# Patient Record
Sex: Female | Born: 1990 | Race: White | Hispanic: No | Marital: Married | Smoking: Former smoker
Health system: Southern US, Community
[De-identification: ages and names within clinical notes are randomized; demographics above are authoritative.]

## PROBLEM LIST (undated history)

## (undated) ENCOUNTER — Inpatient Hospital Stay: Payer: Self-pay

## (undated) ENCOUNTER — Ambulatory Visit: Admission: EM | Payer: Medicaid Other | Source: Home / Self Care

## (undated) DIAGNOSIS — E079 Disorder of thyroid, unspecified: Secondary | ICD-10-CM

## (undated) DIAGNOSIS — N3010 Interstitial cystitis (chronic) without hematuria: Secondary | ICD-10-CM

## (undated) DIAGNOSIS — K219 Gastro-esophageal reflux disease without esophagitis: Secondary | ICD-10-CM

## (undated) DIAGNOSIS — I10 Essential (primary) hypertension: Secondary | ICD-10-CM

## (undated) DIAGNOSIS — Z803 Family history of malignant neoplasm of breast: Secondary | ICD-10-CM

## (undated) DIAGNOSIS — J039 Acute tonsillitis, unspecified: Secondary | ICD-10-CM

## (undated) DIAGNOSIS — B999 Unspecified infectious disease: Secondary | ICD-10-CM

## (undated) DIAGNOSIS — F419 Anxiety disorder, unspecified: Secondary | ICD-10-CM

## (undated) DIAGNOSIS — K2900 Acute gastritis without bleeding: Secondary | ICD-10-CM

## (undated) DIAGNOSIS — F909 Attention-deficit hyperactivity disorder, unspecified type: Secondary | ICD-10-CM

## (undated) DIAGNOSIS — R51 Headache: Secondary | ICD-10-CM

## (undated) DIAGNOSIS — N189 Chronic kidney disease, unspecified: Secondary | ICD-10-CM

## (undated) DIAGNOSIS — D649 Anemia, unspecified: Secondary | ICD-10-CM

## (undated) DIAGNOSIS — R519 Headache, unspecified: Secondary | ICD-10-CM

## (undated) DIAGNOSIS — E041 Nontoxic single thyroid nodule: Secondary | ICD-10-CM

## (undated) DIAGNOSIS — T7840XA Allergy, unspecified, initial encounter: Secondary | ICD-10-CM

## (undated) HISTORY — DX: Disorder of thyroid, unspecified: E07.9

## (undated) HISTORY — DX: Family history of malignant neoplasm of breast: Z80.3

## (undated) HISTORY — PX: BREAST SURGERY: SHX581

## (undated) HISTORY — PX: WISDOM TOOTH EXTRACTION: SHX21

## (undated) HISTORY — DX: Anemia, unspecified: D64.9

## (undated) HISTORY — DX: Allergy, unspecified, initial encounter: T78.40XA

## (undated) HISTORY — DX: Interstitial cystitis (chronic) without hematuria: N30.10

## (undated) HISTORY — DX: Acute gastritis without bleeding: K29.00

---
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
## 2010-09-11 ENCOUNTER — Emergency Department: Payer: Self-pay | Admitting: Unknown Physician Specialty

## 2010-12-19 ENCOUNTER — Emergency Department: Payer: Self-pay | Admitting: Emergency Medicine

## 2011-02-08 ENCOUNTER — Ambulatory Visit: Payer: Self-pay | Admitting: Internal Medicine

## 2011-02-16 ENCOUNTER — Emergency Department: Payer: Self-pay | Admitting: Emergency Medicine

## 2011-07-10 ENCOUNTER — Observation Stay: Payer: Self-pay

## 2011-07-11 ENCOUNTER — Inpatient Hospital Stay: Payer: Self-pay

## 2011-07-11 ENCOUNTER — Observation Stay: Payer: Self-pay

## 2012-07-30 ENCOUNTER — Emergency Department: Payer: Self-pay | Admitting: *Deleted

## 2012-07-30 LAB — URINALYSIS, COMPLETE
Ketone: NEGATIVE
Ph: 7 (ref 4.5–8.0)
Protein: NEGATIVE
RBC,UR: NONE SEEN /HPF (ref 0–5)

## 2012-07-31 LAB — CBC
HCT: 38 % (ref 35.0–47.0)
HGB: 12.6 g/dL (ref 12.0–16.0)
MCHC: 33.1 g/dL (ref 32.0–36.0)
Platelet: 207 10*3/uL (ref 150–440)
RBC: 4.59 10*6/uL (ref 3.80–5.20)

## 2012-07-31 LAB — COMPREHENSIVE METABOLIC PANEL
Albumin: 3.9 g/dL (ref 3.4–5.0)
Alkaline Phosphatase: 64 U/L (ref 50–136)
BUN: 8 mg/dL (ref 7–18)
Bilirubin,Total: 0.4 mg/dL (ref 0.2–1.0)
Calcium, Total: 8.7 mg/dL (ref 8.5–10.1)
Chloride: 105 mmol/L (ref 98–107)
Creatinine: 0.72 mg/dL (ref 0.60–1.30)
EGFR (African American): >60
Glucose: 102 mg/dL — ABNORMAL HIGH (ref 65–99)
SGOT(AST): 28 U/L (ref 15–37)
SGPT (ALT): 20 U/L (ref 12–78)
Total Protein: 7.7 g/dL (ref 6.4–8.2)

## 2012-11-18 ENCOUNTER — Emergency Department: Payer: Self-pay | Admitting: Emergency Medicine

## 2012-11-18 ENCOUNTER — Ambulatory Visit: Payer: Self-pay | Admitting: Medical

## 2012-11-18 LAB — URINALYSIS, COMPLETE

## 2012-11-18 LAB — COMPREHENSIVE METABOLIC PANEL
Albumin: 4.6 g/dL (ref 3.4–5.0)
Anion Gap: 10 (ref 7–16)
Bilirubin,Total: 0.6 mg/dL (ref 0.2–1.0)
Calcium, Total: 9 mg/dL (ref 8.5–10.1)
Creatinine: 0.69 mg/dL (ref 0.60–1.30)
EGFR (Non-African Amer.): >60
Glucose: 118 mg/dL — ABNORMAL HIGH (ref 65–99)
Osmolality: 276 (ref 275–301)
Potassium: 3.3 mmol/L — ABNORMAL LOW (ref 3.5–5.1)
Sodium: 139 mmol/L (ref 136–145)
Total Protein: 8.6 g/dL — ABNORMAL HIGH (ref 6.4–8.2)

## 2012-11-18 LAB — CBC
HCT: 40.7 % (ref 35.0–47.0)
MCH: 28.4 pg (ref 26.0–34.0)
Platelet: 216 10*3/uL (ref 150–440)
RBC: 4.83 10*6/uL (ref 3.80–5.20)
WBC: 7.6 10*3/uL (ref 3.6–11.0)

## 2012-11-18 LAB — PREGNANCY, URINE: Pregnancy Test, Urine: NEGATIVE m[IU]/mL

## 2012-11-18 LAB — CK TOTAL AND CKMB (NOT AT ARMC): CK, Total: 199 U/L (ref 21–215)

## 2012-11-18 LAB — TROPONIN I: Troponin-I: <0.02 ng/mL

## 2013-01-26 ENCOUNTER — Ambulatory Visit: Payer: Self-pay | Admitting: Emergency Medicine

## 2013-01-26 LAB — RAPID STREP-A WITH REFLX: Micro Text Report: NEGATIVE

## 2013-01-26 LAB — RAPID INFLUENZA A&B ANTIGENS

## 2013-01-28 LAB — BETA STREP CULTURE(ARMC)

## 2013-03-20 ENCOUNTER — Emergency Department: Payer: Self-pay | Admitting: Emergency Medicine

## 2014-06-25 ENCOUNTER — Encounter (HOSPITAL_COMMUNITY): Payer: Self-pay | Admitting: Emergency Medicine

## 2014-06-25 ENCOUNTER — Emergency Department (HOSPITAL_COMMUNITY)
Admission: EM | Admit: 2014-06-25 | Discharge: 2014-06-25 | Disposition: A | Discharge status: Home / Self Care | Payer: Self-pay | Attending: Emergency Medicine | Admitting: Emergency Medicine

## 2014-06-25 ENCOUNTER — Emergency Department (HOSPITAL_COMMUNITY): Payer: Self-pay

## 2014-06-25 DIAGNOSIS — M940 Chondrocostal junction syndrome [Tietze]: Secondary | ICD-10-CM | POA: Insufficient documentation

## 2014-06-25 DIAGNOSIS — Z3202 Encounter for pregnancy test, result negative: Secondary | ICD-10-CM | POA: Insufficient documentation

## 2014-06-25 DIAGNOSIS — F411 Generalized anxiety disorder: Secondary | ICD-10-CM | POA: Insufficient documentation

## 2014-06-25 DIAGNOSIS — R109 Unspecified abdominal pain: Secondary | ICD-10-CM | POA: Insufficient documentation

## 2014-06-25 DIAGNOSIS — Z79899 Other long term (current) drug therapy: Secondary | ICD-10-CM | POA: Insufficient documentation

## 2014-06-25 HISTORY — DX: Anxiety disorder, unspecified: F41.9

## 2014-06-25 LAB — COMPREHENSIVE METABOLIC PANEL
ALBUMIN: 4.2 g/dL (ref 3.5–5.2)
ALT: 24 U/L (ref 0–35)
ANION GAP: 14 (ref 5–15)
AST: 26 U/L (ref 0–37)
Alkaline Phosphatase: 61 U/L (ref 39–117)
BUN: 12 mg/dL (ref 6–23)
CALCIUM: 9.6 mg/dL (ref 8.4–10.5)
CO2: 24 mEq/L (ref 19–32)
CREATININE: 0.69 mg/dL (ref 0.50–1.10)
Chloride: 99 mEq/L (ref 96–112)
GFR calc Af Amer: >90 mL/min (ref >90)
GFR calc non Af Amer: >90 mL/min (ref >90)
Glucose, Bld: 79 mg/dL (ref 70–99)
Potassium: 4.2 mEq/L (ref 3.7–5.3)
Sodium: 137 mEq/L (ref 137–147)
TOTAL PROTEIN: 7.8 g/dL (ref 6.0–8.3)
Total Bilirubin: 0.6 mg/dL (ref 0.3–1.2)

## 2014-06-25 LAB — URINE MICROSCOPIC-ADD ON

## 2014-06-25 LAB — CBC WITH DIFFERENTIAL/PLATELET
BASOS ABS: 0 10*3/uL (ref 0.0–0.1)
BASOS PCT: 0 % (ref 0–1)
EOS ABS: 0.2 10*3/uL (ref 0.0–0.7)
Eosinophils Relative: 2 % (ref 0–5)
HEMATOCRIT: 40.1 % (ref 36.0–46.0)
Hemoglobin: 13.4 g/dL (ref 12.0–15.0)
LYMPHS ABS: 2 10*3/uL (ref 0.7–4.0)
LYMPHS PCT: 25 % (ref 12–46)
MCH: 29.2 pg (ref 26.0–34.0)
MCHC: 33.4 g/dL (ref 30.0–36.0)
MCV: 87.4 fL (ref 78.0–100.0)
MONO ABS: 0.6 10*3/uL (ref 0.1–1.0)
Monocytes Relative: 7 % (ref 3–12)
Neutro Abs: 5.2 10*3/uL (ref 1.7–7.7)
Neutrophils Relative %: 66 % (ref 43–77)
Platelets: 312 10*3/uL (ref 150–400)
RBC: 4.59 MIL/uL (ref 3.87–5.11)
RDW: 13.4 % (ref 11.5–15.5)
WBC: 8 10*3/uL (ref 4.0–10.5)

## 2014-06-25 LAB — POC URINE PREG, ED: PREG TEST UR: NEGATIVE

## 2014-06-25 LAB — I-STAT TROPONIN, ED: TROPONIN I, POC: 0 ng/mL (ref 0.00–0.08)

## 2014-06-25 LAB — URINALYSIS, ROUTINE W REFLEX MICROSCOPIC
GLUCOSE, UA: NEGATIVE mg/dL
Hgb urine dipstick: NEGATIVE
KETONES UR: NEGATIVE mg/dL
NITRITE: POSITIVE — AB
Protein, ur: NEGATIVE mg/dL
Specific Gravity, Urine: 1.022 (ref 1.005–1.030)
Urobilinogen, UA: 1 mg/dL (ref 0.0–1.0)
pH: 6 (ref 5.0–8.0)

## 2014-06-25 MED ORDER — KETOROLAC TROMETHAMINE 30 MG/ML IJ SOLN
30.0000 mg | Freq: Once | INTRAMUSCULAR | Status: AC
  Administered 2014-06-25: 30 mg via INTRAVENOUS
  Filled 2014-06-25: qty 1

## 2014-06-25 MED ORDER — HYDROCODONE-ACETAMINOPHEN 5-325 MG PO TABS: 1.0000 | ORAL_TABLET | Freq: Four times a day (QID) | ORAL | Status: DC | PRN

## 2014-06-25 MED ORDER — NITROFURANTOIN MONOHYD MACRO 100 MG PO CAPS: 100.0000 mg | ORAL_CAPSULE | Freq: Two times a day (BID) | ORAL | Status: DC

## 2014-06-25 MED ORDER — IBUPROFEN 800 MG PO TABS: 800.0000 mg | ORAL_TABLET | Freq: Three times a day (TID) | ORAL | Status: DC | PRN

## 2014-06-25 NOTE — ED Provider Notes (Signed)
CSN: [id number]     Arrival date & time 06/25/14  1017 History   First MD Initiated Contact with Patient 06/25/14 1119     Chief Complaint  Patient presents with  . pain with inhalation    . Abdominal Pain     (Consider location/radiation/quality/duration/timing/severity/associated sxs/prior Treatment) HPI Patient presents to the emergency department with pain with deep inhalation and coughing over the last 2 months.  The patient, states, that she has also had worsening over the last 2 weeks.  Patient, states, that nothing seems to make her condition, better.  Patient, states she's not having his radiating pain patient denies nausea, vomiting, weakness, dizziness, headache, back pain, neck pain, fever, runny nose, sore throat, vaginal discharge, vaginal bleeding, blurred vision, rash, or syncope.  The patient, states, that she did not take any medications prior to arrival Past Medical History  Diagnosis Date  . Anxiety    History reviewed. No pertinent past surgical history. No family history on file. History  Substance Use Topics  . Smoking status: Never Smoker   . Smokeless tobacco: Not on file  . Alcohol Use: No   OB History   Grav Para Term Preterm Abortions TAB SAB Ect Mult Living                 Review of Systems All other systems negative except as documented in the HPI. All pertinent positives and negatives as reviewed in the HPI.   Allergies  Review of patient's allergies indicates no known allergies.  Home Medications   Prior to Admission medications   Medication Sig Start Date End Date Taking? Authorizing Provider  ALPRAZolam Prudy Feeler(XANAX) 0.5 MG tablet Take 0.5-1 mg by mouth at bedtime as needed for anxiety.   Yes Historical Provider, MD  amphetamine-dextroamphetamine (ADDERALL) 15 MG tablet Take 15 mg by mouth 2 (two) times daily.   Yes Historical Provider, MD  ibuprofen (ADVIL,MOTRIN) 800 MG tablet Take 800 mg by mouth every 8 (eight) hours as needed for moderate  pain.   Yes Historical Provider, MD   BP 115/69  Pulse 73  Temp(Src) 98.1 F (36.7 C) (Oral)  Resp 16  SpO2 100%  LMP 06/11/2014 Physical Exam  Nursing note and vitals reviewed. Constitutional: She is oriented to person, place, and time. She appears well-developed and well-nourished. No distress.  HENT:  Head: Normocephalic and atraumatic.  Mouth/Throat: Oropharynx is clear and moist.  Eyes: Pupils are equal, round, and reactive to light.  Cardiovascular: Normal rate, regular rhythm and normal heart sounds.  Exam reveals no gallop and no friction rub.   No murmur heard. Pulmonary/Chest: Effort normal and breath sounds normal. No respiratory distress.  Abdominal: Soft. Bowel sounds are normal. She exhibits no distension. There is no tenderness.  Neurological: She is alert and oriented to person, place, and time.  Skin: Skin is warm and dry. No rash noted. No erythema.    ED Course  Procedures (including critical care time) Labs Review Labs Reviewed  URINALYSIS, ROUTINE W REFLEX MICROSCOPIC - Abnormal; Notable for the following:    Color, Urine ORANGE (*)    APPearance CLOUDY (*)    Bilirubin Urine SMALL (*)    Nitrite POSITIVE (*)    Leukocytes, UA SMALL (*)    All other components within normal limits  URINE MICROSCOPIC-ADD ON - Abnormal; Notable for the following:    Squamous Epithelial / LPF MANY (*)    Bacteria, UA FEW (*)    All other components within normal limits  CBC WITH DIFFERENTIAL  COMPREHENSIVE METABOLIC PANEL  I-STAT TROPOININ, ED  POC URINE PREG, ED    Imaging Review Dg Chest 2 View  06/25/2014   CLINICAL DATA:  Mid chest pain with inspiration.  Smoking history.  EXAM: CHEST  2 VIEW  COMPARISON:  None.  FINDINGS: Heart and mediastinal shadows are normal. The lungs are clear. The vascularity is normal. No effusions. No bony abnormalities.  IMPRESSION: Normal chest   Electronically Signed   By: Paulina FusiMark  Shogry M.D.   On: 06/25/2014 12:23     EKG  Interpretation   Date/Time:  Friday June 25 2014 10:44:11 EDT Ventricular Rate:  68 PR Interval:  145 QRS Duration: 81 QT Interval:  389 QTC Calculation: 414 R Axis:   82 Text Interpretation:  Sinus rhythm No previous ECGs available Confirmed by  RANCOUR  MD, STEPHEN [phone]([id number]) on 06/25/2014 11:03:07 AM      Patient had one month's worth of midsternal chest discomfort.  Will treat the patient for costochondritis-type scenario.  Told to return here as needed.  Advised followup with a primary care Dr. patient is PERC negative and low-risk, based on Wells criteria    Carlyle DollyChristopher W Yehudah Standing, PA-C 06/25/14 1427

## 2014-06-25 NOTE — Progress Notes (Signed)
  CARE MANAGEMENT ED NOTE 06/25/2014  Patient:  Milus GlazierSCHECTERMAN,Jessyca F   Account Number:  [id number]  Date Initiated:  06/25/2014  Documentation initiated by:  Radford PaxFERRERO,Kosei Rhodes  Subjective/Objective Assessment:   Patient presents to Ed with pain upon deep inhalation     Subjective/Objective Assessment Detail:     Action/Plan:   Action/Plan Detail:   Anticipated DC Date:  06/25/2014     Status Recommendation to Physician:   Result of Recommendation:    Other ED Services  Consult Working Plan    DC Planning Services  Other  PCP issues    Choice offered to / List presented to:            Status of service:  Completed, signed off  ED Comments:   ED Comments Detail:  EDCM spoke to patient at bedside.  [place].  [facility]  [facility] informed [facility] that walk ins are welcome from 9am-1030am Mon-Thurs.  Patient informed that she may establish care there, speak to a Manufacturing systems engineerfinacial counselor, Child psychotherapistsocial worker and [facility] the [facility].  EDCM alos provided patient with list of pcps who accept self pay patients, list of discounted pharmacies and websites needymeds.org and Good https://figueroa.info/X.com for medication assistance, phone number to inquire about Affordable Care Act and Medicaid for insurance, [place] resources in [place] such[place] as local churches and [employer], and dental assistance for patients without insurance.  Patient thankful for resources.  No further EDCM needs at this time.

## 2014-06-25 NOTE — ED Provider Notes (Signed)
Medical screening examination/treatment/procedure(s) were performed by non-physician practitioner and as supervising physician I was immediately available for consultation/collaboration.  Becka Lagasse L Mirelle Biskup, MD 06/25/14 1628 

## 2014-06-25 NOTE — Discharge Instructions (Signed)
Return here as needed.  Followup with a primary care doctor your testing here today was normal.  Use ice and heat on your chest

## 2014-06-25 NOTE — ED Notes (Signed)
Per pt, sates pain with deep inhalation, states she has history of anxiety-also having lower abdominal pain, no dysuria

## 2014-07-01 ENCOUNTER — Encounter (HOSPITAL_COMMUNITY): Payer: Self-pay | Admitting: *Deleted

## 2014-07-01 ENCOUNTER — Inpatient Hospital Stay (HOSPITAL_COMMUNITY)
Admission: AD | Admit: 2014-07-01 | Discharge: 2014-07-01 | Disposition: A | Discharge status: Home / Self Care | Payer: Self-pay | Source: Ambulatory Visit | Attending: Obstetrics & Gynecology | Admitting: Obstetrics & Gynecology

## 2014-07-01 DIAGNOSIS — R1031 Right lower quadrant pain: Secondary | ICD-10-CM | POA: Insufficient documentation

## 2014-07-01 DIAGNOSIS — N390 Urinary tract infection, site not specified: Secondary | ICD-10-CM | POA: Insufficient documentation

## 2014-07-01 LAB — URINALYSIS, ROUTINE W REFLEX MICROSCOPIC
Bilirubin Urine: NEGATIVE
Glucose, UA: NEGATIVE mg/dL
Hgb urine dipstick: NEGATIVE
Ketones, ur: NEGATIVE mg/dL
Nitrite: POSITIVE — AB
PROTEIN: NEGATIVE mg/dL
SPECIFIC GRAVITY, URINE: 1.01 (ref 1.005–1.030)
UROBILINOGEN UA: 1 mg/dL (ref 0.0–1.0)
pH: 7.5 (ref 5.0–8.0)

## 2014-07-01 LAB — URINE MICROSCOPIC-ADD ON

## 2014-07-01 LAB — POCT PREGNANCY, URINE: PREG TEST UR: NEGATIVE

## 2014-07-01 MED ORDER — CIPROFLOXACIN HCL 500 MG PO TABS: 500.0000 mg | ORAL_TABLET | Freq: Two times a day (BID) | ORAL | Status: DC

## 2014-07-01 NOTE — MAU Note (Signed)
Sharp stabbing pain in RLQ for a couple wks.  [facility].  Now is starting to have shooting pains into back.  Pain is making her nauseated.

## 2014-07-01 NOTE — MAU Provider Note (Signed)
History     CSN: [id number]  Arrival date and time: 07/01/14 1143   None     Chief Complaint  Patient presents with  . Abdominal Pain   HPI This is a 23 y.o. female who presents with RLQ pain.  States has been there for 2 weeks. Denies fever or abnormal bleeding. Denies vomiting or diarrhea.  RN Note:  Sharp stabbing pain in RLQ for a couple wks. Went to [facility] for it. Now is starting to have shooting pains into back. Pain is making her nauseated.       OB History   Grav Para Term Preterm Abortions TAB SAB Ect Mult Living   1 1 1       1       Past Medical History  Diagnosis Date  . Anxiety     Past Surgical History  Procedure Laterality Date  . Cesarean section      History reviewed. No pertinent family history.  History  Substance Use Topics  . Smoking status: Never Smoker   . Smokeless tobacco: Not on file  . Alcohol Use: No    Allergies: No Known Allergies  Prescriptions prior to admission  Medication Sig Dispense Refill  . ALPRAZolam (XANAX) 0.5 MG tablet Take 0.5-1 mg by mouth at bedtime as needed for anxiety.      Marland Kitchen amphetamine-dextroamphetamine (ADDERALL) 15 MG tablet Take 15 mg by mouth 2 (two) times daily.      Marland Kitchen HYDROcodone-acetaminophen (NORCO/VICODIN) 5-325 MG per tablet Take 1 tablet by mouth every 6 (six) hours as needed for moderate pain.  15 tablet  0  . ibuprofen (ADVIL,MOTRIN) 800 MG tablet Take 800 mg by mouth every 8 (eight) hours as needed for moderate pain.      Marland Kitchen ibuprofen (ADVIL,MOTRIN) 800 MG tablet Take 1 tablet (800 mg total) by mouth every 8 (eight) hours as needed.  30 tablet  0  . nitrofurantoin, macrocrystal-monohydrate, (MACROBID) 100 MG capsule Take 1 capsule (100 mg total) by mouth 2 (two) times daily.  10 capsule  0    Review of Systems  Constitutional: Negative for fever, chills and malaise/fatigue.  Gastrointestinal: Positive for nausea and abdominal pain. Negative for vomiting, diarrhea and constipation.  Genitourinary:  Negative for dysuria, urgency and frequency.  Neurological: Negative for dizziness.   Physical Exam   Blood pressure 114/63, pulse 61, temperature 98.2 F (36.8 C), temperature source Oral, resp. rate 18, height 5' 3.5" (1.613 m), weight 55.792 kg (123 lb), last menstrual period 06/11/2014.  Physical Exam  Constitutional: She is oriented to person, place, and time. She appears well-developed and well-nourished. No distress.  HENT:  Head: Normocephalic.  Cardiovascular: Normal rate.   Respiratory: Effort normal.  GI: Soft. She exhibits no distension and no mass. There is tenderness (Mild RLQ). There is no rebound and no guarding.  Genitourinary: No vaginal discharge found.  Declines pelvic exam and wants to defer STD testing to H[facility]pt due to cost   Musculoskeletal: Normal range of motion.  Neurological: She is alert and oriented to person, place, and time.  Skin: Skin is warm and dry.  Psychiatric: She has a normal mood and affect.    MAU Course  Procedures  MDM Discussed probable UTI.  Also discussed cannot rule out STD, appy, or ovarian cyst. Offered eval for those. Decided to defer STD testing to [facility]pt and wants to wait on [place] due to cost. Results for orders placed during the hospital encounter of 07/01/14 (from the past 72  hour(s))  URINALYSIS, ROUTINE W REFLEX MICROSCOPIC     Status: Abnormal   Collection Time    07/01/14 11:55 AM      Result Value Ref Range   Color, Urine YELLOW  YELLOW   APPearance CLEAR  CLEAR   Specific Gravity, Urine 1.010  1.005 - 1.030   pH 7.5  5.0 - 8.0   Glucose, UA NEGATIVE  NEGATIVE mg/dL   Hgb urine dipstick NEGATIVE  NEGATIVE   Bilirubin Urine NEGATIVE  NEGATIVE   Ketones, ur NEGATIVE  NEGATIVE mg/dL   Protein, ur NEGATIVE  NEGATIVE mg/dL   Urobilinogen, UA 1.0  0.0 - 1.0 mg/dL   Nitrite POSITIVE (*) NEGATIVE   Leukocytes, UA TRACE (*) NEGATIVE  URINE MICROSCOPIC-ADD ON     Status: Abnormal   Collection Time    07/01/14 11:55 AM       Result Value Ref Range   Squamous Epithelial / LPF FEW (*) RARE   WBC, UA 0-2  <3 WBC/hpf   RBC / HPF 0-2  <3 RBC/hpf   Bacteria, UA RARE  RARE  POCT PREGNANCY, URINE     Status: None   Collection Time    07/01/14 11:57 AM      Result Value Ref Range   Preg Test, Ur NEGATIVE  NEGATIVE   Comment:            THE SENSITIVITY OF THIS     METHODOLOGY IS >24 mIU/mL    Assessment and Plan  A:  UTI P:  Discussed options       Rx Cipro for presumed UTI       Followup in [facility]pt or here PRN  [facility] Center[facility]ILLIAMS,Jarquavious Fentress 07/01/2014, 12:02 PM

## 2014-07-01 NOTE — Discharge Instructions (Signed)

## 2014-08-19 ENCOUNTER — Emergency Department (HOSPITAL_COMMUNITY): Payer: Self-pay

## 2014-08-19 ENCOUNTER — Emergency Department (HOSPITAL_COMMUNITY)
Admission: EM | Admit: 2014-08-19 | Discharge: 2014-08-19 | Disposition: A | Discharge status: Home / Self Care | Payer: Self-pay | Attending: Emergency Medicine | Admitting: Emergency Medicine

## 2014-08-19 DIAGNOSIS — Z79899 Other long term (current) drug therapy: Secondary | ICD-10-CM | POA: Insufficient documentation

## 2014-08-19 DIAGNOSIS — Z3202 Encounter for pregnancy test, result negative: Secondary | ICD-10-CM | POA: Insufficient documentation

## 2014-08-19 DIAGNOSIS — J189 Pneumonia, unspecified organism: Secondary | ICD-10-CM

## 2014-08-19 DIAGNOSIS — N390 Urinary tract infection, site not specified: Secondary | ICD-10-CM | POA: Insufficient documentation

## 2014-08-19 DIAGNOSIS — Z792 Long term (current) use of antibiotics: Secondary | ICD-10-CM | POA: Insufficient documentation

## 2014-08-19 DIAGNOSIS — R000 Tachycardia, unspecified: Secondary | ICD-10-CM | POA: Insufficient documentation

## 2014-08-19 DIAGNOSIS — R05 Cough: Secondary | ICD-10-CM | POA: Insufficient documentation

## 2014-08-19 DIAGNOSIS — R059 Cough, unspecified: Secondary | ICD-10-CM | POA: Insufficient documentation

## 2014-08-19 DIAGNOSIS — J3489 Other specified disorders of nose and nasal sinuses: Secondary | ICD-10-CM | POA: Insufficient documentation

## 2014-08-19 DIAGNOSIS — J159 Unspecified bacterial pneumonia: Secondary | ICD-10-CM | POA: Insufficient documentation

## 2014-08-19 DIAGNOSIS — R079 Chest pain, unspecified: Secondary | ICD-10-CM | POA: Insufficient documentation

## 2014-08-19 DIAGNOSIS — F411 Generalized anxiety disorder: Secondary | ICD-10-CM | POA: Insufficient documentation

## 2014-08-19 DIAGNOSIS — IMO0001 Reserved for inherently not codable concepts without codable children: Secondary | ICD-10-CM | POA: Insufficient documentation

## 2014-08-19 LAB — URINE MICROSCOPIC-ADD ON

## 2014-08-19 LAB — URINALYSIS, ROUTINE W REFLEX MICROSCOPIC
GLUCOSE, UA: NEGATIVE mg/dL
KETONES UR: 15 mg/dL — AB
Nitrite: POSITIVE — AB
PH: 5 (ref 5.0–8.0)
Protein, ur: NEGATIVE mg/dL
SPECIFIC GRAVITY, URINE: 1.019 (ref 1.005–1.030)
Urobilinogen, UA: 2 mg/dL — ABNORMAL HIGH (ref 0.0–1.0)

## 2014-08-19 LAB — D-DIMER, QUANTITATIVE: D-Dimer, Quant: <0.27 ug/mL-FEU (ref 0.00–0.48)

## 2014-08-19 LAB — PREGNANCY, URINE: Preg Test, Ur: NEGATIVE

## 2014-08-19 MED ORDER — LEVOFLOXACIN 500 MG PO TABS
750.0000 mg | ORAL_TABLET | Freq: Once | ORAL | Status: AC
  Administered 2014-08-19: 750 mg via ORAL
  Filled 2014-08-19: qty 2

## 2014-08-19 MED ORDER — ONDANSETRON 4 MG PO TBDP
4.0000 mg | ORAL_TABLET | Freq: Once | ORAL | Status: AC
  Administered 2014-08-19: 4 mg via ORAL
  Filled 2014-08-19: qty 1

## 2014-08-19 MED ORDER — ONDANSETRON 8 MG PO TBDP: 8.0000 mg | ORAL_TABLET | Freq: Once | ORAL | Status: DC

## 2014-08-19 MED ORDER — ALBUTEROL SULFATE HFA 108 (90 BASE) MCG/ACT IN AERS: 1.0000 | INHALATION_SPRAY | Freq: Four times a day (QID) | RESPIRATORY_TRACT | Status: DC | PRN

## 2014-08-19 MED ORDER — LEVOFLOXACIN 750 MG PO TABS: 750.0000 mg | ORAL_TABLET | Freq: Every day | ORAL | Status: DC

## 2014-08-19 NOTE — ED Notes (Signed)
Since Sunday Pt reports generalized body aches, productive cough yellow sputum, chills, headaches, interstitial cystitis pain, and chest pain when coughing. Pain 9/10. Pt has been taking azo for bladder pain. Thermaflu, ibuprofen, and cough medicine with no relief.

## 2014-08-19 NOTE — Progress Notes (Signed)
[facility] Community Liaison Stacy, ° °Provided pt with a list of primary care resources to help patient establish a pcp.  °

## 2014-08-19 NOTE — ED Provider Notes (Signed)
CSN: [id number]     Arrival date & time 08/19/14  4098 History   First MD Initiated Contact with Patient 08/19/14 [phone]     Chief Complaint  Patient presents with  . Cough  . URI  . interstitial cystitis pain      (Consider location/radiation/quality/duration/timing/severity/associated sxs/prior Treatment) HPI Comments: Patient complains of a four-day history of cough productive of yellow mucous, chest pain with coughing, shortness of breath pain with deep breathing. She also has diffuse bodyaches. Denies any sick contacts or travel. She was around a bonfire and is worried she inhaled some smoke. She denies any chest pain with coughing or deep breathing. Denies any leg pain or leg swelling. No abdominal pain, nausea or vomiting. No fever or chills. She complains of some pain with urination and coughing and is worried this is from her interstitial cystitis. Denies any abdominal pain or back pain. She's been taking Azo without relief. She is not on any birth control.  The history is provided by the patient.    Past Medical History  Diagnosis Date  . Anxiety    Past Surgical History  Procedure Laterality Date  . Cesarean section     No family history on file. History  Substance Use Topics  . Smoking status: Never Smoker   . Smokeless tobacco: Not on file  . Alcohol Use: No   OB History   Grav Para Term Preterm Abortions TAB SAB Ect Mult Living   Review of Systems  Constitutional: Positive for activity change.  HENT: Positive for congestion and rhinorrhea.   Eyes: Negative for visual disturbance.  Respiratory: Positive for cough and chest tightness.   Cardiovascular: Positive for chest pain.  Gastrointestinal: Negative for nausea, vomiting and abdominal pain.  Genitourinary: Positive for dysuria.  Musculoskeletal: Positive for arthralgias and myalgias. Negative for back pain.  Skin: Negative for rash.  Neurological: Negative for dizziness, weakness,  light-headedness and headaches.  A complete 10 system review of systems was obtained and all systems are negative except as noted in the HPI and PMH.      Allergies  Review of patient's allergies indicates no known allergies.  Home Medications   Prior to Admission medications   Medication Sig Start Date End Date Taking? Authorizing Provider  ALPRAZolam Prudy Feeler) 0.5 MG tablet Take 0.5 mg by mouth 3 (three) times daily as needed for anxiety.    Yes Historical Provider, MD  amphetamine-dextroamphetamine (ADDERALL) 15 MG tablet Take 15 mg by mouth daily as needed (for adhd). Only takes while in school   Yes Historical Provider, MD  Chlorphen-Pseudoephed-APAP (ACTIFED COLD/SINUS PO) Take 1 tablet by mouth every 6 (six) hours as needed (for cold).   Yes Historical Provider, MD  ibuprofen (ADVIL,MOTRIN) 200 MG tablet Take 400 mg by mouth every 6 (six) hours as needed for moderate pain.   Yes Historical Provider, MD  Phenylephrine-Pheniramine-DM [facility] COLD & COUGH) 10-13-19 MG PACK Take 1 packet by mouth once.   Yes Historical Provider, MD  albuterol (PROVENTIL HFA;VENTOLIN HFA) 108 (90 BASE) MCG/ACT inhaler Inhale 1-2 puffs into the lungs every 6 (six) hours as needed for wheezing or shortness of breath. 08/19/14   Glynn Octave, MD  levofloxacin (LEVAQUIN) 750 MG tablet Take 1 tablet (750 mg total) by mouth daily. 08/19/14   Glynn Octave, MD   BP 141/88  Pulse 88  Temp(Src) 98.1 F (36.7 C) (Oral)  Resp 14  Ht   (1.626 m)  Wt 120 lb (54.432 kg)  BMI 20.59 kg/m2  SpO2 98%  LMP 08/02/2014 Physical Exam  Nursing note and vitals reviewed. Constitutional: She is oriented to person, place, and time. She appears well-developed and well-nourished. No distress.  HENT:  Head: Normocephalic and atraumatic.  Mouth/Throat: Oropharynx is clear and moist. No oropharyngeal exudate.  Erythematous oropharynx, no asymmetry  Eyes: Conjunctivae and EOM are normal. Pupils are equal, round, and  reactive to light.  Neck: Normal range of motion. Neck supple.  No meningismus.  Cardiovascular: Normal rate, normal heart sounds and intact distal pulses.   No murmur heard. Mild tachycardia  Pulmonary/Chest: Effort normal and breath sounds normal. No respiratory distress. She exhibits tenderness.  Abdominal: Soft. There is no tenderness. There is no rebound and no guarding.  No RLQ pain, no suprapubic pain.   Musculoskeletal: Normal range of motion. She exhibits no edema and no tenderness.  No CVAT  Neurological: She is alert and oriented to person, place, and time. No cranial nerve deficit. She exhibits normal muscle tone. Coordination normal.  No ataxia on finger to nose bilaterally. No pronator drift. 5/5 strength throughout. CN 2-12 intact. Negative Romberg. Equal grip strength. Sensation intact. Gait is normal.   Skin: Skin is warm.  Psychiatric: She has a normal mood and affect. Her behavior is normal.    ED Course  Procedures (including critical care time) Labs Review Labs Reviewed  URINALYSIS, ROUTINE W REFLEX MICROSCOPIC - Abnormal; Notable for the following:    Color, Urine RED (*)    APPearance CLOUDY (*)    Hgb urine dipstick MODERATE (*)    Bilirubin Urine SMALL (*)    Ketones, ur 15 (*)    Urobilinogen, UA 2.0 (*)    Nitrite POSITIVE (*)    Leukocytes, UA MODERATE (*)    All other components within normal limits  URINE MICROSCOPIC-ADD ON - Abnormal; Notable for the following:    Squamous Epithelial / LPF MANY (*)    Bacteria, UA MANY (*)    All other components within normal limits  URINE CULTURE  PREGNANCY, URINE  D-DIMER, QUANTITATIVE  I-STAT CHEM 8, ED    Imaging Review Dg Chest 2 View  08/19/2014   CLINICAL DATA:  Cough. Chest pain. Shortness of breath. Nonsmoker.  EXAM: CHEST  2 VIEW  COMPARISON:  06/25/2014  FINDINGS: New patchy airspace opacity is seen in the superior segment of the left lower lobe, suspicious for pneumonia. Right lung is clear. No  evidence of pleural effusion. Heart size is normal. No definite hilar or mediastinal mass identified.  IMPRESSION: New airspace disease in superior segment of left lower lobe, suspicious for pneumonia. Recommend chest radiographic followup in several weeks to confirm resolution.   Electronically Signed   By: Myles Rosenthal M.D.   On: 08/19/2014 08:40     EKG Interpretation   Date/Time:  Thursday August 19 2014 08:12:09 EDT Ventricular Rate:  84 PR Interval:  133 QRS Duration: 82 QT Interval:  354 QTC Calculation: 418 R Axis:   81 Text Interpretation:  Sinus rhythm Borderline T abnormalities, anterior  leads Baseline wander in lead(s) II V6 No significant change was found  Confirmed by Manus Gunning  MD, Leoma Folds [phone]) on 08/19/2014 8:14:25 AM      MDM   Final diagnoses:  CAP (community acquired pneumonia)  Urinary tract infection without hematuria, site unspecified   4 day history of cough, chest pain with coughing, bodyaches, chills, dysuria.  Mild tachycardia.  Chest x-ray shows new left-sided infiltrate suspicious for pneumonia. Urinalysis shows contamination with questionable infection. We'll send culture.  D-dimer negative. Patient ambulatory without desaturation. Levaquin should cover both pneumonia and possible UTI. Follow up with PCP. Return precautions discussed.  BP 141/88  Pulse 88  Temp(Src) 98.1 F (36.7 C) (Oral)  Resp 14  Ht  (1.626 m)  Wt 120 lb (54.432 kg)  BMI 20.59 kg/m2  SpO2 98%  LMP 08/02/2014    Glynn Octave, MD 08/19/14 1152

## 2014-08-19 NOTE — Discharge Instructions (Signed)
Pneumonia Return to the ED if you develop worsening chest pain, shortness of breath, or any other concerns. Pneumonia is an infection of the lungs.  CAUSES Pneumonia may be caused by bacteria or a virus. Usually, these infections are caused by breathing infectious particles into the lungs (respiratory tract). SIGNS AND SYMPTOMS   Cough.  Fever.  Chest pain.  Increased rate of breathing.  Wheezing.  Mucus production. DIAGNOSIS  If you have the common symptoms of pneumonia, your health care provider will typically confirm the diagnosis with a chest X-ray. The X-ray will show an abnormality in the lung (pulmonary infiltrate) if you have pneumonia. Other tests of your blood, urine, or sputum may be done to find the specific cause of your pneumonia. Your health care provider may also do tests (blood gases or pulse oximetry) to see how well your lungs are working. TREATMENT  Some forms of pneumonia may be spread to other people when you cough or sneeze. You may be asked to wear a mask before and during your exam. Pneumonia that is caused by bacteria is treated with antibiotic medicine. Pneumonia that is caused by the influenza virus may be treated with an antiviral medicine. Most other viral infections must run their course. These infections will not respond to antibiotics.  HOME CARE INSTRUCTIONS   Cough suppressants may be used if you are losing too much rest. However, coughing protects you by clearing your lungs. You should avoid using cough suppressants if you can.  Your health care provider may have prescribed medicine if he or she thinks your pneumonia is caused by bacteria or influenza. Finish your medicine even if you start to feel better.  Your health care provider may also prescribe an expectorant. This loosens the mucus to be coughed up.  Take medicines only as directed by your health care provider.  Do not smoke. Smoking is a common cause of bronchitis and can contribute to  pneumonia. If you are a smoker and continue to smoke, your cough may last several weeks after your pneumonia has cleared.  A cold steam vaporizer or humidifier in your room or home may help loosen mucus.  Coughing is often worse at night. Sleeping in a semi-upright position in a recliner or using a couple pillows under your head will help with this.  Get rest as you feel it is needed. Your body will usually let you know when you need to rest. PREVENTION A pneumococcal shot (vaccine) is available to prevent a common bacterial cause of pneumonia. This is usually suggested for:  People over 27 years old.  Patients on chemotherapy.  People with chronic lung problems, such as bronchitis or emphysema.  People with immune system problems. If you are over 65 or have a high risk condition, you may receive the pneumococcal vaccine if you have not received it before. In some countries, a routine influenza vaccine is also recommended. This vaccine can help prevent some cases of pneumonia.You may be offered the influenza vaccine as part of your care. If you smoke, it is time to quit. You may receive instructions on how to stop smoking. Your health care provider can provide medicines and counseling to help you quit. SEEK MEDICAL CARE IF: You have a fever. SEEK IMMEDIATE MEDICAL CARE IF:   Your illness becomes worse. This is especially true if you are elderly or weakened from any other disease.  You cannot control your cough with suppressants and are losing sleep.  You begin coughing up blood.  You develop pain which is getting worse or is uncontrolled with medicines.  Any of the symptoms which initially brought you in for treatment are getting worse rather than better.  You develop shortness of breath or chest pain. MAKE SURE YOU:   Understand these instructions.  Will watch your condition.  Will get help right away if you are not doing well or get worse. Document Released: 12/10/2005  Document Revised: 04/26/2014 Document Reviewed: 03/01/2011 [facility] Patient Information 2015 [place], [place]. This information is not intended to replace advice given to you by your health care provider. Make sure you discuss any questions you have with your health care provider.

## 2014-08-19 NOTE — ED Notes (Signed)
While ambulating patients o2 states is 98

## 2014-08-19 NOTE — ED Notes (Signed)
MD at bedside. 

## 2014-08-20 LAB — URINE CULTURE
COLONY COUNT: NO GROWTH
Culture: NO GROWTH

## 2014-10-26 ENCOUNTER — Encounter (HOSPITAL_COMMUNITY): Payer: Self-pay | Admitting: *Deleted

## 2014-11-10 ENCOUNTER — Inpatient Hospital Stay (HOSPITAL_COMMUNITY)
Admission: AD | Admit: 2014-11-10 | Discharge: 2014-11-10 | Disposition: A | Discharge status: Home / Self Care | Payer: Self-pay | Source: Ambulatory Visit | Attending: Family Medicine | Admitting: Family Medicine

## 2014-11-10 ENCOUNTER — Encounter (HOSPITAL_COMMUNITY): Payer: Self-pay | Admitting: *Deleted

## 2014-11-10 DIAGNOSIS — N898 Other specified noninflammatory disorders of vagina: Secondary | ICD-10-CM | POA: Insufficient documentation

## 2014-11-10 HISTORY — DX: Chronic kidney disease, unspecified: N18.9

## 2014-11-10 HISTORY — DX: Unspecified infectious disease: B99.9

## 2014-11-10 LAB — URINALYSIS, ROUTINE W REFLEX MICROSCOPIC
Bilirubin Urine: NEGATIVE
GLUCOSE, UA: NEGATIVE mg/dL
HGB URINE DIPSTICK: NEGATIVE
Ketones, ur: NEGATIVE mg/dL
Nitrite: NEGATIVE
PH: 6 (ref 5.0–8.0)
Protein, ur: NEGATIVE mg/dL
SPECIFIC GRAVITY, URINE: 1.025 (ref 1.005–1.030)
Urobilinogen, UA: 0.2 mg/dL (ref 0.0–1.0)

## 2014-11-10 LAB — POCT PREGNANCY, URINE: PREG TEST UR: NEGATIVE

## 2014-11-10 LAB — URINE MICROSCOPIC-ADD ON

## 2014-11-10 LAB — WET PREP, GENITAL
Clue Cells Wet Prep HPF POC: NONE SEEN
Trich, Wet Prep: NONE SEEN
WBC, Wet Prep HPF POC: NONE SEEN
YEAST WET PREP: NONE SEEN

## 2014-11-10 NOTE — MAU Note (Signed)
Pt to lobby to wait on results  

## 2014-11-10 NOTE — MAU Note (Signed)
Past wk has had a d/c, burns when she pees, d/c has an odor.

## 2014-11-10 NOTE — Discharge Instructions (Signed)
Your exams were normal.  The test that was preformed showed that you do not have a vaginal or bladder infection. Please follow up as necessary - if your symptoms become worse or change.

## 2014-11-10 NOTE — MAU Provider Note (Addendum)
History     CSN: [id number]  Arrival date and time: 11/10/14 1031   First Provider Initiated Contact with Patient 11/10/14 1146      Chief Complaint  Patient presents with  . Vaginal Discharge   HPI 23 year old G1 P1 who presents to the M each ear with vaginal discharge and followed her for 1 week. Symptoms have been worsening. No creating a provoking factors. She had similar symptoms several weeks ago and was diagnosed with bacterial vaginosis which resolved with treatment. She denies risk factors of surgery transmitted diseases and does not wish to be screen for gonorrhea, Chlamydia, HIV.  She denies pelvic pain.   OB History    Gravida Para Term Preterm AB TAB SAB Ectopic Multiple Living   1 1 1       1       Past Medical History  Diagnosis Date  . Anxiety   . Infection     UTI  . Chronic kidney disease     kidney reflux, problem more as child    Past Surgical History  Procedure Laterality Date  . Cesarean section      History reviewed. No pertinent family history.  History  Substance Use Topics  . Smoking status: Never Smoker   . Smokeless tobacco: Never Used  . Alcohol Use: Yes     Comment: occ    Allergies: No Known Allergies  Prescriptions prior to admission  Medication Sig Dispense Refill Last Dose  . ALPRAZolam (XANAX) 0.5 MG tablet Take 0.5 mg by mouth 3 (three) times daily as needed for anxiety.    11/09/2014 at Unknown time  . amphetamine-dextroamphetamine (ADDERALL) 15 MG tablet Take 15 mg by mouth daily as needed (for adhd). Only takes while in school   Past Week at Unknown time  . albuterol (PROVENTIL HFA;VENTOLIN HFA) 108 (90 BASE) MCG/ACT inhaler Inhale 1-2 puffs into the lungs every 6 (six) hours as needed for wheezing or shortness of breath. (Patient not taking: Reported on 11/10/2014) 1 Inhaler 0   . Chlorphen-Pseudoephed-APAP (ACTIFED COLD/SINUS PO) Take 1 tablet by mouth every 6 (six) hours as needed (for cold).   Past Week at Unknown time   . ibuprofen (ADVIL,MOTRIN) 200 MG tablet Take 400 mg by mouth every 6 (six) hours as needed for moderate pain.   08/18/2014 at 1630  . levofloxacin (LEVAQUIN) 750 MG tablet Take 1 tablet (750 mg total) by mouth daily. (Patient not taking: Reported on 11/10/2014) 5 tablet 0   . Phenylephrine-Pheniramine-DM (THERAFLU COLD & COUGH) 10-13-19 MG PACK Take 1 packet by mouth once.   08/18/2014 at Unknown time    Review of Systems  Constitutional: Negative for fever and chills.  Respiratory: Negative for shortness of breath and wheezing.   Gastrointestinal: Negative for nausea, vomiting and abdominal pain.  Genitourinary: Negative for dysuria, urgency and hematuria.   Physical Exam   Blood pressure 116/64, pulse 75, temperature 98.4 F (36.9 C), temperature source Oral, resp. rate 16, height 5\' 4"  (1.626 m), weight 125 lb (56.7 kg), last menstrual period 10/24/2014.  Physical Exam  Constitutional: She is oriented to person, place, and time. She appears well-developed and well-nourished.  Cardiovascular: Normal rate and regular rhythm.   Respiratory: Effort normal and breath sounds normal. No respiratory distress. She has no wheezes. She has no rales. She exhibits no tenderness.  GI: Soft. She exhibits no distension and no mass. There is no tenderness. There is no rebound and no guarding.  Genitourinary:  Patient declined exam  Neurological: She is alert and oriented to person, place, and time.  Skin: Skin is warm and dry. No rash noted. No erythema. No pallor.  Psychiatric: She has a normal mood and affect. Her behavior is normal. Judgment and thought content normal.   Although pt declined GU exam, patient instructed on how to collect wet prep on herself.  (there is good data supporting patient's collecting own swabs for wet prep).  MAU Course  Procedures  MDM I considered PID, Yeast vaginitis, trichomonas, BV. Wet prep indicates no infection.  Assessment and Plan   Problem List Items  Addressed This Visit    None    Visit Diagnoses    Vaginal discharge    -  Primary    Relevant Orders       Discharge patient      F/u prn - if symptoms do not improve, if they worsen, or if they change.   STINSON, JACOB JEHIEL 11/10/2014, 11:49 AM

## 2014-12-24 HISTORY — PX: BREAST ENHANCEMENT SURGERY: SHX7

## 2015-02-28 ENCOUNTER — Emergency Department: Payer: Self-pay | Admitting: Emergency Medicine

## 2015-04-21 ENCOUNTER — Ambulatory Visit
Admit: 2015-04-21 | Disposition: A | Discharge status: Home / Self Care | Payer: Self-pay | Attending: Family Medicine | Admitting: Family Medicine

## 2015-04-21 LAB — RAPID INFLUENZA A&B ANTIGENS

## 2015-08-16 ENCOUNTER — Emergency Department (HOSPITAL_COMMUNITY)
Admission: EM | Admit: 2015-08-16 | Discharge: 2015-08-16 | Disposition: A | Discharge status: Home / Self Care | Payer: Self-pay | Attending: Emergency Medicine | Admitting: Emergency Medicine

## 2015-08-16 ENCOUNTER — Encounter (HOSPITAL_COMMUNITY): Payer: Self-pay

## 2015-08-16 ENCOUNTER — Other Ambulatory Visit (HOSPITAL_COMMUNITY): Payer: Self-pay

## 2015-08-16 DIAGNOSIS — R1013 Epigastric pain: Secondary | ICD-10-CM | POA: Insufficient documentation

## 2015-08-16 DIAGNOSIS — R1031 Right lower quadrant pain: Secondary | ICD-10-CM | POA: Insufficient documentation

## 2015-08-16 DIAGNOSIS — R102 Pelvic and perineal pain: Secondary | ICD-10-CM

## 2015-08-16 DIAGNOSIS — Z9889 Other specified postprocedural states: Secondary | ICD-10-CM | POA: Insufficient documentation

## 2015-08-16 DIAGNOSIS — Z87440 Personal history of urinary (tract) infections: Secondary | ICD-10-CM | POA: Insufficient documentation

## 2015-08-16 DIAGNOSIS — F419 Anxiety disorder, unspecified: Secondary | ICD-10-CM | POA: Insufficient documentation

## 2015-08-16 DIAGNOSIS — N189 Chronic kidney disease, unspecified: Secondary | ICD-10-CM | POA: Insufficient documentation

## 2015-08-16 LAB — URINALYSIS, ROUTINE W REFLEX MICROSCOPIC
Bilirubin Urine: NEGATIVE
GLUCOSE, UA: NEGATIVE mg/dL
HGB URINE DIPSTICK: NEGATIVE
KETONES UR: NEGATIVE mg/dL
Leukocytes, UA: NEGATIVE
Nitrite: NEGATIVE
PH: 6 (ref 5.0–8.0)
PROTEIN: NEGATIVE mg/dL
Specific Gravity, Urine: 1.006 (ref 1.005–1.030)
Urobilinogen, UA: 0.2 mg/dL (ref 0.0–1.0)

## 2015-08-16 MED ORDER — IOHEXOL 300 MG/ML  SOLN: 50.0000 mL | Freq: Once | INTRAMUSCULAR | Status: DC | PRN

## 2015-08-16 MED ORDER — SODIUM CHLORIDE 0.9 % IV SOLN: INTRAVENOUS | Status: DC

## 2015-08-16 NOTE — ED Notes (Signed)
Pt from home states she has lower right and middle abdominal pain that started around 2200 last night. Pt states she had 1 episode of diarrhea last night and vomited two times last night. Pt thinks her nausea is secondary to her pain. Pt reports she is unable to pass gas, but has hyperactive bowel sounds.

## 2015-08-16 NOTE — ED Provider Notes (Signed)
CSN: [id number]     Arrival date & time 08/16/15  0602 History   First MD Initiated Contact with Patient 08/16/15 [phone]     Chief Complaint  Patient presents with  . Abdominal Pain     (Consider location/radiation/quality/duration/timing/severity/associated sxs/prior Treatment) HPI Comments: Patient here complaining of right lower quadrant abdominal pain that started last night. Pain is also located in her epigastric area and associated with nausea vomiting. One time we episode yesterday. No fever or chills. Denies any vaginal bleeding or discharge. No urinary symptoms. Symptoms persistent and nothing makes them better worse. No treatment use prior to arrival.  Patient is a 24 y.o. female presenting with abdominal pain. The history is provided by the patient.  Abdominal Pain   Past Medical History  Diagnosis Date  . Anxiety   . Infection     UTI  . Chronic kidney disease     kidney reflux, problem more as child   Past Surgical History  Procedure Laterality Date  . Cesarean section     History reviewed. No pertinent family history. Social History  Substance Use Topics  . Smoking status: Never Smoker   . Smokeless tobacco: Never Used  . Alcohol Use: Yes     Comment: occ   OB History    Gravida Para Term Preterm AB TAB SAB Ectopic Multiple Living   1 1 1       1      Review of Systems  Gastrointestinal: Positive for abdominal pain.  All other systems reviewed and are negative.     Allergies  Review of patient's allergies indicates no known allergies.  Home Medications   Prior to Admission medications   Medication Sig Start Date End Date Taking? Authorizing Provider  ALPRAZolam Prudy Feeler) 0.5 MG tablet Take 0.5 mg by mouth 3 (three) times daily as needed for anxiety.    Yes Historical Provider, MD  amphetamine-dextroamphetamine (ADDERALL) 15 MG tablet Take 15 mg by mouth daily as needed (for adhd). Only takes while in school   Yes Historical Provider, MD  albuterol  (PROVENTIL HFA;VENTOLIN HFA) 108 (90 BASE) MCG/ACT inhaler Inhale 1-2 puffs into the lungs every 6 (six) hours as needed for wheezing or shortness of breath. Patient not taking: Reported on 11/10/2014 08/19/14   Glynn Octave, MD   BP 116/80 mmHg  Pulse 73  Temp(Src) 97.5 F (36.4 C) (Oral)  Resp 20  SpO2 98%  LMP 08/09/2015 Physical Exam  Constitutional: She is oriented to person, place, and time. She appears well-developed and well-nourished.  Non-toxic appearance. No distress.  HENT:  Head: Normocephalic and atraumatic.  Eyes: Conjunctivae, EOM and lids are normal. Pupils are equal, round, and reactive to light.  Neck: Normal range of motion. Neck supple. No tracheal deviation present. No thyroid mass present.  Cardiovascular: Normal rate, regular rhythm and normal heart sounds.  Exam reveals no gallop.   No murmur heard. Pulmonary/Chest: Effort normal and breath sounds normal. No stridor. No respiratory distress. She has no decreased breath sounds. She has no wheezes. She has no rhonchi. She has no rales.  Abdominal: Soft. Normal appearance and bowel sounds are normal. She exhibits no distension. There is tenderness in the right lower quadrant and epigastric area. There is no rigidity, no rebound, no guarding and no CVA tenderness.    Musculoskeletal: Normal range of motion. She exhibits no edema or tenderness.  Neurological: She is alert and oriented to person, place, and time. She has normal strength. No cranial nerve deficit or  sensory deficit. GCS eye subscore is 4. GCS verbal subscore is 5. GCS motor subscore is 6.  Skin: Skin is warm and dry. No abrasion and no rash noted.  Psychiatric: She has a normal mood and affect. Her speech is normal and behavior is normal.  Nursing note and vitals reviewed.   ED Course  Procedures (including critical care time) Labs Review Labs Reviewed  CBC WITH DIFFERENTIAL/PLATELET  URINALYSIS, ROUTINE W REFLEX MICROSCOPIC (NOT AT [facility])    LIPASE, BLOOD  COMPREHENSIVE METABOLIC PANEL  I-STAT BETA HCG BLOOD, ED ([facility], [facility], [facility] ONLY)  GC/CHLAMYDIA PROBE AMP ([place]) NOT AT [facility]    Imaging Review No results found. I have personally reviewed and evaluated these images and lab results as part of my medical decision-making.   EKG Interpretation None      MDM   Final diagnoses:  None    Patient deferred pelvic exam. Will obtain abdominal CT to rule out appendicitis  7:53 AM Patient declines further workup at this time. Does not want blood work or abdominal CT. Explained to patient risks of possible appendicitis versus other severe intra-abdominal pathology. Patient would still like to leave at this time and return precautions given and patient instructed that she is feeling return should she change her mind.    Lorre Nick, MD 08/16/15 [phone]

## 2015-08-16 NOTE — ED Notes (Signed)
Patient states she is not having any labs or test done...klj

## 2015-08-16 NOTE — ED Notes (Signed)
Labs and CT delayed due patient's indecisiveness regarding the test. Dr. Freida Busman made aware. Purpose of the tests based on patient complaint was explained in detail by Dr. Freida Busman with RN at bedside including possible risks. Patient is declining care at this time.

## 2015-08-16 NOTE — Discharge Instructions (Signed)
Abdominal Pain, Women °Abdominal (stomach, pelvic, or belly) pain can be caused by many things. It is important to tell your doctor: °· The location of the pain. °· Does it come and go or is it present all the time? °· Are there things that start the pain (eating certain foods, exercise)? °· Are there other symptoms associated with the pain (fever, nausea, vomiting, diarrhea)? °All of this is helpful to know when trying to find the cause of the pain. °CAUSES  °· Stomach: virus or bacteria infection, or ulcer. °· Intestine: appendicitis (inflamed appendix), regional ileitis (Crohn's disease), ulcerative colitis (inflamed colon), irritable bowel syndrome, diverticulitis (inflamed diverticulum of the colon), or cancer of the stomach or intestine. °· Gallbladder disease or stones in the gallbladder. °· Kidney disease, kidney stones, or infection. °· Pancreas infection or cancer. °· Fibromyalgia (pain disorder). °· Diseases of the female organs: °¨ Uterus: fibroid (non-cancerous) tumors or infection. °¨ Fallopian tubes: infection or tubal pregnancy. °¨ Ovary: cysts or tumors. °¨ Pelvic adhesions (scar tissue). °¨ Endometriosis (uterus lining tissue growing in the pelvis and on the pelvic organs). °¨ Pelvic congestion syndrome (female organs filling up with blood just before the menstrual period). °¨ Pain with the menstrual period. °¨ Pain with ovulation (producing an egg). °¨ Pain with an IUD (intrauterine device, birth control) in the uterus. °¨ Cancer of the female organs. °· Functional pain (pain not caused by a disease, may improve without treatment). °· Psychological pain. °· Depression. °DIAGNOSIS  °Your doctor will decide the seriousness of your pain by doing an examination. °· Blood tests. °· X-rays. °· Ultrasound. °· CT scan (computed tomography, special type of X-ray). °· MRI (magnetic resonance imaging). °· Cultures, for infection. °· Barium enema (dye inserted in the large intestine, to better view it with  X-rays). °· Colonoscopy (looking in intestine with a lighted tube). °· Laparoscopy (minor surgery, looking in abdomen with a lighted tube). °· Major abdominal exploratory surgery (looking in abdomen with a large incision). °TREATMENT  °The treatment will depend on the cause of the pain.  °· Many cases can be observed and treated at home. °· Over-the-counter medicines recommended by your caregiver. °· Prescription medicine. °· Antibiotics, for infection. °· Birth control pills, for painful periods or for ovulation pain. °· Hormone treatment, for endometriosis. °· Nerve blocking injections. °· Physical therapy. °· Antidepressants. °· Counseling with a psychologist or psychiatrist. °· Minor or major surgery. °HOME CARE INSTRUCTIONS  °· Do not take laxatives, unless directed by your caregiver. °· Take over-the-counter pain medicine only if ordered by your caregiver. Do not take aspirin because it can cause an upset stomach or bleeding. °· Try a clear liquid diet (broth or water) as ordered by your caregiver. Slowly move to a bland diet, as tolerated, if the pain is related to the stomach or intestine. °· Have a thermometer and take your temperature several times a day, and record it. °· Bed rest and sleep, if it helps the pain. °· Avoid sexual intercourse, if it causes pain. °· Avoid stressful situations. °· Keep your follow-up appointments and tests, as your caregiver orders. °· If the pain does not go away with medicine or surgery, you may try: °¨ Acupuncture. °¨ Relaxation exercises (yoga, meditation). °¨ Group therapy. °¨ Counseling. °SEEK MEDICAL CARE IF:  °· You notice certain foods cause stomach pain. °· Your home care treatment is not helping your pain. °· You need stronger pain medicine. °· You want your IUD removed. °· You feel faint or   lightheaded. °· You develop nausea and vomiting. °· You develop a rash. °· You are having side effects or an allergy to your medicine. °SEEK IMMEDIATE MEDICAL CARE IF:  °· Your  pain does not go away or gets worse. °· You have a fever. °· Your pain is felt only in portions of the abdomen. The right side could possibly be appendicitis. The left lower portion of the abdomen could be colitis or diverticulitis. °· You are passing blood in your stools (bright red or black tarry stools, with or without vomiting). °· You have blood in your urine. °· You develop chills, with or without a fever. °· You pass out. °MAKE SURE YOU:  °· Understand these instructions. °· Will watch your condition. °· Will get help right away if you are not doing well or get worse. °Document Released: 10/07/2007 Document Revised: 04/26/2014 Document Reviewed: 10/27/2009 °[employer]® Patient Information ©2015 [employer]. This information is not intended to replace advice given to you by your health care provider. Make sure you discuss any questions you have with your health care provider. ° °

## 2015-08-16 NOTE — ED Notes (Signed)
Went to set up the pelvic exam but pt stated she wants to refuse it. Stated she just recently had one.

## 2015-08-16 NOTE — ED Notes (Signed)
Pt complains of right lower abd pain since last night, she vomited once and had diarrhea once

## 2016-05-04 ENCOUNTER — Encounter: Payer: Self-pay | Admitting: Family Medicine

## 2016-05-04 ENCOUNTER — Ambulatory Visit (INDEPENDENT_AMBULATORY_CARE_PROVIDER_SITE_OTHER): Payer: Medicaid Other | Admitting: Family Medicine

## 2016-05-04 VITALS — BP 104/70 | HR 93 | Temp 97.8°F | Ht 65.0 in | Wt 150.8 lb

## 2016-05-04 DIAGNOSIS — R946 Abnormal results of thyroid function studies: Secondary | ICD-10-CM

## 2016-05-04 DIAGNOSIS — R7989 Other specified abnormal findings of blood chemistry: Secondary | ICD-10-CM | POA: Insufficient documentation

## 2016-05-04 DIAGNOSIS — J069 Acute upper respiratory infection, unspecified: Secondary | ICD-10-CM | POA: Insufficient documentation

## 2016-05-04 DIAGNOSIS — M545 Low back pain, unspecified: Secondary | ICD-10-CM | POA: Insufficient documentation

## 2016-05-04 DIAGNOSIS — F411 Generalized anxiety disorder: Secondary | ICD-10-CM | POA: Insufficient documentation

## 2016-05-04 LAB — TSH: TSH: 0.44 u[IU]/mL (ref 0.35–4.50)

## 2016-05-04 NOTE — Assessment & Plan Note (Signed)
Patient reports a history of low TSH level in the past. Has gained 5 pounds in the last year. Does note some heat intolerance. We'll recheck a TSH today.

## 2016-05-04 NOTE — Assessment & Plan Note (Signed)
Likely muscular cause though has been persistent. It seem unlikely to be a DDD or an arthritic cause in a 25 year old. Neurologically intact in lower extremities. No red flags. Discussed ibuprofen with patient. Given exercises. Discussed PT though cost is a concern for her so she will do exercises at home. Given return precautions.

## 2016-05-04 NOTE — Progress Notes (Signed)
Patient ID: Lydia Kennedy, female   DOB: May 24, 1991, 25 y.o.   MRN: [id number]  Marikay AlarEric Derwin Reddy, MD Phone: [phone]  Lydia Kennedy is a 25 y.o. female who presents today for new patient visit.   Left ear pain: Patient notes 1 week of left ear discomfort some sinus and nasal congestion and rhinorrhea with postnasal drip. No fevers. No cough. No right ear pain. She feels well overall.  Anxiety/panic attacks: Followed by a psychiatrist for this. Notes baseline anxiety. She notes she feels as though she needs to gasp for breath when she feels anxious. Notes if she takes a real deep breath she gets a central achy chest discomfort. She also notes some palpitations with this. This has been going on for greater than 1 year. She is treated with Xanax twice daily and then also as needed. She notes the Xanax helps with her symptoms. No history of hypertension or diabetes. Possible history of hyperlipidemia. No family history of cardiac disease. She does note the symptoms can occur when she is physically active but also occur at rest. No history of DVT or PE. No recent inactivity. No depression. No SI.  Thyroid dysfunction: Patient notes in the last year she's gained 5 pounds. She notes recently her TSH was checked and she states it was a full point low. No constipation. Does note dry skin. Some heat intolerance. Some palpitations. No family history of thyroid dysfunction.  She also notes intermittent low back pain. Worse with physical activity. No radiation down her legs. No numbness or weakness. No loss of bowel or bladder function, saddle anesthesia, fevers, or history of cancer. Notes this has been an issue since she delivered her breast reduction surgery last year.  Active Ambulatory Problems    Diagnosis Date Noted  . Generalized anxiety disorder 05/04/2016  . Acute upper respiratory infection 05/04/2016  . Low TSH level 05/04/2016  . Low back pain 05/04/2016   Resolved Ambulatory  Problems    Diagnosis Date Noted  . No Resolved Ambulatory Problems   Past Medical History  Diagnosis Date  . Anxiety   . Infection   . Chronic kidney disease   . Thyroid dysfunction     Family History  Problem Relation Age of Onset  . Breast cancer Mother     Social History   Social History  . Marital Status: Single    Spouse Name: N/A  . Number of Children: N/A  . Years of Education: N/A   Occupational History  . Not on file.   Social History Main Topics  . Smoking status: Never Smoker   . Smokeless tobacco: Never Used  . Alcohol Use: 3.0 oz/week    5 Standard drinks or equivalent per week     Comment: occ  . Drug Use: No  . Sexual Activity: Yes    Birth Control/ Protection: None   Other Topics Concern  . Not on file   Social History Narrative    ROS  General:  Negative for nexplained weight loss, fever Skin: Negative for new or changing mole, sore that won't heal HEENT: Negative for trouble hearing, trouble seeing, ringing in ears, mouth sores, hoarseness, change in voice, dysphagia. CV:  Positive for chest pain, palpitations, negative for edema Resp: Positive for dyspnea, Negative for cough, dyspnea, hemoptysis GI: Negative for nausea, vomiting, diarrhea, constipation, abdominal pain, melena, hematochezia. GU: Negative for dysuria, incontinence, urinary hesitance, hematuria, vaginal or penile discharge, polyuria, sexual difficulty, lumps in testicle or breasts MSK: Negative for  muscle cramps or aches, joint pain or swelling Neuro: Negative for headaches, weakness, numbness, dizziness, passing out/fainting Psych: Positive for anxiety, Negative for depression, memory problems  Objective  Physical Exam Filed Vitals:   05/04/16 1327  BP: 104/70  Pulse: 93  Temp: 97.8 F (36.6 C)    BP Readings from Last 3 Encounters:  05/04/16 104/70  08/16/15 116/80  11/10/14 115/66   Wt Readings from Last 3 Encounters:  05/04/16 150 lb 12.8 oz (68.402 kg)    11/10/14 125 lb (56.7 kg)  08/19/14 120 lb (54.432 kg)    Physical Exam  Constitutional: She is well-developed, well-nourished, and in no distress.  HENT:  Head: Normocephalic and atraumatic.  Right Ear: External ear normal.  Left Ear: External ear normal.  Mouth/Throat: Oropharynx is clear and moist. No oropharyngeal exudate.  Normal TMs bilaterally  Eyes: Conjunctivae are normal. Pupils are equal, round, and reactive to light.  Neck: Neck supple.  Cardiovascular: Normal rate, regular rhythm and normal heart sounds.  Exam reveals no gallop and no friction rub.   No murmur heard. Pulmonary/Chest: Effort normal and breath sounds normal.  Abdominal: Soft. Bowel sounds are normal. She exhibits no distension. There is no tenderness. There is no rebound and no guarding.  Musculoskeletal:  No midline spine tenderness, no midline spine step-off, no muscular back tenderness  Lymphadenopathy:    She has no cervical adenopathy.  Neurological: She is alert. Gait normal.  5 out of 5 strength bilateral quads, hamstrings, plantar flexion, and dorsiflexion, sensation to light touch intact bilateral extremity, 2+ patellar reflexes  Skin: Skin is warm and dry. She is not diaphoretic.  Psychiatric:  Mood anxious, affect anxious     Assessment/Plan:   Generalized anxiety disorder Patient with significant anxiety and panic attacks. Also noted having so depression. She is followed by psychiatry for this. Suspect the episodes of feeling as though she needs to take a deep breath and palpitations are related to her anxiety. I did discuss obtaining an EKG to evaluate for cardiac cause though she was concerned with cost and opted not to do this today. She did note minimally elevated cholesterol the past though no other risk factors for cardiac disease. Much less likely to be related to cardiac issue and a 25 year old female with minimal risk factors. She wants to treat her anxiety through her psychiatrist  and I advised that she discuss with them SSRIs. She'll continue to monitor. If any changes or worsening she will let us know. She is given return precautions.  Acute upper respiratory infection Suspect left ear discomfort and congestion is likely related to a viral illness versus allergic rhinitis. Discussed Flonase and Zyrtec. She'll continue to monitor. Given return precautions.  Low TSH level Patient reports a history of low TSH level in the past. Has gained 5 pounds in the last year. Does note some heat intolerance. We'll recheck a TSH today.  Low back pain Likely muscular cause though has been persistent. It seem unlikely to be a DDD or an arthritic cause in a 25 year old. Neurologically intact in lower extremities. No red flags. Discussed ibuprofen with patient. Given exercises. Discussed PT though cost is a concern for her so she will do exercises at home. Given return precautions.    Orders Placed This Encounter  Procedures  . TSH   Marikay Alar, MD [facility]

## 2016-05-04 NOTE — Progress Notes (Signed)
Pre visit review using our clinic review tool, if applicable. No additional management support is needed unless otherwise documented below in the visit note. 

## 2016-05-04 NOTE — Assessment & Plan Note (Signed)
Suspect left ear discomfort and congestion is likely related to a viral illness versus allergic rhinitis. Discussed Flonase and Zyrtec. She'll continue to monitor. Given return precautions.

## 2016-05-04 NOTE — Patient Instructions (Addendum)
Nice to meet you. Your congestion and ear discomfort is likely related to allergic rhinitis or viral illness. We will treat you with Flonase 2 sprays each nostril daily and over-the-counter Zyrtec. We will check her thyroid function as well. Please follow-up with your psychiatrist to discuss chronic anti-anxiety medication. If you develop persistent chest pain, persistent shortness of breath, persistent palpitations, worsening anxiety, depression, thoughts of harming herself or others, fevers, numbness, weakness, loss of bowel or bladder function, numbness between her legs, or any new or changing symptoms please seek medical attention.  Back Exercises If you have pain in your back, do these exercises 2-3 times each day or as told by your doctor. When the pain goes away, do the exercises once each day, but repeat the steps more times for each exercise (do more repetitions). If you do not have pain in your back, do these exercises once each day or as told by your doctor. EXERCISES Single Knee to Chest Do these steps 3-5 times in a row for each leg: 1. Lie on your back on a firm bed or the floor with your legs stretched out. 2. Bring one knee to your chest. 3. Hold your knee to your chest by grabbing your knee or thigh. 4. Pull on your knee until you feel a gentle stretch in your lower back. 5. Keep doing the stretch for 10-30 seconds. 6. Slowly let go of your leg and straighten it. Pelvic Tilt Do these steps 5-10 times in a row: 1. Lie on your back on a firm bed or the floor with your legs stretched out. 2. Bend your knees so they point up to the ceiling. Your feet should be flat on the floor. 3. Tighten your lower belly (abdomen) muscles to press your lower back against the floor. This will make your tailbone point up to the ceiling instead of pointing down to your feet or the floor. 4. Stay in this position for 5-10 seconds while you gently tighten your muscles and breathe evenly. Cat-Cow Do  these steps until your lower back bends more easily: 1. Get on your hands and knees on a firm surface. Keep your hands under your shoulders, and keep your knees under your hips. You may put padding under your knees. 2. Let your head hang down, and make your tailbone point down to the floor so your lower back is round like the back of a cat. 3. Stay in this position for 5 seconds. 4. Slowly lift your head and make your tailbone point up to the ceiling so your back hangs low (sags) like the back of a cow. 5. Stay in this position for 5 seconds. Press-Ups Do these steps 5-10 times in a row: 1. Lie on your belly (face-down) on the floor. 2. Place your hands near your head, about shoulder-width apart. 3. While you keep your back relaxed and keep your hips on the floor, slowly straighten your arms to raise the top half of your body and lift your shoulders. Do not use your back muscles. To make yourself more comfortable, you may change where you place your hands. 4. Stay in this position for 5 seconds. 5. Slowly return to lying flat on the floor. Bridges Do these steps 10 times in a row: 1. Lie on your back on a firm surface. 2. Bend your knees so they point up to the ceiling. Your feet should be flat on the floor. 3. Tighten your butt muscles and lift your butt off of the  floor until your waist is almost as high as your knees. If you do not feel the muscles working in your butt and the back of your thighs, slide your feet 1-2 inches farther away from your butt. 4. Stay in this position for 3-5 seconds. 5. Slowly lower your butt to the floor, and let your butt muscles relax. If this exercise is too easy, try doing it with your arms crossed over your chest. Belly Crunches Do these steps 5-10 times in a row: 1. Lie on your back on a firm bed or the floor with your legs stretched out. 2. Bend your knees so they point up to the ceiling. Your feet should be flat on the floor. 3. Cross your arms over  your chest. 4. Tip your chin a little bit toward your chest but do not bend your neck. 5. Tighten your belly muscles and slowly raise your chest just enough to lift your shoulder blades a tiny bit off of the floor. 6. Slowly lower your chest and your head to the floor. Back Lifts Do these steps 5-10 times in a row: 1. Lie on your belly (face-down) with your arms at your sides, and rest your forehead on the floor. 2. Tighten the muscles in your legs and your butt. 3. Slowly lift your chest off of the floor while you keep your hips on the floor. Keep the back of your head in line with the curve in your back. Look at the floor while you do this. 4. Stay in this position for 3-5 seconds. 5. Slowly lower your chest and your face to the floor. GET HELP IF:  Your back pain gets a lot worse when you do an exercise.  Your back pain does not lessen 2 hours after you exercise. If you have any of these problems, stop doing the exercises. Do not do them again unless your doctor says it is okay. GET HELP RIGHT AWAY IF:  You have sudden, very bad back pain. If this happens, stop doing the exercises. Do not do them again unless your doctor says it is okay.   This information is not intended to replace advice given to you by your health care provider. Make sure you discuss any questions you have with your health care provider.   Document Released: 01/12/2011 Document Revised: 08/31/2015 Document Reviewed: 02/03/2015 Elsevier Interactive Patient Education [employer]

## 2016-05-04 NOTE — Assessment & Plan Note (Signed)
Patient with significant anxiety and panic attacks. Also noted having so depression. She is followed by psychiatry for this. Suspect the episodes of feeling as though she needs to take a deep breath and palpitations are related to her anxiety. I did discuss obtaining an EKG to evaluate for cardiac cause though she was concerned with cost and opted not to do this today. She did note minimally elevated cholesterol the past though no other risk factors for cardiac disease. Much less likely to be related to cardiac issue and a 25 year old female with minimal risk factors. She wants to treat her anxiety through her psychiatrist and I advised that she discuss with them SSRIs. She'll continue to monitor. If any changes or worsening she will let us know. She is given return precautions.

## 2016-05-06 ENCOUNTER — Emergency Department (HOSPITAL_COMMUNITY)
Admission: EM | Admit: 2016-05-06 | Discharge: 2016-05-06 | Disposition: A | Discharge status: Home / Self Care | Payer: Self-pay | Attending: Emergency Medicine | Admitting: Emergency Medicine

## 2016-05-06 ENCOUNTER — Encounter (HOSPITAL_COMMUNITY): Payer: Self-pay | Admitting: *Deleted

## 2016-05-06 ENCOUNTER — Emergency Department (HOSPITAL_COMMUNITY): Payer: Self-pay

## 2016-05-06 DIAGNOSIS — N189 Chronic kidney disease, unspecified: Secondary | ICD-10-CM | POA: Insufficient documentation

## 2016-05-06 DIAGNOSIS — R0789 Other chest pain: Secondary | ICD-10-CM | POA: Insufficient documentation

## 2016-05-06 DIAGNOSIS — Z79899 Other long term (current) drug therapy: Secondary | ICD-10-CM | POA: Insufficient documentation

## 2016-05-06 LAB — BASIC METABOLIC PANEL
Anion gap: 12 (ref 5–15)
BUN: 12 mg/dL (ref 6–20)
CALCIUM: 9.6 mg/dL (ref 8.9–10.3)
CO2: 24 mmol/L (ref 22–32)
CREATININE: 0.67 mg/dL (ref 0.44–1.00)
Chloride: 101 mmol/L (ref 101–111)
GFR calc non Af Amer: >60 mL/min (ref >60)
Glucose, Bld: 118 mg/dL — ABNORMAL HIGH (ref 65–99)
Potassium: 4.2 mmol/L (ref 3.5–5.1)
SODIUM: 137 mmol/L (ref 135–145)

## 2016-05-06 LAB — CBC WITH DIFFERENTIAL/PLATELET
BASOS ABS: 0 10*3/uL (ref 0.0–0.1)
BASOS PCT: 0 %
EOS ABS: 0.3 10*3/uL (ref 0.0–0.7)
Eosinophils Relative: 3 %
HCT: 39 % (ref 36.0–46.0)
HEMOGLOBIN: 13.5 g/dL (ref 12.0–15.0)
Lymphocytes Relative: 17 %
Lymphs Abs: 1.6 10*3/uL (ref 0.7–4.0)
MCH: 29.9 pg (ref 26.0–34.0)
MCHC: 34.6 g/dL (ref 30.0–36.0)
MCV: 86.3 fL (ref 78.0–100.0)
Monocytes Absolute: 0.9 10*3/uL (ref 0.1–1.0)
Monocytes Relative: 9 %
NEUTROS PCT: 71 %
Neutro Abs: 6.8 10*3/uL (ref 1.7–7.7)
Platelets: 275 10*3/uL (ref 150–400)
RBC: 4.52 MIL/uL (ref 3.87–5.11)
RDW: 12.2 % (ref 11.5–15.5)
WBC: 9.5 10*3/uL (ref 4.0–10.5)

## 2016-05-06 LAB — I-STAT TROPONIN, ED: TROPONIN I, POC: 0 ng/mL (ref 0.00–0.08)

## 2016-05-06 LAB — I-STAT BETA HCG BLOOD, ED (MC, WL, AP ONLY)

## 2016-05-06 LAB — D-DIMER, QUANTITATIVE: D-Dimer, Quant: <0.27 ug/mL-FEU (ref 0.00–0.50)

## 2016-05-06 NOTE — Discharge Instructions (Signed)
Ibuprofen 600 mg every 6 hours for the next 5 days.  Rest.  Follow-up with your primary Dr. if you are not improving in the next week, and return to the ER if symptoms significantly worsen or change.   Nonspecific Chest Pain  Chest pain can be caused by many different conditions. There is always a chance that your pain could be related to something serious, such as a heart attack or a blood clot in your lungs. Chest pain can also be caused by conditions that are not life-threatening. If you have chest pain, it is very important to follow up with your health care provider. CAUSES  Chest pain can be caused by:  Heartburn.  Pneumonia or bronchitis.  Anxiety or stress.  Inflammation around your heart (pericarditis) or lung (pleuritis or pleurisy).  A blood clot in your lung.  A collapsed lung (pneumothorax). It can develop suddenly on its own (spontaneous pneumothorax) or from trauma to the chest.  Shingles infection (varicella-zoster virus).  Heart attack.  Damage to the bones, muscles, and cartilage that make up your chest wall. This can include:  Bruised bones due to injury.  Strained muscles or cartilage due to frequent or repeated coughing or overwork.  Fracture to one or more ribs.  Sore cartilage due to inflammation (costochondritis). RISK FACTORS  Risk factors for chest pain may include:  Activities that increase your risk for trauma or injury to your chest.  Respiratory infections or conditions that cause frequent coughing.  Medical conditions or overeating that can cause heartburn.  Heart disease or family history of heart disease.  Conditions or health behaviors that increase your risk of developing a blood clot.  Having had chicken pox (varicella zoster). SIGNS AND SYMPTOMS Chest pain can feel like:  Burning or tingling on the surface of your chest or deep in your chest.  Crushing, pressure, aching, or squeezing pain.  Dull or sharp pain that is worse  when you move, cough, or take a deep breath.  Pain that is also felt in your back, neck, shoulder, or arm, or pain that spreads to any of these areas. Your chest pain may come and go, or it may stay constant. DIAGNOSIS Lab tests or other studies may be needed to find the cause of your pain. Your health care provider may have you take a test called an ambulatory ECG (electrocardiogram). An ECG records your heartbeat patterns at the time the test is performed. You may also have other tests, such as:  Transthoracic echocardiogram (TTE). During echocardiography, sound waves are used to create a picture of all of the heart structures and to look at how blood flows through your heart.  Transesophageal echocardiogram (TEE).This is a more advanced imaging test that obtains images from inside your body. It allows your health care provider to see your heart in finer detail.  Cardiac monitoring. This allows your health care provider to monitor your heart rate and rhythm in real time.  Holter monitor. This is a portable device that records your heartbeat and can help to diagnose abnormal heartbeats. It allows your health care provider to track your heart activity for several days, if needed.  Stress tests. These can be done through exercise or by taking medicine that makes your heart beat more quickly.  Blood tests.  Imaging tests. TREATMENT  Your treatment depends on what is causing your chest pain. Treatment may include:  Medicines. These may include:  Acid blockers for heartburn.  Anti-inflammatory medicine.  Pain medicine for  inflammatory conditions.  Antibiotic medicine, if an infection is present.  Medicines to dissolve blood clots.  Medicines to treat coronary artery disease.  Supportive care for conditions that do not require medicines. This may include:  Resting.  Applying heat or cold packs to injured areas.  Limiting activities until pain decreases. HOME CARE  INSTRUCTIONS  If you were prescribed an antibiotic medicine, finish it all even if you start to feel better.  Avoid any activities that bring on chest pain.  Do not use any tobacco products, including cigarettes, chewing tobacco, or electronic cigarettes. If you need help quitting, ask your health care provider.  Do not drink alcohol.  Take medicines only as directed by your health care provider.  Keep all follow-up visits as directed by your health care provider. This is important. This includes any further testing if your chest pain does not go away.  If heartburn is the cause for your chest pain, you may be told to keep your head raised (elevated) while sleeping. This reduces the chance that acid will go from your stomach into your esophagus.  Make lifestyle changes as directed by your health care provider. These may include:  Getting regular exercise. Ask your health care provider to suggest some activities that are safe for you.  Eating a heart-healthy diet. A registered dietitian can help you to learn healthy eating options.  Maintaining a healthy weight.  Managing diabetes, if necessary.  Reducing stress. SEEK MEDICAL CARE IF:  Your chest pain does not go away after treatment.  You have a rash with blisters on your chest.  You have a fever. SEEK IMMEDIATE MEDICAL CARE IF:   Your chest pain is worse.  You have an increasing cough, or you cough up blood.  You have severe abdominal pain.  You have severe weakness.  You faint.  You have chills.  You have sudden, unexplained chest discomfort.  You have sudden, unexplained discomfort in your arms, back, neck, or jaw.  You have shortness of breath at any time.  You suddenly start to sweat, or your skin gets clammy.  You feel nauseous or you vomit.  You suddenly feel light-headed or dizzy.  Your heart begins to beat quickly, or it feels like it is skipping beats. These symptoms may represent a serious  problem that is an emergency. Do not wait to see if the symptoms will go away. Get medical help right away. Call your local emergency services (911 in the [place] Do not drive yourself to the hospital.   This information is not intended to replace advice given to you by your health care provider. Make sure you discuss any questions you have with your health care provider.   Document Released: 09/19/2005 Document Revised: 12/31/2014 Document Reviewed: 07/16/2014 Elsevier Interactive Patient Education [employer].

## 2016-05-06 NOTE — ED Provider Notes (Signed)
CSN: [id number]     Arrival date & time 05/06/16  [id number] History   First MD Initiated Contact with Patient 05/06/16 [id number]     Chief Complaint  Patient presents with  . Shoulder Pain     (Consider location/radiation/quality/duration/timing/severity/associated sxs/prior Treatment) HPI Comments: Patient is a 25 year old female with history of anxiety and thyroid disease. She presents for evaluation of right shoulder pain that began several days ago in the absence of any injury or trauma. The pain radiates into the center of her chest and is worse when she takes a deep breath. She reports feeling short of breath but denies any fevers, chills, or productive cough. She denies any exertional symptoms. She does not currently take oral contraceptives. She denies any swelling or pain in her legs or calves.  Patient is a 25 y.o. female presenting with shoulder pain. The history is provided by the patient.  Shoulder Pain Upper extremity pain location: Right shoulder. Injury: no   Pain details:    Quality:  Sharp   Radiates to: Right chest.   Severity:  Moderate   Onset quality:  Sudden   Duration:  3 days   Timing:  Constant   Progression:  Worsening Chronicity:  New Prior injury to area:  No Relieved by:  Nothing Worsened by:  Nothing tried Ineffective treatments:  None tried Associated symptoms: no fever and no numbness     Past Medical History  Diagnosis Date  . Anxiety   . Infection     UTI  . Chronic kidney disease     kidney reflux, problem more as child  . Thyroid dysfunction    Past Surgical History  Procedure Laterality Date  . Cesarean section     Family History  Problem Relation Age of Onset  . Breast cancer Mother    Social History  Substance Use Topics  . Smoking status: Never Smoker   . Smokeless tobacco: Never Used  . Alcohol Use: 3.0 oz/week    5 Standard drinks or equivalent per week     Comment: occ   OB History    Gravida Para Term Preterm AB TAB SAB  Ectopic Multiple Living   1 1 1       1      Review of Systems  Constitutional: Negative for fever.  All other systems reviewed and are negative.     Allergies  Review of patient's allergies indicates no known allergies.  Home Medications   Prior to Admission medications   Medication Sig Start Date End Date Taking? Authorizing Provider  ALPRAZolam Prudy Feeler(XANAX) 0.5 MG tablet Take 0.5 mg by mouth 3 (three) times daily as needed for anxiety.    Yes Historical Provider, MD  amphetamine-dextroamphetamine (ADDERALL) 15 MG tablet Take 15 mg by mouth daily as needed (for adhd). Only takes while in school   Yes Historical Provider, MD   BP 123/74 mmHg  Pulse 99  Temp(Src) 98.1 F (36.7 C) (Oral)  Resp 18  Ht 5\' 5"  (1.651 m)  Wt 148 lb (67.132 kg)  BMI 24.63 kg/m2  SpO2 95%  LMP 04/05/2016 Physical Exam  Constitutional: She is oriented to person, place, and time. She appears well-developed and well-nourished. No distress.  HENT:  Head: Normocephalic and atraumatic.  Neck: Normal range of motion. Neck supple.  Cardiovascular: Normal rate and regular rhythm.  Exam reveals no gallop and no friction rub.   No murmur heard. Pulmonary/Chest: Effort normal and breath sounds normal. No respiratory distress. She has no wheezes.  Abdominal: Soft. Bowel sounds are normal. She exhibits no distension. There is no tenderness.  Musculoskeletal: Normal range of motion. She exhibits no edema.  There is no calf tenderness or swelling. Homans sign is absent bilaterally.  Neurological: She is alert and oriented to person, place, and time.  Skin: Skin is warm and dry. She is not diaphoretic.  Nursing note and vitals reviewed.   ED Course  Procedures (including critical care time) Labs Review Labs Reviewed  BASIC METABOLIC PANEL  CBC WITH DIFFERENTIAL/PLATELET  D-DIMER, QUANTITATIVE (NOT AT [facility])  I-STAT TROPOININ, ED    Imaging Review No results found. I have personally reviewed and evaluated  these images and lab results as part of my medical decision-making.   EKG Interpretation   Date/Time:  Sunday May 06 2016 06:34:37 EDT Ventricular Rate:  96 PR Interval:  153 QRS Duration: 82 QT Interval:  336 QTC Calculation: 425 R Axis:   85 Text Interpretation:  Sinus rhythm Normal ECG Confirmed by Nazarene Bunning  MD,  Mairen Wallenstein ([id number]) on 05/06/2016 7:49:09 AM      MDM   Final diagnoses:  None    Patient's workup is unremarkable including EKG, troponin, laboratory studies, d-dimer, and chest x-ray. I highly doubt PE, cardiac issues, or other serious pathology. There is no hypoxia and she appears very comfortable. I highly suspect a musculoskeletal etiology. She will be treated with anti-inflammatories and advised to follow-up with her primary Dr. if not improving.    Geoffery Lyons, MD 05/06/16 [phone]

## 2016-05-06 NOTE — ED Notes (Signed)
Pt states that she is having rt shoulder pain that is radiating to her chest; pt states that it hurts in her chest to take a deep breath; pt denies injury to shoulder; pt states that she saw her PCP and was advised that if it got worse to come to the hospital

## 2016-05-07 ENCOUNTER — Telehealth: Payer: Self-pay | Admitting: Family Medicine

## 2016-05-07 ENCOUNTER — Encounter: Payer: Self-pay | Admitting: Family Medicine

## 2016-05-07 NOTE — Telephone Encounter (Signed)
Pt called to check the status for her lab results. Lab was done on 05/04/16. Call pt @ [phone]. Thank you!

## 2016-05-07 NOTE — Telephone Encounter (Signed)
Patient's TSH is normal.

## 2016-05-07 NOTE — Telephone Encounter (Signed)
Spoke with patient and verbalized lab results. thanks

## 2016-05-07 NOTE — Telephone Encounter (Signed)
Please advise results thanks 

## 2016-08-03 ENCOUNTER — Ambulatory Visit: Payer: Medicaid Other | Admitting: Family Medicine

## 2016-10-26 ENCOUNTER — Other Ambulatory Visit
Admission: EM | Admit: 2016-10-26 | Discharge: 2016-10-26 | Disposition: A | Discharge status: Home / Self Care | Attending: Family Medicine | Admitting: Family Medicine

## 2017-05-10 DIAGNOSIS — J029 Acute pharyngitis, unspecified: Secondary | ICD-10-CM | POA: Insufficient documentation

## 2017-05-10 DIAGNOSIS — Z5321 Procedure and treatment not carried out due to patient leaving prior to being seen by health care provider: Secondary | ICD-10-CM | POA: Insufficient documentation

## 2017-05-10 NOTE — ED Triage Notes (Addendum)
Pt presents with c/o throat tightness and soreness. Pt states she was dx with strep and started penicillin on Monday. Pt believes allergic reaction started on Tuesday. Pt in NAD at this time. Pt states no difficulty breathing or swallowing at this time.

## 2017-05-11 ENCOUNTER — Emergency Department
Admission: EM | Admit: 2017-05-11 | Discharge: 2017-05-11 | Disposition: A | Discharge status: Home / Self Care | Attending: Emergency Medicine | Admitting: Emergency Medicine

## 2017-06-24 ENCOUNTER — Ambulatory Visit: Admitting: Gastroenterology

## 2017-06-25 ENCOUNTER — Encounter: Payer: Self-pay | Admitting: Gastroenterology

## 2017-06-25 ENCOUNTER — Ambulatory Visit: Admitting: Gastroenterology

## 2017-09-08 ENCOUNTER — Ambulatory Visit (INDEPENDENT_AMBULATORY_CARE_PROVIDER_SITE_OTHER): Payer: Self-pay

## 2017-09-08 ENCOUNTER — Ambulatory Visit (HOSPITAL_COMMUNITY)
Admission: EM | Admit: 2017-09-08 | Discharge: 2017-09-08 | Disposition: A | Discharge status: Home / Self Care | Payer: Self-pay | Attending: Family Medicine | Admitting: Family Medicine

## 2017-09-08 ENCOUNTER — Encounter (HOSPITAL_COMMUNITY): Payer: Self-pay | Admitting: Emergency Medicine

## 2017-09-08 DIAGNOSIS — J069 Acute upper respiratory infection, unspecified: Secondary | ICD-10-CM

## 2017-09-08 DIAGNOSIS — B9789 Other viral agents as the cause of diseases classified elsewhere: Secondary | ICD-10-CM

## 2017-09-08 MED ORDER — MONTELUKAST SODIUM 10 MG PO TABS: 10.0000 mg | ORAL_TABLET | Freq: Every day | ORAL | 0 refills | Status: DC

## 2017-09-08 MED ORDER — HYDROCODONE-HOMATROPINE 5-1.5 MG/5ML PO SYRP: 5.0000 mL | ORAL_SOLUTION | Freq: Four times a day (QID) | ORAL | 0 refills | Status: DC | PRN

## 2017-09-08 NOTE — ED Triage Notes (Signed)
Pt here for cold sx onset 2 weeks  Did an E-visit on 9/9 and 9/13  Was given Cipro 500 mg given for a sinus inf, ventolin  Sx today include cough, ST, chest tightness, bilateral ear fullness  Smokes 3 cigs/day  A&O x4... NAD... Ambulatory

## 2017-09-08 NOTE — ED Provider Notes (Signed)
[facility]    CSN: [id number] Arrival date & time: 09/08/17  1851     History   Chief Complaint Chief Complaint  Patient presents with  . URI    HPI Lydia Kennedy is a 26 y.o. female.   Pt here for cold sx onset 2 weeks with sinus congestion.  Originally on Ventolin, Ceftin, and prednisone.  No improvement and changed to Cipro three days ago.  Works as a Production assistant, radio.  Did an E-visit on 9/9 and 9/13  Was given Cipro 500 mg given for a sinus inf, ventolin  Sx today include cough, ST, chest tightness, bilateral ear fullness  Smokes 3 cigs/day  A&O x4... NAD... Ambulatory       Past Medical History:  Diagnosis Date  . Anxiety   . Chronic kidney disease    kidney reflux, problem more as child  . Infection    UTI  . Thyroid dysfunction     Patient Active Problem List   Diagnosis Date Noted  . Generalized anxiety disorder 05/04/2016  . Acute upper respiratory infection 05/04/2016  . Low TSH level 05/04/2016  . Low back pain 05/04/2016    Past Surgical History:  Procedure Laterality Date  . CESAREAN SECTION      OB History    Gravida Para Term Preterm AB Living   SAB TAB Ectopic Multiple Live Births           1       Home Medications    Prior to Admission medications   Medication Sig Start Date End Date Taking? Authorizing Provider  ALPRAZolam Prudy Feeler) 0.5 MG tablet Take 0.5 mg by mouth 3 (three) times daily as needed for anxiety.     [provider]  amphetamine-dextroamphetamine (ADDERALL) 15 MG tablet Take 15 mg by mouth daily as needed (for adhd). Only takes while in school    [provider]  HYDROcodone-homatropine (HYDROMET) 5-1.5 MG/5ML syrup Take 5 mLs by mouth every 6 (six) hours as needed for cough. 09/08/17   Elvina Sidle, MD  montelukast (SINGULAIR) 10 MG tablet Take 1 tablet (10 mg total) by mouth at bedtime. 09/08/17   Elvina Sidle, MD    Family History Family History    Problem Relation Age of Onset  . Breast cancer Mother     Social History Social History  Substance Use Topics  . Smoking status: Never Smoker  . Smokeless tobacco: Never Used  . Alcohol use 3.0 oz/week    5 Standard drinks or equivalent per week     Comment: occ     Allergies   Patient has no known allergies.   Review of Systems Review of Systems  HENT: Positive for ear pain and sore throat.   Respiratory: Positive for cough and chest tightness.   All other systems reviewed and are negative.    Physical Exam Triage Vital Signs ED Triage Vitals  Enc Vitals Group     BP 09/08/17 1915 (!) 132/92     Pulse Rate 09/08/17 1915 (!) 110     Resp 09/08/17 1915 20     Temp 09/08/17 1915 98.4 F (36.9 C)     Temp Source 09/08/17 1915 Oral     SpO2 09/08/17 1915 96 %     Weight --      Height --      Head Circumference --      Peak Flow --  Pain Score 09/08/17 1917 8     Pain Loc --      Pain Edu? --      Excl. in GC? --    No data found.   Updated Vital Signs BP (!) 132/92 (BP Location: Left Arm)   Pulse (!) 110   Temp 98.4 F (36.9 C) (Oral)   Resp 20   LMP 09/05/2017   SpO2 96%   Visual Acuity Right Eye Distance:   Left Eye Distance:   Bilateral Distance:    Right Eye Near:   Left Eye Near:    Bilateral Near:     Physical Exam  Constitutional: She is oriented to person, place, and time. She appears well-developed and well-nourished.  HENT:  Right Ear: External ear normal.  Left Ear: External ear normal.  Mouth/Throat: Oropharynx is clear and moist.  Eyes: Pupils are equal, round, and reactive to light. Conjunctivae are normal.  Neck: Normal range of motion. Neck supple.  Cardiovascular: Normal rate, regular rhythm and normal heart sounds.   Pulmonary/Chest: Effort normal and breath sounds normal.  Musculoskeletal: Normal range of motion.  Neurological: She is alert and oriented to person, place, and time.  Skin: Skin is warm and dry.   Nursing note and vitals reviewed.    UC Treatments / Results  Labs (all labs ordered are listed, but only abnormal results are displayed) Labs Reviewed - No data to display  EKG  EKG Interpretation None       Radiology No results found.  Procedures Procedures (including critical care time)  Medications Ordered in UC Medications - No data to display   Initial Impression / Assessment and Plan / UC Course  I have reviewed the triage vital signs and the nursing notes.  Pertinent labs & imaging results that were available during my care of the patient were reviewed by me and considered in my medical decision making (see chart for details).     Final Clinical Impressions(s) / UC Diagnoses   Final diagnoses:  Viral URI with cough    New Prescriptions New Prescriptions   HYDROCODONE-HOMATROPINE (HYDROMET) 5-1.5 MG/5ML SYRUP    Take 5 mLs by mouth every 6 (six) hours as needed for cough.   MONTELUKAST (SINGULAIR) 10 MG TABLET    Take 1 tablet (10 mg total) by mouth at bedtime.     Controlled Substance Prescriptions [place] Controlled Substance Registry consulted? Not Applicable   Elvina Sidle, MD 09/08/17 Corky Crafts

## 2017-09-08 NOTE — Discharge Instructions (Signed)
The chest x-ray shows no pneumonia.  Your symptoms and findings are most consistent with a viral syndrome including bronchitis.  The medicines prescribed should help control the symptoms while your body fights off the infection.  Get as much rest and fluids as possible.  Continue your current meds and pick up the new ones tonight.

## 2018-01-01 ENCOUNTER — Other Ambulatory Visit: Payer: Self-pay

## 2018-01-01 ENCOUNTER — Ambulatory Visit (HOSPITAL_COMMUNITY)
Admission: EM | Admit: 2018-01-01 | Discharge: 2018-01-01 | Disposition: A | Discharge status: Home / Self Care | Payer: BLUE CROSS/BLUE SHIELD | Attending: Family Medicine | Admitting: Family Medicine

## 2018-01-01 ENCOUNTER — Encounter (HOSPITAL_COMMUNITY): Payer: Self-pay | Admitting: Emergency Medicine

## 2018-01-01 DIAGNOSIS — J069 Acute upper respiratory infection, unspecified: Secondary | ICD-10-CM | POA: Diagnosis not present

## 2018-01-01 DIAGNOSIS — B9789 Other viral agents as the cause of diseases classified elsewhere: Secondary | ICD-10-CM | POA: Diagnosis not present

## 2018-01-01 MED ORDER — HYDROCODONE-HOMATROPINE 5-1.5 MG/5ML PO SYRP: 5.0000 mL | ORAL_SOLUTION | Freq: Four times a day (QID) | ORAL | 0 refills | Status: DC | PRN

## 2018-01-01 NOTE — ED Triage Notes (Signed)
Pt states "I woke up Sunday with a lot of congestion in my head, my ears have been hurting, and a bad cough all night".

## 2018-01-01 NOTE — ED Notes (Signed)
Patient discharged by provider.

## 2018-01-02 NOTE — ED Provider Notes (Signed)
  [facility]   [id number] 01/01/18 Arrival Time: 1526  ASSESSMENT & PLAN:  1. Viral URI with cough     Meds ordered this encounter  Medications  . HYDROcodone-homatropine (HYCODAN) 5-1.5 MG/5ML syrup    Sig: Take 5 mLs by mouth every 6 (six) hours as needed for cough.    Dispense:  90 mL    Refill:  0   Medication sedation precautions. OTC symptom care as needed. Ensure adequate fluid intake and rest. May f/u with PCP or here as needed.  Reviewed expectations re: course of current medical issues. Questions answered. Outlined signs and symptoms indicating need for more acute intervention. Patient verbalized understanding. After Visit Summary given.   SUBJECTIVE: History from: patient.  Lydia Kennedy is a 27 y.o. female who presents with complaint of nasal congestion, post-nasal drainage, and a persistent dry cough. Onset abrupt, approximately 1 day ago. Overall fatigued with body aches. SOB: none. Wheezing: none. Fever: yes, subjective. Overall normal PO intake without n/v. Sick contacts: no. OTC treatment: None.  Received flu shot this year: no. Social History   Tobacco Use  Smoking Status Never Smoker  Smokeless Tobacco Never Used    ROS: As per HPI.   OBJECTIVE:  Vitals:   01/01/18 1626  BP: 136/77  Pulse: 96  Resp: 14  Temp: (!) 97.3 F (36.3 C)  SpO2: 100%     General appearance: alert; appears fatigued HEENT: nasal congestion; clear runny nose; throat irritation secondary to post-nasal drainage Neck: supple without LAD Lungs: unlabored respirations, symmetrical air entry; cough: moderate; no respiratory distress Skin: warm and dry Psychological: alert and cooperative; normal mood and affect   Allergies  Allergen Reactions  . Penicillins     Past Medical History:  Diagnosis Date  . Anxiety   . Chronic kidney disease    kidney reflux, problem more as child  . Infection    UTI  . Thyroid dysfunction    Family History    Problem Relation Age of Onset  . Breast cancer Mother    Social History   Socioeconomic History  . Marital status: Single    Spouse name: Not on file  . Number of children: Not on file  . Years of education: Not on file  . Highest education level: Not on file  Social Needs  . Financial resource strain: Not on file  . Food insecurity - worry: Not on file  . Food insecurity - inability: Not on file  . Transportation needs - medical: Not on file  . Transportation needs - non-medical: Not on file  Occupational History  . Not on file  Tobacco Use  . Smoking status: Never Smoker  . Smokeless tobacco: Never Used  Substance and Sexual Activity  . Alcohol use: Yes    Alcohol/week: 3.0 oz    Types: 5 Standard drinks or equivalent per week    Comment: occ  . Drug use: No  . Sexual activity: Yes    Birth control/protection: None  Other Topics Concern  . Not on file  Social History Narrative  . Not on file           Mardella LaymanHagler, Zymere Patlan, MD 01/02/18 [phone]

## 2018-01-06 ENCOUNTER — Ambulatory Visit (HOSPITAL_COMMUNITY)
Admission: EM | Admit: 2018-01-06 | Discharge: 2018-01-06 | Disposition: A | Discharge status: Home / Self Care | Payer: BLUE CROSS/BLUE SHIELD | Attending: Internal Medicine | Admitting: Internal Medicine

## 2018-01-06 ENCOUNTER — Encounter (HOSPITAL_COMMUNITY): Payer: Self-pay | Admitting: Emergency Medicine

## 2018-01-06 DIAGNOSIS — J0140 Acute pansinusitis, unspecified: Secondary | ICD-10-CM | POA: Diagnosis not present

## 2018-01-06 MED ORDER — FLUTICASONE PROPIONATE 50 MCG/ACT NA SUSP: 2.0000 | Freq: Every day | NASAL | 0 refills | Status: DC

## 2018-01-06 MED ORDER — DOXYCYCLINE HYCLATE 100 MG PO CAPS: 100.0000 mg | ORAL_CAPSULE | Freq: Two times a day (BID) | ORAL | 0 refills | Status: DC

## 2018-01-06 MED ORDER — FLUCONAZOLE 150 MG PO TABS: 150.0000 mg | ORAL_TABLET | Freq: Every day | ORAL | 0 refills | Status: DC

## 2018-01-06 NOTE — ED Triage Notes (Signed)
PT reports drainage, facial pain and pressure, ear pain, sore throat, cough for 1 week. PT was seen here last Wednesday and has taken sudafed PE and a cough syrup at night.

## 2018-01-06 NOTE — ED Provider Notes (Signed)
[facility]    CSN: [id number] Arrival date & time: 01/06/18  1628     History   Chief Complaint Chief Complaint  Patient presents with  . URI    HPI Lydia Kennedy is a 27 y.o. female.   27 year old female comes in for 1 week history of URI symptoms.  She was seen here 5 days ago and has taken Sudafed, cough syrup at night, mucinex with some relief.  She continues to have drainage, facial pain and pressure, ear pain, sore throat, productive cough. Denies fever, chills, night sweats. Eating and drinking without problems. Never smoker.       Past Medical History:  Diagnosis Date  . Anxiety   . Chronic kidney disease    kidney reflux, problem more as child  . Infection    UTI  . Thyroid dysfunction     Patient Active Problem List   Diagnosis Date Noted  . Generalized anxiety disorder 05/04/2016  . Acute upper respiratory infection 05/04/2016  . Low TSH level 05/04/2016  . Low back pain 05/04/2016    Past Surgical History:  Procedure Laterality Date  . CESAREAN SECTION      OB History    Gravida Para Term Preterm AB Living   1 1 1     1    SAB TAB Ectopic Multiple Live Births           1       Home Medications    Prior to Admission medications   Medication Sig Start Date End Date Taking? Authorizing Provider  ALPRAZolam Prudy Feeler(XANAX) 0.5 MG tablet Take 0.5 mg by mouth 3 (three) times daily as needed for anxiety.    Yes [provider]  amphetamine-dextroamphetamine (ADDERALL) 15 MG tablet Take 15 mg by mouth daily as needed (for adhd). Only takes while in school   Yes [provider]  HYDROcodone-homatropine (HYCODAN) 5-1.5 MG/5ML syrup Take 5 mLs by mouth every 6 (six) hours as needed for cough. 01/01/18  Yes Mardella LaymanHagler, Brian, MD  OMEPRAZOLE PO Take by mouth.   Yes [provider]  doxycycline (VIBRAMYCIN) 100 MG capsule Take 1 capsule (100 mg total) by mouth 2 (two) times daily. 01/06/18   Cathie HoopsYu, Amy V, PA-C  fluconazole  (DIFLUCAN) 150 MG tablet Take 1 tablet (150 mg total) by mouth daily. Take second dose 72 hours later if symptoms still persists. 01/06/18   Cathie HoopsYu, Amy V, PA-C  fluticasone (FLONASE) 50 MCG/ACT nasal spray Place 2 sprays into both nostrils daily. 01/06/18   Cathie HoopsYu, Amy V, PA-C  montelukast (SINGULAIR) 10 MG tablet Take 1 tablet (10 mg total) by mouth at bedtime. Patient not taking: Reported on 01/01/2018 09/08/17   Elvina SidleLauenstein, Kurt, MD    Family History Family History  Problem Relation Age of Onset  . Breast cancer Mother     Social History Social History   Tobacco Use  . Smoking status: Never Smoker  . Smokeless tobacco: Never Used  Substance Use Topics  . Alcohol use: Yes    Alcohol/week: 3.0 oz    Types: 5 Standard drinks or equivalent per week    Comment: occ  . Drug use: No     Allergies   Penicillins   Review of Systems Review of Systems  Reason unable to perform ROS: See HPI as above.     Physical Exam Triage Vital Signs ED Triage Vitals [01/06/18 1708]  Enc Vitals Group     BP (!) 154/102  Pulse Rate (!) 117     Resp 16     Temp 98.2 F (36.8 C)     Temp Source Oral     SpO2 97 %     Weight 125 lb (56.7 kg)     Height      Head Circumference      Peak Flow      Pain Score 8     Pain Loc      Pain Edu?      Excl. in GC?    No data found.  Updated Vital Signs BP (!) 154/102   Pulse (!) 117   Temp 98.2 F (36.8 C) (Oral)   Resp 16   Wt 125 lb (56.7 kg)   LMP 01/03/2018   SpO2 97%   BMI 20.80 kg/m   Physical Exam  Constitutional: She is oriented to person, place, and time. She appears well-developed and well-nourished. No distress.  HENT:  Head: Normocephalic and atraumatic.  Right Ear: External ear and ear canal normal. Tympanic membrane is not erythematous and not bulging. A middle ear effusion is present.  Left Ear: External ear and ear canal normal. Tympanic membrane is not erythematous and not bulging. A middle ear effusion is present.    Nose: Right sinus exhibits maxillary sinus tenderness and frontal sinus tenderness. Left sinus exhibits maxillary sinus tenderness and frontal sinus tenderness.  Mouth/Throat: Uvula is midline, oropharynx is clear and moist and mucous membranes are normal. Tonsils are 1+ on the right. Tonsils are 1+ on the left. No tonsillar exudate.  Eyes: Conjunctivae are normal. Pupils are equal, round, and reactive to light.  Neck: Normal range of motion. Neck supple.  Cardiovascular: Normal rate, regular rhythm and normal heart sounds. Exam reveals no gallop and no friction rub.  No murmur heard. Pulmonary/Chest: Effort normal and breath sounds normal. She has no decreased breath sounds. She has no wheezes. She has no rhonchi. She has no rales.  Lymphadenopathy:    She has no cervical adenopathy.  Neurological: She is alert and oriented to person, place, and time.  Skin: Skin is warm and dry.  Psychiatric: She has a normal mood and affect. Her behavior is normal. Judgment normal.     UC Treatments / Results  Labs (all labs ordered are listed, but only abnormal results are displayed) Labs Reviewed - No data to display  EKG  EKG Interpretation None       Radiology No results found.  Procedures Procedures (including critical care time)  Medications Ordered in UC Medications - No data to display   Initial Impression / Assessment and Plan / UC Course  I have reviewed the triage vital signs and the nursing notes.  Pertinent labs & imaging results that were available during my care of the patient were reviewed by me and considered in my medical decision making (see chart for details).    Start doxycycline for sinusitis.  Other symptomatic treatment discussed.  Patient was tachycardic at triage, on exam, tachycardia has resolved with pulse rate of 100.  Patient without chest pain, shortness of breath, palpitations.  Return precautions given.  Patient expresses understanding and agrees to  plan.  Final Clinical Impressions(s) / UC Diagnoses   Final diagnoses:  Acute non-recurrent pansinusitis    ED Discharge Orders        Ordered    doxycycline (VIBRAMYCIN) 100 MG capsule  2 times daily     01/06/18 1752    fluticasone (FLONASE) 50 MCG/ACT nasal  spray  Daily     01/06/18 1752    fluconazole (DIFLUCAN) 150 MG tablet  Daily     01/06/18 1753        Belinda Fisher, PA-C 01/06/18 1758

## 2018-01-06 NOTE — Discharge Instructions (Signed)
Doxycycline as directed. I have called in diflucan in case of yeast infection. Start flonase, zyrtec for nasal congestion. You can use over the counter nasal saline rinse such as neti pot for nasal congestion. Keep hydrated, your urine should be clear to pale yellow in color. Tylenol/motrin for fever and pain. Monitor for any worsening of symptoms, chest pain, shortness of breath, wheezing, swelling of the throat, follow up for reevaluation.

## 2018-01-22 ENCOUNTER — Other Ambulatory Visit: Payer: Self-pay

## 2018-01-28 NOTE — Discharge Instructions (Signed)
T & A INSTRUCTION SHEET - [facility] °[facility] ° °CREIGHTON VAUGHT, MD °PAUL H. JUENGEL, MD  °P. [facility]ENNETT °CHAPMAN MCQUEEN, MD ° °[address] [place], [place] [address] TEL. [phone] °[address] [place] [address] [phone] ° °INFORMATION SHEET FOR A TONSILLECTOMY AND ADENDOIDECTOMY ° °About Your Tonsils and Adenoids ° The tonsils and adenoids are normal body tissues that are part of our immune system.  They normally help to protect us against diseases that may enter our mouth and nose.  However, sometimes the tonsils and/or adenoids become too large and obstruct our breathing, especially at night. °  ° If either of these things happen it helps to remove the tonsils and adenoids in order to become healthier. The operation to remove the tonsils and adenoids is called a tonsillectomy and adenoidectomy. ° °The Location of Your Tonsils and Adenoids ° The tonsils are located in the back of the throat on both side and sit in a cradle of muscles. The adenoids are located in the roof of the mouth, behind the nose, and closely associated with the opening of the Eustachian tube to the ear. ° °Surgery on Tonsils and Adenoids ° A tonsillectomy and adenoidectomy is a short operation which takes about thirty minutes.  This includes being put to sleep and being awakened.  Tonsillectomies and adenoidectomies are performed at [facility] and may require observation period in the recovery room prior to going home. ° °Following the Operation for a Tonsillectomy ° A cautery machine is used to control bleeding.  Bleeding from a tonsillectomy and adenoidectomy is minimal and postoperatively the risk of bleeding is approximately four percent, although this rarely life threatening. ° ° ° °After your tonsillectomy and adenoidectomy post-op care at home: ° °1. Our patients are able to go home the same day.  You may be given prescriptions for pain  medications and antibiotics, if indicated. °2. It is extremely important to remember that fluid intake is of utmost importance after a tonsillectomy.  The amount that you drink must be maintained in the postoperative period.  A good indication of whether a child is getting enough fluid is whether his/her urine output is constant.  As long as children are urinating or wetting their diaper every 6 - 8 hours this is usually enough fluid intake.   °3. Although rare, this is a risk of some bleeding in the first ten days after surgery.  This is usually occurs between day five and nine postoperatively.  This risk of bleeding is approximately four percent.  If you or your child should have any bleeding you should remain calm and notify our office or go directly to the Emergency Room at [facility] where they will contact us. Our doctors are available seven days a week for notification.  We recommend sitting up quietly in a chair, place an ice pack on the front of the neck and spitting out the blood gently until we are able to contact you.  Adults should gargle gently with ice water and this may help stop the bleeding.  If the bleeding does not stop after a short time, i.e. 10 to 15 minutes, or seems to be increasing again, please contact us or go to the hospital.   °4. It is common for the pain to be worse at 5 - 7 days postoperatively.  This occurs because the “scab” is peeling off and the mucous membrane (skin of   the throat) is growing back where the tonsils were.   °5. It is common for a low-grade fever, less than 102, during the first week after a tonsillectomy and adenoidectomy.  It is usually due to not drinking enough liquids, and we suggest your use liquid Tylenol or the pain medicine with Tylenol prescribed in order to keep your temperature below 102.  Please follow the directions on the back of the bottle. °6. Do not take aspirin or any products that contain aspirin such as Bufferin, Anacin,  Ecotrin, aspirin gum, Goodies, BC headache powders, etc., after a T&A because it can promote bleeding.  Please check with our office before administering any other medication that may been prescribed by other doctors during the two week post-operative period. °7. If you happen to look in the mirror or into your child’s mouth you will see white/gray patches on the back of the throat.  This is what a scab looks like in the mouth and is normal after having a T&A.  It will disappear once the tonsil area heals completely. However, it may cause a noticeable odor, and this too will disappear with time.     °8. You or your child may experience ear pain after having a T&A.  This is called referred pain and comes from the throat, but it is felt in the ears.  Ear pain is quite common and expected.  It will usually go away after ten days.  There is usually nothing wrong with the ears, and it is primarily due to the healing area stimulating the nerve to the ear that runs along the side of the throat.  Use either the prescribed pain medicine or Tylenol as needed.  °9. The throat tissues after a tonsillectomy are obviously sensitive.  Smoking around children who have had a tonsillectomy significantly increases the risk of bleeding.  DO NOT SMOKE!  ° °General Anesthesia, Adult, Care After °These instructions provide you with information about caring for yourself after your procedure. Your health care provider may also give you more specific instructions. Your treatment has been planned according to current medical practices, but problems sometimes occur. Call your health care provider if you have any problems or questions after your procedure. °What can I expect after the procedure? °After the procedure, it is common to have: °· Vomiting. °· A sore throat. °· Mental slowness. ° °It is common to feel: °· Nauseous. °· Cold or shivery. °· Sleepy. °· Tired. °· Sore or achy, even in parts of your body where you did not have  surgery. ° °Follow these instructions at home: °For at least 24 hours after the procedure: °· Do not: °? Participate in activities where you could fall or become injured. °? Drive. °? Use heavy machinery. °? Drink alcohol. °? Take sleeping pills or medicines that cause drowsiness. °? Make important decisions or sign legal documents. °? Take care of children on your own. °· Rest. °Eating and drinking °· If you vomit, drink water, juice, or soup when you can drink without vomiting. °· Drink enough fluid to keep your urine clear or pale yellow. °· Make sure you have little or no nausea before eating solid foods. °· Follow the diet recommended by your health care provider. °General instructions °· Have a responsible adult stay with you until you are awake and alert. °· Return to your normal activities as told by your health care provider. Ask your health care provider what activities are safe for you. °· Take over-the-counter and   prescription medicines only as told by your health care provider. °· If you smoke, do not smoke without supervision. °· Keep all follow-up visits as told by your health care provider. This is important. °Contact a health care provider if: °· You continue to have nausea or vomiting at home, and medicines are not helpful. °· You cannot drink fluids or start eating again. °· You cannot urinate after 8-12 hours. °· You develop a skin rash. °· You have fever. °· You have increasing redness at the site of your procedure. °Get help right away if: °· You have difficulty breathing. °· You have chest pain. °· You have unexpected bleeding. °· You feel that you are having a life-threatening or urgent problem. °This information is not intended to replace advice given to you by your health care provider. Make sure you discuss any questions you have with your health care provider. °Document Released: 03/18/2001 Document Revised: 05/14/2016 Document Reviewed: 11/24/2015 °Elsevier Interactive Patient Education  © 2018 [employer] ° °

## 2018-01-29 ENCOUNTER — Ambulatory Visit: Payer: BLUE CROSS/BLUE SHIELD | Admitting: Anesthesiology

## 2018-01-29 ENCOUNTER — Encounter
Admission: RE | Disposition: A | Discharge status: Home / Self Care | Payer: Self-pay | Source: Ambulatory Visit | Attending: Otolaryngology

## 2018-01-29 ENCOUNTER — Ambulatory Visit
Admission: RE | Admit: 2018-01-29 | Discharge: 2018-01-29 | Disposition: A | Discharge status: Home / Self Care | Payer: BLUE CROSS/BLUE SHIELD | Source: Ambulatory Visit | Attending: Otolaryngology | Admitting: Otolaryngology

## 2018-01-29 DIAGNOSIS — F17200 Nicotine dependence, unspecified, uncomplicated: Secondary | ICD-10-CM | POA: Insufficient documentation

## 2018-01-29 DIAGNOSIS — K219 Gastro-esophageal reflux disease without esophagitis: Secondary | ICD-10-CM | POA: Diagnosis not present

## 2018-01-29 DIAGNOSIS — J3501 Chronic tonsillitis: Secondary | ICD-10-CM | POA: Insufficient documentation

## 2018-01-29 HISTORY — DX: Gastro-esophageal reflux disease without esophagitis: K21.9

## 2018-01-29 HISTORY — DX: Headache: R51

## 2018-01-29 HISTORY — DX: Acute tonsillitis, unspecified: J03.90

## 2018-01-29 HISTORY — DX: Headache, unspecified: R51.9

## 2018-01-29 HISTORY — PX: TONSILLECTOMY: SHX5217

## 2018-01-29 SURGERY — TONSILLECTOMY
Anesthesia: General | Site: Throat | Wound class: Clean Contaminated

## 2018-01-29 MED ORDER — OXYMETAZOLINE HCL 0.05 % NA SOLN
NASAL | Status: DC | PRN
  Administered 2018-01-29: 1 via TOPICAL

## 2018-01-29 MED ORDER — OXYCODONE HCL 5 MG PO TABS: 5.0000 mg | ORAL_TABLET | Freq: Once | ORAL | Status: AC | PRN

## 2018-01-29 MED ORDER — SCOPOLAMINE 1 MG/3DAYS TD PT72
1.0000 | MEDICATED_PATCH | TRANSDERMAL | Status: DC
  Administered 2018-01-29: 1.5 mg via TRANSDERMAL

## 2018-01-29 MED ORDER — LACTATED RINGERS IV SOLN
INTRAVENOUS | Status: DC
  Administered 2018-01-29: 08:00:00 via INTRAVENOUS

## 2018-01-29 MED ORDER — PROPOFOL 10 MG/ML IV BOLUS
INTRAVENOUS | Status: DC | PRN
  Administered 2018-01-29: 200 mg via INTRAVENOUS

## 2018-01-29 MED ORDER — DEXAMETHASONE SODIUM PHOSPHATE 4 MG/ML IJ SOLN
INTRAMUSCULAR | Status: DC | PRN
  Administered 2018-01-29: 8 mg via INTRAVENOUS

## 2018-01-29 MED ORDER — BUPIVACAINE HCL (PF) 0.25 % IJ SOLN
INTRAMUSCULAR | Status: DC | PRN
  Administered 2018-01-29: 1 mL

## 2018-01-29 MED ORDER — FENTANYL CITRATE (PF) 100 MCG/2ML IJ SOLN
25.0000 ug | INTRAMUSCULAR | Status: DC | PRN
  Administered 2018-01-29: 25 ug via INTRAVENOUS
  Administered 2018-01-29: 50 ug via INTRAVENOUS

## 2018-01-29 MED ORDER — ONDANSETRON HCL 4 MG PO TABS: 4.0000 mg | ORAL_TABLET | Freq: Three times a day (TID) | ORAL | 0 refills | Status: DC | PRN

## 2018-01-29 MED ORDER — ONDANSETRON HCL 4 MG/2ML IJ SOLN
INTRAMUSCULAR | Status: DC | PRN
  Administered 2018-01-29: 4 mg via INTRAVENOUS

## 2018-01-29 MED ORDER — SUCCINYLCHOLINE CHLORIDE 20 MG/ML IJ SOLN
INTRAMUSCULAR | Status: DC | PRN
  Administered 2018-01-29: 100 mg via INTRAVENOUS

## 2018-01-29 MED ORDER — LIDOCAINE VISCOUS 2 % MT SOLN: 10.0000 mL | Freq: Four times a day (QID) | OROMUCOSAL | 0 refills | Status: DC | PRN

## 2018-01-29 MED ORDER — ACETAMINOPHEN 10 MG/ML IV SOLN
1000.0000 mg | Freq: Once | INTRAVENOUS | Status: AC
  Administered 2018-01-29: 1000 mg via INTRAVENOUS

## 2018-01-29 MED ORDER — MEPERIDINE HCL 25 MG/ML IJ SOLN: 6.2500 mg | INTRAMUSCULAR | Status: DC | PRN

## 2018-01-29 MED ORDER — OXYCODONE HCL 5 MG/5ML PO SOLN: 10.0000 mg | ORAL | 0 refills | Status: DC | PRN

## 2018-01-29 MED ORDER — DEXMEDETOMIDINE HCL 200 MCG/2ML IV SOLN
INTRAVENOUS | Status: DC | PRN
  Administered 2018-01-29: 8 ug via INTRAVENOUS

## 2018-01-29 MED ORDER — MIDAZOLAM HCL 5 MG/5ML IJ SOLN
INTRAMUSCULAR | Status: DC | PRN
  Administered 2018-01-29: 2 mg via INTRAVENOUS

## 2018-01-29 MED ORDER — PROMETHAZINE HCL 25 MG/ML IJ SOLN: 6.2500 mg | INTRAMUSCULAR | Status: DC | PRN

## 2018-01-29 MED ORDER — LIDOCAINE HCL (CARDIAC) 20 MG/ML IV SOLN
INTRAVENOUS | Status: DC | PRN
  Administered 2018-01-29: 40 mg via INTRAVENOUS

## 2018-01-29 MED ORDER — OXYCODONE HCL 5 MG/5ML PO SOLN
5.0000 mg | Freq: Once | ORAL | Status: AC | PRN
  Administered 2018-01-29: 5 mg via ORAL

## 2018-01-29 MED ORDER — FENTANYL CITRATE (PF) 100 MCG/2ML IJ SOLN
INTRAMUSCULAR | Status: DC | PRN
  Administered 2018-01-29: 100 ug via INTRAVENOUS

## 2018-01-29 SURGICAL SUPPLY — 18 items
BLADE BOVIE TIP EXT 4 (BLADE) ×2 IMPLANT
CANISTER SUCT 1200ML W/VALVE (MISCELLANEOUS) ×2 IMPLANT
CATH ROBINSON RED A/P 10FR (CATHETERS) ×2 IMPLANT
COAG SUCT 10F 3.5MM HAND CTRL (MISCELLANEOUS) ×2 IMPLANT
ELECT REM PT RETURN 9FT ADLT (ELECTROSURGICAL) ×2
ELECTRODE REM PT RTRN 9FT ADLT (ELECTROSURGICAL) ×1 IMPLANT
GLOVE BIO SURGEON STRL SZ7.5 (GLOVE) ×2 IMPLANT
HANDLE SUCTION POOLE (INSTRUMENTS) ×1 IMPLANT
KIT RM TURNOVER STRD PROC AR (KITS) ×2 IMPLANT
NEEDLE HYPO 25GX1X1/2 BEV (NEEDLE) ×2 IMPLANT
NS IRRIG 500ML POUR BTL (IV SOLUTION) ×2 IMPLANT
PACK TONSIL/ADENOIDS (PACKS) ×2 IMPLANT
PENCIL ELECTRO HAND CTR (MISCELLANEOUS) ×2 IMPLANT
SOL ANTI-FOG 6CC FOG-OUT (MISCELLANEOUS) ×1 IMPLANT
SOL FOG-OUT ANTI-FOG 6CC (MISCELLANEOUS) ×1
STRAP BODY AND KNEE 60X3 (MISCELLANEOUS) ×2 IMPLANT
SUCTION POOLE HANDLE (INSTRUMENTS) ×2
SYR 5ML LL (SYRINGE) ×2 IMPLANT

## 2018-01-29 NOTE — H&P (Signed)
..  History and Physical paper copy reviewed and updated date of procedure and will be scanned into system.  Patient seen and examined.  

## 2018-01-29 NOTE — Transfer of Care (Signed)
Immediate Anesthesia Transfer of Care Note  Patient: Lydia Kennedy  Procedure(s) Performed: TONSILLECTOMY (N/A Throat)  Patient Location: PACU  Anesthesia Type: General ETT  Level of Consciousness: awake, alert  and patient cooperative  Airway and Oxygen Therapy: Patient Spontanous Breathing and Patient connected to supplemental oxygen  Post-op Assessment: Post-op Vital signs reviewed, Patient's Cardiovascular Status Stable, Respiratory Function Stable, Patent Airway and No signs of Nausea or vomiting  Post-op Vital Signs: Reviewed and stable  Complications: No apparent anesthesia complications

## 2018-01-29 NOTE — Anesthesia Preprocedure Evaluation (Signed)
Anesthesia Evaluation  Patient identified by MRN, date of birth, ID band Patient awake    Reviewed: Allergy & Precautions, H&P , NPO status , Patient's Chart, lab work & pertinent test results, reviewed documented beta blocker date and time   History of Anesthesia Complications Negative for: history of anesthetic complications  Airway Mallampati: II  TM Distance: >3 FB Neck ROM: full    Dental no notable dental hx.    Pulmonary neg pulmonary ROS, Current Smoker,    Pulmonary exam normal breath sounds clear to auscultation       Cardiovascular Exercise Tolerance: Good negative cardio ROS   Rhythm:regular Rate:Normal     Neuro/Psych  Headaches, negative psych ROS   GI/Hepatic Neg liver ROS, GERD  ,  Endo/Other  negative endocrine ROS  Renal/GU negative Renal ROS  negative genitourinary   Musculoskeletal   Abdominal   Peds  Hematology negative hematology ROS (+)   Anesthesia Other Findings Recurrent tonsillitis  Reproductive/Obstetrics negative OB ROS                             Anesthesia Physical Anesthesia Plan  ASA: II  Anesthesia Plan: General ETT   Post-op Pain Management:    Induction:   PONV Risk Score and Plan:   Airway Management Planned:   Additional Equipment:   Intra-op Plan:   Post-operative Plan:   Informed Consent: I have reviewed the patients History and Physical, chart, labs and discussed the procedure including the risks, benefits and alternatives for the proposed anesthesia with the patient or authorized representative who has indicated his/her understanding and acceptance.   Dental Advisory Given  Plan Discussed with: CRNA  Anesthesia Plan Comments:         Anesthesia Quick Evaluation

## 2018-01-29 NOTE — Anesthesia Procedure Notes (Signed)
Procedure Name: Intubation Date/Time: 01/29/2018 8:48 AM Performed by: Janna Arch, CRNA Pre-anesthesia Checklist: Patient identified, Emergency Drugs available, Suction available and Patient being monitored Patient Re-evaluated:Patient Re-evaluated prior to induction Oxygen Delivery Method: Circle system utilized Preoxygenation: Pre-oxygenation with 100% oxygen Induction Type: IV induction Ventilation: Mask ventilation without difficulty Laryngoscope Size: Mac and 3 Grade View: Grade I Tube type: Oral Tube size: 7.0 mm Number of attempts: 1 Airway Equipment and Method: Stylet Placement Confirmation: ETT inserted through vocal cords under direct vision,  CO2 detector and positive ETCO2 Tube secured with: Tape Dental Injury: Teeth and Oropharynx as per pre-operative assessment

## 2018-01-29 NOTE — Anesthesia Postprocedure Evaluation (Signed)
Anesthesia Post Note  Patient: Lydia Kennedy  Procedure(s) Performed: TONSILLECTOMY (N/A Throat)  Patient location during evaluation: PACU Anesthesia Type: General Level of consciousness: awake and alert Pain management: pain level controlled Vital Signs Assessment: post-procedure vital signs reviewed and stable Respiratory status: spontaneous breathing, nonlabored ventilation, respiratory function stable and patient connected to nasal cannula oxygen Cardiovascular status: blood pressure returned to baseline and stable Postop Assessment: no apparent nausea or vomiting Anesthetic complications: no    SCOURAS, Razan ELAINE

## 2018-01-29 NOTE — Op Note (Signed)
..  01/29/2018  9:04 AM    Krauter, Joni ReiningNicole  [id number]   Pre-Op Dx:  CHRONIC TONSILLITIS  Post-op Dx: CHRONIC TONSILLITIS  Proc:Tonsillectomy > age 27  Surg: Kathleen Tamm  Anes:  General Endotracheal  EBL:  <4910ml  Comp:  None  Findings:  3+ cryptic tonsils bilaterally right > left.  No adenoid tissue visualized.  Procedure: After the patient was identified in holding and the history and physical and consent was reviewed, the patient was taken to the operating room and placed in a supine position.  General endotracheal anesthesia was induced in the normal fashion.  At this time, the patient was rotated 45 degrees and a shoulder roll was placed.  At this time, a McIvor mouthgag was inserted into the patient's oral cavity and suspended from the Mayo stand without injury to teeth, lips, or gums.  Next a red rubber catheter was inserted into the patient left nostril for retraction of the uvula and soft palate superiorly.  Next a curved Alice clamp was attached to the patient's right superior tonsillar pole and retracted medially and inferiorly.  A Bovie electrocautery was used to dissect the patient's right tonsil in a subcapsular plane.  Meticulous hemostasis was achieved with Bovie suction cautery.  At this time, the mouth gag was released from suspension for 1 minute.  Attention now was directed to the patient's left side.  In a similar fashion the curved Alice clamp was attached to the superior pole and this was retracted medially and inferiorly and the tonsil was excised in a subcapsular plane with Bovie electrocautery.  After completion of the second tonsil, meticulous hemostasis was continued.  At this time, the patient's nasal cavity and oral cavity was irrigated with sterile saline.  One ml of 0.25% Marcaine was injected into the anterior and posterior tonsillar fossa bilaterally.  Following this  The care of patient was returned to anesthesia, awakened, and transferred to  recovery in stable condition.  Dispo:  PACU to home  Plan: Soft diet.  Limit exercise and strenuous activity for 2 weeks.  Fluid hydration  Recheck my office three weeks.   Teja Judice 9:04 AM 01/29/2018

## 2018-01-31 LAB — SURGICAL PATHOLOGY

## 2018-06-16 ENCOUNTER — Telehealth: Payer: Self-pay | Admitting: Family Medicine

## 2018-06-16 NOTE — Telephone Encounter (Unsigned)
Copied from CRM [id number]. Topic: Quick Communication - See Telephone Encounter >> Jun 16, 2018  4:36 PM Raquel SarnaHayes, Teresa G wrote: Pt was supposed to be a pt Dr. Birdie SonsSonnenberg - last seen on 05-04-16. Pt needing to be seen for: Sinus infection - yellow mucus Blood pressure - 140/103

## 2018-06-16 NOTE — Telephone Encounter (Signed)
Tried to reach patient by phone no answer, no voicemail. 

## 2018-06-24 ENCOUNTER — Other Ambulatory Visit: Payer: Self-pay

## 2018-06-24 ENCOUNTER — Encounter: Payer: Self-pay | Admitting: Gastroenterology

## 2018-06-24 ENCOUNTER — Encounter

## 2018-06-24 ENCOUNTER — Ambulatory Visit (INDEPENDENT_AMBULATORY_CARE_PROVIDER_SITE_OTHER): Payer: BLUE CROSS/BLUE SHIELD | Admitting: Gastroenterology

## 2018-06-24 VITALS — BP 122/78 | HR 94 | Ht 65.0 in | Wt 122.0 lb

## 2018-06-24 DIAGNOSIS — F458 Other somatoform disorders: Secondary | ICD-10-CM | POA: Diagnosis not present

## 2018-06-24 DIAGNOSIS — K219 Gastro-esophageal reflux disease without esophagitis: Secondary | ICD-10-CM

## 2018-06-24 NOTE — Progress Notes (Signed)
Gastroenterology Consultation  Referring Provider:     Bud FaceVaught, Creighton, MD Primary Care Physician:  No primary care provider on file. Primary Gastroenterologist:  Dr. Servando SnareWohl     Reason for Consultation:     Globus        HPI:   Lydia Glaziericole F Cahn is a 27 y.o. y/o female referred for consultation & management of globus by Dr. No primary care provider on file..  This patient comes in today with a history of a globus sensation in her throat for at least the last 3 years.  The patient was seen by Dr. Charlane FerrettiMacqueen who did not feel that her tonsils should be removed and that it may not solve her problems.  The patient then was seen by Dr.Vaught who after reevaluating the patient decided to remove the patient's tonsils.  The patient states that some of her sensation has decreased but she continues to have some residual fullness in the throat.  The patient has been tried on multiple PPIs and is presently on omeprazole 40 mg in the morning and 20 mg at night.  She reports that she wakes up in the morning approximately 4 times a week with vomiting of acid and bile.  She also reports that she has a burning sensation at the back of her tongue.  The patient states she tried Dexilant once a day in the morning but it was only given for 5 days of samples and was too expensive to continue on it.  There is no report of any dysphagia or unexplained weight loss fevers chills vomiting black stools or bloody stools.  Past Medical History:  Diagnosis Date  . Anxiety   . Chronic kidney disease    kidney reflux, problem more as child/ recuurent interstitial cystitis  . GERD (gastroesophageal reflux disease)   . Headache    daily  . Infection    UTI  . Thyroid dysfunction   . Tonsillitis     Past Surgical History:  Procedure Laterality Date  . BREAST SURGERY     augmentation  . CESAREAN SECTION    . TONSILLECTOMY N/A 01/29/2018   Procedure: TONSILLECTOMY;  Surgeon: Bud FaceVaught, Creighton, MD;  [facility];  Service: ENT;  Laterality: N/A;  UPREG    Prior to Admission medications   Medication Sig Start Date End Date Taking? Authorizing Provider  ALPRAZolam Prudy Feeler(XANAX) 0.5 MG tablet Take 2 mg by mouth 3 (three) times daily as needed for anxiety.    Yes [provider]  amphetamine-dextroamphetamine (ADDERALL) 15 MG tablet Take 30 mg by mouth 3 (three) times daily. Only takes while in school   Yes [provider]  brompheniramine-pseudoephedrine-DM 30-2-10 MG/5ML syrup Take by mouth. 06/17/18  Yes [provider]  doxycycline (VIBRA-TABS) 100 MG tablet  06/17/18  Yes [provider]  OMEPRAZOLE PO Take 40 mg by mouth daily. am   Yes [provider]  lidocaine (XYLOCAINE) 2 % solution Use as directed 10 mLs in the mouth or throat every 6 (six) hours as needed for mouth pain (Swish and spit). Patient not taking: Reported on 06/24/2018 01/29/18   Bud FaceVaught, Creighton, MD  ondansetron (ZOFRAN) 4 MG tablet Take 1 tablet (4 mg total) by mouth every 8 (eight) hours as needed for up to 10 doses for nausea or vomiting. Patient not taking: Reported on 06/24/2018 01/29/18   Bud FaceVaught, Creighton, MD  oxyCODONE (ROXICODONE) 5 MG/5ML solution Take 10 mLs (10 mg total) by mouth every 4 (four) hours  as needed for severe pain. Patient not taking: Reported on 06/24/2018 01/29/18   Bud Face, MD    Family History  Problem Relation Age of Onset  . Breast cancer Mother      Social History   Tobacco Use  . Smoking status: Current Every Day Smoker    Packs/day: 0.25    Types: Cigarettes  . Smokeless tobacco: Never Used  Substance Use Topics  . Alcohol use: Yes    Alcohol/week: 1.8 oz    Types: 3 Standard drinks or equivalent per week    Comment: occ  . Drug use: No    Allergies as of 06/24/2018 - Review Complete 06/24/2018  Allergen Reaction Noted  . Penicillins Anaphylaxis and Shortness Of Breath 01/01/2018    Review of Systems:    All systems reviewed and  negative except where noted in HPI.   Physical Exam:  BP 122/78   Pulse 94   Ht 5\' 5"  (1.651 m)   Wt 122 lb (55.3 kg)   BMI 20.30 kg/m  No LMP recorded. General:   Alert,  Well-developed, well-nourished, pleasant and cooperative in NAD Head:  Normocephalic and atraumatic. Eyes:  Sclera clear, no icterus.   Conjunctiva pink. Ears:  Normal auditory acuity. Nose:  No deformity, discharge, or lesions. Mouth:  No deformity or lesions,oropharynx pink & moist. Neck:  Supple; no masses or thyromegaly. Lungs:  Respirations even and unlabored.  Clear throughout to auscultation.   No wheezes, crackles, or rhonchi. No acute distress. Heart:  Regular rate and rhythm; no murmurs, clicks, rubs, or gallops. Abdomen:  Normal bowel sounds.  No bruits.  Soft, mild epigastric tendernessand non-distended without masses, hepatosplenomegaly or hernias noted.  No guarding or rebound tenderness.  Negative Carnett sign.   Rectal:  Deferred.  Msk:  Symmetrical without gross deformities.  Good, equal movement & strength bilaterally. Pulses:  Normal pulses noted. Extremities:  No clubbing or edema.  No cyanosis. Neurologic:  Alert and oriented x3;  grossly normal neurologically. Skin:  Intact without significant lesions or rashes.  No jaundice. Lymph Nodes:  No significant cervical adenopathy. Psych:  Alert and cooperative. Normal mood and affect.  Imaging Studies: No results found.  Assessment and Plan:   Lydia Kennedy is a 27 y.o. y/o female who comes in with a history of globus with morning vomiting.  The patient will be switched back to Dexilant that has been given 3 weeks of samples.  She will take the Dexilant in the evening to have the maximum amount of acid suppression while she is lying down at night.  The patient has also been told not to eat or drink for 4 hours before she goes to sleep.  The patient will stop the omeprazole and she will also be set up for an EGD to look for any sign of  chronic reflux disease.  The patient may need a 24-hour pH probe if her symptoms do not improve with the present plan.  The patient has been explained the plan and agrees with it.  Midge Minium, MD. Clementeen Graham    Note: This dictation was prepared with Dragon dictation along with smaller phrase technology. Any transcriptional errors that result from this process are unintentional.

## 2018-06-25 ENCOUNTER — Other Ambulatory Visit: Payer: Self-pay

## 2018-06-25 DIAGNOSIS — K219 Gastro-esophageal reflux disease without esophagitis: Secondary | ICD-10-CM

## 2018-06-25 DIAGNOSIS — F458 Other somatoform disorders: Secondary | ICD-10-CM

## 2018-07-02 ENCOUNTER — Other Ambulatory Visit: Payer: Self-pay

## 2018-07-02 ENCOUNTER — Encounter: Payer: Self-pay | Admitting: *Deleted

## 2018-07-03 NOTE — Discharge Instructions (Signed)
General Anesthesia, Adult, Care After °These instructions provide you with information about caring for yourself after your procedure. Your health care provider may also give you more specific instructions. Your treatment has been planned according to current medical practices, but problems sometimes occur. Call your health care provider if you have any problems or questions after your procedure. °What can I expect after the procedure? °After the procedure, it is common to have: °· Vomiting. °· A sore throat. °· Mental slowness. ° °It is common to feel: °· Nauseous. °· Cold or shivery. °· Sleepy. °· Tired. °· Sore or achy, even in parts of your body where you did not have surgery. ° °Follow these instructions at home: °For at least 24 hours after the procedure: °· Do not: °? Participate in activities where you could fall or become injured. °? Drive. °? Use heavy machinery. °? Drink alcohol. °? Take sleeping pills or medicines that cause drowsiness. °? Make important decisions or sign legal documents. °? Take care of children on your own. °· Rest. °Eating and drinking °· If you vomit, drink water, juice, or soup when you can drink without vomiting. °· Drink enough fluid to keep your urine clear or pale yellow. °· Make sure you have little or no nausea before eating solid foods. °· Follow the diet recommended by your health care provider. °General instructions °· Have a responsible adult stay with you until you are awake and alert. °· Return to your normal activities as told by your health care provider. Ask your health care provider what activities are safe for you. °· Take over-the-counter and prescription medicines only as told by your health care provider. °· If you smoke, do not smoke without supervision. °· Keep all follow-up visits as told by your health care provider. This is important. °Contact a health care provider if: °· You continue to have nausea or vomiting at home, and medicines are not helpful. °· You  cannot drink fluids or start eating again. °· You cannot urinate after 8-12 hours. °· You develop a skin rash. °· You have fever. °· You have increasing redness at the site of your procedure. °Get help right away if: °· You have difficulty breathing. °· You have chest pain. °· You have unexpected bleeding. °· You feel that you are having a life-threatening or urgent problem. °This information is not intended to replace advice given to you by your health care provider. Make sure you discuss any questions you have with your health care provider. °Document Released: 03/18/2001 Document Revised: 05/14/2016 Document Reviewed: 11/24/2015 °Elsevier Interactive Patient Education © 2018 [employer]vier Inc. ° °

## 2018-07-07 ENCOUNTER — Encounter
Admission: RE | Disposition: A | Discharge status: Home / Self Care | Payer: Self-pay | Source: Ambulatory Visit | Attending: Gastroenterology

## 2018-07-07 ENCOUNTER — Ambulatory Visit: Payer: BLUE CROSS/BLUE SHIELD | Admitting: Anesthesiology

## 2018-07-07 ENCOUNTER — Ambulatory Visit
Admission: RE | Admit: 2018-07-07 | Discharge: 2018-07-07 | Disposition: A | Discharge status: Home / Self Care | Payer: BLUE CROSS/BLUE SHIELD | Source: Ambulatory Visit | Attending: Gastroenterology | Admitting: Gastroenterology

## 2018-07-07 DIAGNOSIS — F458 Other somatoform disorders: Secondary | ICD-10-CM | POA: Diagnosis not present

## 2018-07-07 DIAGNOSIS — Z880 Allergy status to penicillin: Secondary | ICD-10-CM | POA: Insufficient documentation

## 2018-07-07 DIAGNOSIS — Z87891 Personal history of nicotine dependence: Secondary | ICD-10-CM | POA: Diagnosis not present

## 2018-07-07 DIAGNOSIS — F909 Attention-deficit hyperactivity disorder, unspecified type: Secondary | ICD-10-CM | POA: Insufficient documentation

## 2018-07-07 DIAGNOSIS — K219 Gastro-esophageal reflux disease without esophagitis: Secondary | ICD-10-CM | POA: Diagnosis not present

## 2018-07-07 DIAGNOSIS — F419 Anxiety disorder, unspecified: Secondary | ICD-10-CM | POA: Insufficient documentation

## 2018-07-07 DIAGNOSIS — Z79899 Other long term (current) drug therapy: Secondary | ICD-10-CM | POA: Insufficient documentation

## 2018-07-07 DIAGNOSIS — K2900 Acute gastritis without bleeding: Secondary | ICD-10-CM | POA: Diagnosis not present

## 2018-07-07 DIAGNOSIS — K2950 Unspecified chronic gastritis without bleeding: Secondary | ICD-10-CM | POA: Insufficient documentation

## 2018-07-07 HISTORY — DX: Attention-deficit hyperactivity disorder, unspecified type: F90.9

## 2018-07-07 HISTORY — PX: ESOPHAGOGASTRODUODENOSCOPY (EGD) WITH PROPOFOL: SHX5813

## 2018-07-07 SURGERY — ESOPHAGOGASTRODUODENOSCOPY (EGD) WITH PROPOFOL
Anesthesia: General | Site: Mouth | Wound class: Clean Contaminated

## 2018-07-07 MED ORDER — GLYCOPYRROLATE 0.2 MG/ML IJ SOLN
INTRAMUSCULAR | Status: DC | PRN
  Administered 2018-07-07: .2 mg via INTRAVENOUS

## 2018-07-07 MED ORDER — PROPOFOL 10 MG/ML IV BOLUS
INTRAVENOUS | Status: DC | PRN
  Administered 2018-07-07: 30 mg via INTRAVENOUS
  Administered 2018-07-07: 20 mg via INTRAVENOUS
  Administered 2018-07-07: 80 mg via INTRAVENOUS

## 2018-07-07 MED ORDER — LACTATED RINGERS IV SOLN
INTRAVENOUS | Status: DC
  Administered 2018-07-07: 09:00:00 via INTRAVENOUS

## 2018-07-07 MED ORDER — ACETAMINOPHEN 160 MG/5ML PO SOLN: 325.0000 mg | Freq: Once | ORAL | Status: DC

## 2018-07-07 MED ORDER — SIMETHICONE 40 MG/0.6ML PO SUSP
ORAL | Status: DC | PRN
  Administered 2018-07-07: 3 mL

## 2018-07-07 MED ORDER — ACETAMINOPHEN 325 MG PO TABS: 325.0000 mg | ORAL_TABLET | Freq: Once | ORAL | Status: DC

## 2018-07-07 MED ORDER — LIDOCAINE HCL (CARDIAC) PF 100 MG/5ML IV SOSY
PREFILLED_SYRINGE | INTRAVENOUS | Status: DC | PRN
  Administered 2018-07-07 (×2): 40 mg via INTRAVENOUS

## 2018-07-07 SURGICAL SUPPLY — 33 items
BALLN DILATOR 10-12 8 (BALLOONS)
BALLN DILATOR 12-15 8 (BALLOONS)
BALLN DILATOR 15-18 8 (BALLOONS)
BALLN DILATOR CRE 0-12 8 (BALLOONS)
BALLN DILATOR ESOPH 8 10 CRE (MISCELLANEOUS) IMPLANT
BALLOON DILATOR 12-15 8 (BALLOONS) IMPLANT
BALLOON DILATOR 15-18 8 (BALLOONS) IMPLANT
BALLOON DILATOR CRE 0-12 8 (BALLOONS) IMPLANT
BLOCK BITE 60FR ADLT L/F GRN (MISCELLANEOUS) ×2 IMPLANT
CANISTER SUCT 1200ML W/VALVE (MISCELLANEOUS) ×2 IMPLANT
CLIP HMST 235XBRD CATH ROT (MISCELLANEOUS) IMPLANT
CLIP RESOLUTION 360 11X235 (MISCELLANEOUS)
ELECT REM PT RETURN 9FT ADLT (ELECTROSURGICAL)
ELECTRODE REM PT RTRN 9FT ADLT (ELECTROSURGICAL) IMPLANT
FCP ESCP3.2XJMB 240X2.8X (MISCELLANEOUS)
FORCEPS BIOP RAD 4 LRG CAP 4 (CUTTING FORCEPS) ×2 IMPLANT
FORCEPS BIOP RJ4 240 W/NDL (MISCELLANEOUS)
FORCEPS ESCP3.2XJMB 240X2.8X (MISCELLANEOUS) IMPLANT
GOWN CVR UNV OPN BCK APRN NK (MISCELLANEOUS) ×2 IMPLANT
GOWN ISOL THUMB LOOP REG UNIV (MISCELLANEOUS) ×2
INJECTOR VARIJECT VIN23 (MISCELLANEOUS) IMPLANT
KIT DEFENDO VALVE AND CONN (KITS) IMPLANT
KIT ENDO PROCEDURE OLY (KITS) ×2 IMPLANT
MARKER SPOT ENDO TATTOO 5ML (MISCELLANEOUS) IMPLANT
RETRIEVER NET PLAT FOOD (MISCELLANEOUS) IMPLANT
SNARE SHORT THROW 13M SML OVAL (MISCELLANEOUS) IMPLANT
SNARE SHORT THROW 30M LRG OVAL (MISCELLANEOUS) IMPLANT
SPOT EX ENDOSCOPIC TATTOO (MISCELLANEOUS)
SYR INFLATION 60ML (SYRINGE) IMPLANT
TRAP ETRAP POLY (MISCELLANEOUS) IMPLANT
VARIJECT INJECTOR VIN23 (MISCELLANEOUS)
WATER STERILE IRR 250ML POUR (IV SOLUTION) ×2 IMPLANT
WIRE CRE 18-20MM 8CM F G (MISCELLANEOUS) IMPLANT

## 2018-07-07 NOTE — Anesthesia Procedure Notes (Signed)
Performed by: Aaliyha Mumford, CRNA Pre-anesthesia Checklist: Patient identified, Emergency Drugs available, Suction available, Timeout performed and Patient being monitored Patient Re-evaluated:Patient Re-evaluated prior to induction Oxygen Delivery Method: Nasal cannula Placement Confirmation: positive ETCO2       

## 2018-07-07 NOTE — Anesthesia Preprocedure Evaluation (Signed)
Anesthesia Evaluation  Patient identified by MRN, date of birth, ID band Patient awake    Reviewed: Allergy & Precautions, H&P , NPO status , Patient's Chart, lab work & pertinent test results  Airway Mallampati: I  TM Distance: >3 FB Neck ROM: full    Dental no notable dental hx.    Pulmonary former smoker,    Pulmonary exam normal breath sounds clear to auscultation       Cardiovascular Normal cardiovascular exam Rhythm:regular Rate:Normal     Neuro/Psych    GI/Hepatic GERD  ,  Endo/Other    Renal/GU      Musculoskeletal   Abdominal   Peds  Hematology   Anesthesia Other Findings   Reproductive/Obstetrics                             Anesthesia Physical Anesthesia Plan  ASA: II  Anesthesia Plan: General   Post-op Pain Management:    Induction: Intravenous  PONV Risk Score and Plan: 3 and Propofol infusion and Treatment may vary due to age or medical condition  Airway Management Planned: Natural Airway  Additional Equipment:   Intra-op Plan:   Post-operative Plan:   Informed Consent: I have reviewed the patients History and Physical, chart, labs and discussed the procedure including the risks, benefits and alternatives for the proposed anesthesia with the patient or authorized representative who has indicated his/her understanding and acceptance.     Plan Discussed with: CRNA  Anesthesia Plan Comments:         Anesthesia Quick Evaluation

## 2018-07-07 NOTE — H&P (Signed)
Midge Miniumarren Tensley Wery, MD [facility] [address] [place], [place] [address] Phone:[phone] Fax : [phone]  Primary Care Physician:  Danella PentonMiller, Mark F, MD Primary Gastroenterologist:  Dr. Servando SnareWohl  Pre-Procedure History & Physical: HPI:  Lydia Kennedy is a 27 y.o. female is here for an endoscopy.   Past Medical History:  Diagnosis Date  . ADHD   . Anxiety   . Chronic kidney disease    kidney reflux, problem more as child/ recuurent interstitial cystitis  . GERD (gastroesophageal reflux disease)   . Headache    daily  . Infection    UTI  . Thyroid dysfunction   . Tonsillitis     Past Surgical History:  Procedure Laterality Date  . BREAST SURGERY     augmentation  . CESAREAN SECTION    . TONSILLECTOMY N/A 01/29/2018   Procedure: TONSILLECTOMY;  Surgeon: Bud FaceVaught, Creighton, MD;  [facility];  Service: ENT;  Laterality: N/A;  UPREG    Prior to Admission medications   Medication Sig Start Date End Date Taking? Authorizing Provider  ALPRAZolam Prudy Feeler(XANAX) 0.5 MG tablet Take 2 mg by mouth 3 (three) times daily as needed for anxiety.    Yes [provider]  amphetamine-dextroamphetamine (ADDERALL) 15 MG tablet Take 30 mg by mouth 3 (three) times daily. Only takes while in school   Yes [provider]  Ascorbic Acid (VITAMIN C PO) Take by mouth daily.   Yes [provider]  cetirizine (ZYRTEC) 10 MG tablet Take 10 mg by mouth daily.   Yes [provider]  Cholecalciferol (VITAMIN D3 PO) Take by mouth daily.   Yes [provider]  Dexlansoprazole (DEXILANT PO) Take by mouth daily.   Yes [provider]  FLUCONAZOLE PO Take by mouth.   Yes [provider]  LYSINE PO Take by mouth daily.   Yes [provider]  OMEPRAZOLE PO Take 40 mg by mouth daily. am    [provider]    Allergies as of 06/25/2018 - Review Complete 06/24/2018  Allergen Reaction Noted  . Penicillins Anaphylaxis  and Shortness Of Breath 01/01/2018    Family History  Problem Relation Age of Onset  . Breast cancer Mother     Social History   Socioeconomic History  . Marital status: Single    Spouse name: Not on file  . Number of children: Not on file  . Years of education: Not on file  . Highest education level: Not on file  Occupational History  . Not on file  Social Needs  . Financial resource strain: Not on file  . Food insecurity:    Worry: Not on file    Inability: Not on file  . Transportation needs:    Medical: Not on file    Non-medical: Not on file  Tobacco Use  . Smoking status: Former Smoker    Packs/day: 0.25    Types: Cigarettes    Last attempt to quit: 01/2018    Years since quitting: 0.4  . Smokeless tobacco: Never Used  Substance and Sexual Activity  . Alcohol use: Yes    Alcohol/week: 1.8 oz    Types: 3 Standard drinks or equivalent per week    Comment: occ  . Drug use: No  . Sexual activity: Yes    Birth control/protection: None  Lifestyle  . Physical activity:    Days per week: Not on file    Minutes per session: Not on file  . Stress: Not on  file  Relationships  . Social connections:    Talks on phone: Not on file    Gets together: Not on file    Attends religious service: Not on file    Active member of club or organization: Not on file    Attends meetings of clubs or organizations: Not on file    Relationship status: Not on file  . Intimate partner violence:    Fear of current or ex partner: Not on file    Emotionally abused: Not on file    Physically abused: Not on file    Forced sexual activity: Not on file  Other Topics Concern  . Not on file  Social History Narrative  . Not on file    Review of Systems: See HPI, otherwise negative ROS  Physical Exam: BP 118/76   Pulse 77   Temp 98.1 F (36.7 C) (Temporal)   Resp 16   Ht 5\' 5"  (1.651 m)   Wt 124 lb (56.2 kg)   LMP 06/18/2018 (Approximate) Comment: Negative Preg Test  SpO2 100%    BMI 20.63 kg/m  General:   Alert,  pleasant and cooperative in NAD Head:  Normocephalic and atraumatic. Neck:  Supple; no masses or thyromegaly. Lungs:  Clear throughout to auscultation.    Heart:  Regular rate and rhythm. Abdomen:  Soft, nontender and nondistended. Normal bowel sounds, without guarding, and without rebound.   Neurologic:  Alert and  oriented x4;  grossly normal neurologically.  Impression/Plan: Lydia Kennedy is here for an endoscopy to be performed for globus  Risks, benefits, limitations, and alternatives regarding  endoscopy have been reviewed with the patient.  Questions have been answered.  All parties agreeable.   Midge Minium, MD  07/07/2018, 9:33 AM

## 2018-07-07 NOTE — Transfer of Care (Signed)
Immediate Anesthesia Transfer of Care Note  Patient: Lydia Kennedy  Procedure(s) Performed: ESOPHAGOGASTRODUODENOSCOPY (EGD) WITH PROPOFOL (N/A )  Patient Location: PACU  Anesthesia Type: General  Level of Consciousness: awake, alert  and patient cooperative  Airway and Oxygen Therapy: Patient Spontanous Breathing and Patient connected to supplemental oxygen  Post-op Assessment: Post-op Vital signs reviewed, Patient's Cardiovascular Status Stable, Respiratory Function Stable, Patent Airway and No signs of Nausea or vomiting  Post-op Vital Signs: Reviewed and stable  Complications: No apparent anesthesia complications

## 2018-07-07 NOTE — Anesthesia Postprocedure Evaluation (Signed)
Anesthesia Post Note  Patient: Lorenza Cambridgeicole F Schnick  Procedure(s) Performed: ESOPHAGOGASTRODUODENOSCOPY (EGD) WITH PROPOFOL (N/A Mouth)  Patient location during evaluation: PACU Anesthesia Type: General Level of consciousness: awake and alert and oriented Pain management: satisfactory to patient Vital Signs Assessment: post-procedure vital signs reviewed and stable Respiratory status: spontaneous breathing, nonlabored ventilation and respiratory function stable Cardiovascular status: blood pressure returned to baseline and stable Postop Assessment: Adequate PO intake and No signs of nausea or vomiting Anesthetic complications: no    Cherly BeachStella, Briante Loveall J

## 2018-07-07 NOTE — Op Note (Signed)
[facility] Gastroenterology Patient Name: Lydia Kennedy Procedure Date: 07/07/2018 9:41 AM MRN: [id number] Account #: [id number] Date of Birth: [birth year] Admit Type: Outpatient Age: 27 Room: [facility] OR ROOM 01 Gender: Female Note Status: Finalized Procedure:            Upper GI endoscopy Indications:          Globus sensation Providers:            Midge Minium MD, MD Referring MD:         Danella Penton, MD (Referring MD) Medicines:            Propofol per Anesthesia Complications:        No immediate complications. Procedure:            Pre-Anesthesia Assessment:                       - Prior to the procedure, a History and Physical was                        performed, and patient medications and allergies were                        reviewed. The patient's tolerance of previous                        anesthesia was also reviewed. The risks and benefits of                        the procedure and the sedation options and risks were                        discussed with the patient. All questions were                        answered, and informed consent was obtained. Prior                        Anticoagulants: The patient has taken no previous                        anticoagulant or antiplatelet agents. ASA Grade                        Assessment: II - A patient with mild systemic disease.                        After reviewing the risks and benefits, the patient was                        deemed in satisfactory condition to undergo the                        procedure.                       After obtaining informed consent, the endoscope was                        passed under direct vision. Throughout the procedure,  the patient's blood pressure, pulse, and oxygen                        saturations were monitored continuously. The was                        introduced through the mouth, and advanced to the   second part of duodenum. The upper GI endoscopy was                        accomplished without difficulty. The patient tolerated                        the procedure well. Findings:      The examined esophagus was normal.      Localized mild inflammation characterized by erythema was found in the       gastric antrum. Biopsies were taken with a cold forceps for histology.      The examined duodenum was normal. Impression:           - Normal esophagus.                       - Gastritis. Biopsied.                       - Normal examined duodenum. Recommendation:       - Discharge patient to home.                       - Resume previous diet.                       - Continue present medications.                       - Await pathology results. 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 All rights reserved. The codes documented in this report are preliminary and upon coder review may  be revised to meet current compliance requirements. Midge Miniumarren Savas Elvin MD, MD 07/07/2018 9:51:55 AM This report has been signed electronically. Number of Addenda: 0 Note Initiated On: 07/07/2018 9:41 AM      [facility]

## 2018-07-08 ENCOUNTER — Encounter: Payer: Self-pay | Admitting: Gastroenterology

## 2018-07-09 ENCOUNTER — Encounter: Payer: Self-pay | Admitting: Gastroenterology

## 2018-07-15 ENCOUNTER — Other Ambulatory Visit: Payer: Self-pay

## 2018-07-15 MED ORDER — DEXLANSOPRAZOLE 60 MG PO CPDR: 60.0000 mg | DELAYED_RELEASE_CAPSULE | Freq: Every day | ORAL | 11 refills | Status: DC

## 2018-08-06 ENCOUNTER — Other Ambulatory Visit: Payer: Self-pay

## 2018-08-06 ENCOUNTER — Emergency Department
Admission: EM | Admit: 2018-08-06 | Discharge: 2018-08-06 | Disposition: A | Discharge status: Home / Self Care | Payer: BLUE CROSS/BLUE SHIELD | Attending: Emergency Medicine | Admitting: Emergency Medicine

## 2018-08-06 DIAGNOSIS — N189 Chronic kidney disease, unspecified: Secondary | ICD-10-CM | POA: Diagnosis not present

## 2018-08-06 DIAGNOSIS — Z87891 Personal history of nicotine dependence: Secondary | ICD-10-CM | POA: Diagnosis not present

## 2018-08-06 DIAGNOSIS — L03031 Cellulitis of right toe: Secondary | ICD-10-CM | POA: Diagnosis not present

## 2018-08-06 DIAGNOSIS — Z79899 Other long term (current) drug therapy: Secondary | ICD-10-CM | POA: Insufficient documentation

## 2018-08-06 DIAGNOSIS — R21 Rash and other nonspecific skin eruption: Secondary | ICD-10-CM | POA: Diagnosis present

## 2018-08-06 MED ORDER — FLUCONAZOLE 150 MG PO TABS: 150.0000 mg | ORAL_TABLET | Freq: Every day | ORAL | 0 refills | Status: AC

## 2018-08-06 MED ORDER — CEPHALEXIN 500 MG PO CAPS
500.0000 mg | ORAL_CAPSULE | Freq: Once | ORAL | Status: AC
  Administered 2018-08-06: 500 mg via ORAL
  Filled 2018-08-06: qty 1

## 2018-08-06 MED ORDER — CEPHALEXIN 500 MG PO CAPS: 500.0000 mg | ORAL_CAPSULE | Freq: Four times a day (QID) | ORAL | 0 refills | Status: AC

## 2018-08-06 NOTE — ED Triage Notes (Signed)
Pt with red, swollen, area to right 3rd toe. Pt unsure of insect bite or injury.

## 2018-08-06 NOTE — ED Provider Notes (Signed)
[facility] Emergency Department Provider Note ____________________________________________   First MD Initiated Contact with Patient 08/06/18 0209     (approximate)  I have reviewed the triage vital signs and the nursing notes.   HISTORY  Chief Complaint Wound Check    HPI Lydia Kennedy is a 27 y.o. female with PMH as noted below who presents with localized rash and wound to right third toe over the last week, gradual onset, persistent course, and not healed despite applying gentamicin ointment.  The patient is unsure if she possibly got bitten by spider or another insect.  She denies any pain spreading up the leg.  She has no fever or chills.  Past Medical History:  Diagnosis Date  . ADHD   . Anxiety   . Chronic kidney disease    kidney reflux, problem more as child/ recuurent interstitial cystitis  . GERD (gastroesophageal reflux disease)   . Headache    daily  . Infection    UTI  . Thyroid dysfunction   . Tonsillitis     Patient Active Problem List   Diagnosis Date Noted  . Other somatoform disorders   . Gastroesophageal reflux disease without esophagitis   . Acute gastritis without hemorrhage   . Generalized anxiety disorder 05/04/2016  . Acute upper respiratory infection 05/04/2016  . Low TSH level 05/04/2016  . Low back pain 05/04/2016    Past Surgical History:  Procedure Laterality Date  . BREAST SURGERY     augmentation  . CESAREAN SECTION    . ESOPHAGOGASTRODUODENOSCOPY (EGD) WITH PROPOFOL N/A 07/07/2018   Procedure: ESOPHAGOGASTRODUODENOSCOPY (EGD) WITH PROPOFOL;  Surgeon: Midge MiniumWohl, Darren, MD;  [facility];  Service: Endoscopy;  Laterality: N/A;  . TONSILLECTOMY N/A 01/29/2018   Procedure: TONSILLECTOMY;  Surgeon: Bud FaceVaught, Creighton, MD;  [facility];  Service: ENT;  Laterality: N/A;  UPREG    Prior to Admission medications   Medication Sig Start Date End Date Taking? Authorizing Provider   ALPRAZolam Prudy Feeler(XANAX) 0.5 MG tablet Take 2 mg by mouth 3 (three) times daily as needed for anxiety.     [provider]  amphetamine-dextroamphetamine (ADDERALL) 15 MG tablet Take 30 mg by mouth 3 (three) times daily. Only takes while in school    [provider]  Ascorbic Acid (VITAMIN C PO) Take by mouth daily.    [provider]  cetirizine (ZYRTEC) 10 MG tablet Take 10 mg by mouth daily.    [provider]  Cholecalciferol (VITAMIN D3 PO) Take by mouth daily.    [provider]  dexlansoprazole (DEXILANT) 60 MG capsule Take 1 capsule (60 mg total) by mouth daily. 07/15/18   Midge MiniumWohl, Darren, MD  FLUCONAZOLE PO Take by mouth.    [provider]  ibuprofen (ADVIL,MOTRIN) 200 MG tablet Take 200 mg by mouth every 6 (six) hours as needed.    [provider]  LYSINE PO Take by mouth daily.    [provider]  OMEPRAZOLE PO Take 40 mg by mouth daily. am    [provider]    Allergies Penicillins  Family History  Problem Relation Age of Onset  . Breast cancer Mother     Social History Social History   Tobacco Use  . Smoking status: Former Smoker    Packs/day: 0.25    Types: Cigarettes    Last attempt to quit: 01/2018    Years since quitting: 0.5  . Smokeless tobacco: Never Used  Substance Use Topics  .  Alcohol use: Yes    Alcohol/week: 3.0 standard drinks    Types: 3 Standard drinks or equivalent per week    Comment: occ  . Drug use: No    Review of Systems  Constitutional: No fever/chills. Respiratory: Denies shortness of breath. Gastrointestinal: No vomiting. Skin: Positive for right third toe wound and rash.    ____________________________________________   PHYSICAL EXAM:  VITAL SIGNS: ED Triage Vitals [08/06/18 0038]  Enc Vitals Group     BP 130/82     Pulse Rate (!) 102     Resp 18     Temp 97.9 F (36.6 C)     Temp Source Oral     SpO2 100 %     Weight 124 lb (56.2 kg)      Height 5\' 5"  (1.651 m)     Head Circumference      Peak Flow      Pain Score 6     Pain Loc      Pain Edu?      Excl. in GC?     Constitutional: Alert and oriented. Well appearing and in no acute distress. Eyes: Conjunctivae are normal.  Head: Atraumatic. Nose: No congestion/rhinnorhea. Mouth/Throat: Mucous membranes are moist.   Neck: Normal range of motion.  Cardiovascular:  Good peripheral circulation. Respiratory: Normal respiratory effort. Gastrointestinal: No distention.  Musculoskeletal:  Extremities warm and well perfused.  Neurologic:  Normal speech and language. No gross focal neurologic deficits are appreciated.  Skin:  Skin is warm and dry.  Right third toe with approximately 1.5 cm area of erythema and slight induration.  5 mm central healing superficial wound. Psychiatric: Mood and affect are normal. Speech and behavior are normal.  ____________________________________________   LABS (all labs ordered are listed, but only abnormal results are displayed)  Labs Reviewed - No data to display ____________________________________________  EKG   ____________________________________________  RADIOLOGY    ____________________________________________   PROCEDURES  Procedure(s) performed: No  Procedures  Critical Care performed: No ____________________________________________   INITIAL IMPRESSION / ASSESSMENT AND PLAN / ED COURSE  Pertinent labs & imaging results that were available during my care of the patient were reviewed by me and considered in my medical decision making (see chart for details).  27 year old female presents with small rash to dorsum of right third toe after a possible insect bite.  She has no fever or systemic symptoms, and no rash or erythema going up the leg.  I suspect most likely localized cellulitis or localized allergic reaction from an insect bite.  Also consider zoster although this is less likely since there are no  vesicles.  We will treat with p.o. Keflex.  Patient is stable for discharge home.  Return precautions given, and she expresses understanding. ____________________________________________   FINAL CLINICAL IMPRESSION(S) / ED DIAGNOSES  Final diagnoses:  Cellulitis of right toe      NEW MEDICATIONS STARTED DURING THIS VISIT:  New Prescriptions   No medications on file     Note:  This document was prepared using Dragon voice recognition software and may include unintentional dictation errors.    Dionne BucySiadecki, Joniece Smotherman, MD 08/06/18 [phone]

## 2019-06-18 ENCOUNTER — Other Ambulatory Visit: Payer: Self-pay | Admitting: Gastroenterology

## 2019-10-25 ENCOUNTER — Other Ambulatory Visit: Payer: Self-pay | Admitting: Gastroenterology

## 2019-12-21 ENCOUNTER — Telehealth: Payer: Self-pay | Admitting: Gastroenterology

## 2019-12-21 NOTE — Telephone Encounter (Signed)
Patient called & scheduled an appointment for 02-17-19 with Dr Allen Norris for a refill on dexlansoprazole ([place]) 60 MG capsule. This will need to be called into [facility] on [address] [place] [address] [phone]). Please call patient she has questions.

## 2019-12-22 ENCOUNTER — Other Ambulatory Visit: Payer: Self-pay

## 2019-12-22 MED ORDER — DEXILANT 60 MG PO CPDR: 60.0000 mg | DELAYED_RELEASE_CAPSULE | Freq: Every day | ORAL | 0 refills | Status: DC

## 2019-12-22 NOTE — Telephone Encounter (Signed)
Prescription for Dexilant 60mg  has been sent to pt's pharmacy. Pt advised this will be the last refill until an appt.

## 2020-02-18 ENCOUNTER — Encounter: Payer: Self-pay | Admitting: Gastroenterology

## 2020-02-18 ENCOUNTER — Ambulatory Visit: Payer: BLUE CROSS/BLUE SHIELD | Admitting: Gastroenterology

## 2020-02-18 NOTE — Progress Notes (Deleted)
    Primary Care Physician: Danella Penton, MD  Primary Gastroenterologist:  Dr. Midge Minium  No chief complaint on file.   HPI: Lydia Kennedy is a 29 y.o. female here for medication refill.  The patient has seen me in the past for an upper endoscopy and a history of globus.  The patient has been on Dexilant and need a refill.  Since she had not seen me in some time she is now coming in for an office follow-up and refill of her medication.  Past Medical History:  Diagnosis Date  . ADHD   . Anxiety   . Chronic kidney disease    kidney reflux, problem more as child/ recuurent interstitial cystitis  . GERD (gastroesophageal reflux disease)   . Headache    daily  . Infection    UTI  . Thyroid dysfunction   . Tonsillitis     Current Outpatient Medications  Medication Sig Dispense Refill  . ALPRAZolam (XANAX) 0.5 MG tablet Take 2 mg by mouth 3 (three) times daily as needed for anxiety.     Marland Kitchen amphetamine-dextroamphetamine (ADDERALL) 15 MG tablet Take 30 mg by mouth 3 (three) times daily. Only takes while in school    . Ascorbic Acid (VITAMIN C PO) Take by mouth daily.    . cetirizine (ZYRTEC) 10 MG tablet Take 10 mg by mouth daily.    . Cholecalciferol (VITAMIN D3 PO) Take by mouth daily.    Marland Kitchen dexlansoprazole (DEXILANT) 60 MG capsule Take 1 capsule (60 mg total) by mouth daily. 90 capsule 0  . ibuprofen (ADVIL,MOTRIN) 200 MG tablet Take 200 mg by mouth every 6 (six) hours as needed.    Marland Kitchen LYSINE PO Take by mouth daily.    Marland Kitchen OMEPRAZOLE PO Take 40 mg by mouth daily. am     No current facility-administered medications for this visit.    Allergies as of 02/18/2020 - Review Complete 08/06/2018  Allergen Reaction Noted  . Penicillins Anaphylaxis and Shortness Of Breath 01/01/2018    ROS:  General: Negative for anorexia, weight loss, fever, chills, fatigue, weakness. ENT: Negative for hoarseness, difficulty swallowing , nasal congestion. CV: Negative for chest pain, angina,  palpitations, dyspnea on exertion, peripheral edema.  Respiratory: Negative for dyspnea at rest, dyspnea on exertion, cough, sputum, wheezing.  GI: See history of present illness. GU:  Negative for dysuria, hematuria, urinary incontinence, urinary frequency, nocturnal urination.  Endo: Negative for unusual weight change.    Physical Examination:   There were no vitals taken for this visit.  General: Well-nourished, well-developed in no acute distress.  Eyes: No icterus. Conjunctivae pink. Lungs: Clear to auscultation bilaterally. Non-labored. Heart: Regular rate and rhythm, no murmurs rubs or gallops.  Abdomen: Bowel sounds are normal, nontender, nondistended, no hepatosplenomegaly or masses, no abdominal bruits or hernia , no rebound or guarding.   Extremities: No lower extremity edema. No clubbing or deformities. Neuro: Alert and oriented x 3.  Grossly intact. Skin: Warm and dry, no jaundice.   Psych: Alert and cooperative, normal mood and affect.  Labs:    Imaging Studies: No results found.  Assessment and Plan:   Lydia Kennedy is a 29 y.o. y/o female ***     Midge Minium, MD. Clementeen Graham    Note: This dictation was prepared with Dragon dictation along with smaller phrase technology. Any transcriptional errors that result from this process are unintentional.

## 2020-04-04 ENCOUNTER — Other Ambulatory Visit: Payer: Self-pay

## 2020-04-04 MED ORDER — DEXILANT 60 MG PO CPDR: 60.0000 mg | DELAYED_RELEASE_CAPSULE | Freq: Every day | ORAL | 1 refills | Status: DC

## 2020-06-01 ENCOUNTER — Encounter (HOSPITAL_COMMUNITY): Payer: Self-pay | Admitting: Emergency Medicine

## 2020-06-01 ENCOUNTER — Emergency Department (HOSPITAL_COMMUNITY): Payer: Self-pay

## 2020-06-01 ENCOUNTER — Other Ambulatory Visit: Payer: Self-pay

## 2020-06-01 ENCOUNTER — Emergency Department (HOSPITAL_COMMUNITY)
Admission: EM | Admit: 2020-06-01 | Discharge: 2020-06-01 | Disposition: A | Discharge status: Home / Self Care | Payer: Self-pay | Attending: Emergency Medicine | Admitting: Emergency Medicine

## 2020-06-01 DIAGNOSIS — R531 Weakness: Secondary | ICD-10-CM | POA: Insufficient documentation

## 2020-06-01 DIAGNOSIS — G8929 Other chronic pain: Secondary | ICD-10-CM | POA: Insufficient documentation

## 2020-06-01 DIAGNOSIS — Z87891 Personal history of nicotine dependence: Secondary | ICD-10-CM | POA: Insufficient documentation

## 2020-06-01 DIAGNOSIS — N189 Chronic kidney disease, unspecified: Secondary | ICD-10-CM | POA: Insufficient documentation

## 2020-06-01 DIAGNOSIS — R519 Headache, unspecified: Secondary | ICD-10-CM | POA: Insufficient documentation

## 2020-06-01 LAB — BASIC METABOLIC PANEL
Anion gap: 12 (ref 5–15)
BUN: 11 mg/dL (ref 6–20)
CO2: 29 mmol/L (ref 22–32)
Calcium: 8.5 mg/dL — ABNORMAL LOW (ref 8.9–10.3)
Chloride: 101 mmol/L (ref 98–111)
Creatinine, Ser: 0.76 mg/dL (ref 0.44–1.00)
GFR calc Af Amer: >60 mL/min (ref >60)
GFR calc non Af Amer: >60 mL/min (ref >60)
Glucose, Bld: 109 mg/dL — ABNORMAL HIGH (ref 70–99)
Potassium: 3.8 mmol/L (ref 3.5–5.1)
Sodium: 142 mmol/L (ref 135–145)

## 2020-06-01 LAB — I-STAT BETA HCG BLOOD, ED (MC, WL, AP ONLY): I-stat hCG, quantitative: <5 m[IU]/mL (ref <5)

## 2020-06-01 LAB — CBC
HCT: 38.9 % (ref 36.0–46.0)
Hemoglobin: 13.7 g/dL (ref 12.0–15.0)
MCH: 33.3 pg (ref 26.0–34.0)
MCHC: 35.2 g/dL (ref 30.0–36.0)
MCV: 94.4 fL (ref 80.0–100.0)
Platelets: 265 10*3/uL (ref 150–400)
RBC: 4.12 MIL/uL (ref 3.87–5.11)
RDW: 20.4 % — ABNORMAL HIGH (ref 11.5–15.5)
WBC: 4.8 10*3/uL (ref 4.0–10.5)
nRBC: 0 % (ref 0.0–0.2)

## 2020-06-01 LAB — URINALYSIS, ROUTINE W REFLEX MICROSCOPIC
Bacteria, UA: NONE SEEN
Bilirubin Urine: NEGATIVE
Glucose, UA: NEGATIVE mg/dL
Ketones, ur: NEGATIVE mg/dL
Leukocytes,Ua: NEGATIVE
Nitrite: NEGATIVE
Protein, ur: NEGATIVE mg/dL
Specific Gravity, Urine: 1.024 (ref 1.005–1.030)
pH: 6 (ref 5.0–8.0)

## 2020-06-01 MED ORDER — IBUPROFEN 200 MG PO TABS
600.0000 mg | ORAL_TABLET | Freq: Once | ORAL | Status: AC
  Administered 2020-06-01: 600 mg via ORAL
  Filled 2020-06-01: qty 3

## 2020-06-01 MED ORDER — PROCHLORPERAZINE MALEATE 10 MG PO TABS
10.0000 mg | ORAL_TABLET | Freq: Once | ORAL | Status: DC
  Filled 2020-06-01: qty 1

## 2020-06-01 MED ORDER — DIPHENHYDRAMINE HCL 25 MG PO CAPS
25.0000 mg | ORAL_CAPSULE | Freq: Once | ORAL | Status: AC
  Administered 2020-06-01: 25 mg via ORAL
  Filled 2020-06-01: qty 1

## 2020-06-01 NOTE — Discharge Instructions (Addendum)
You were evaluated in the Emergency Department and after careful evaluation, we did not find any emergent condition requiring admission or further testing in the hospital.  Your exam/testing today was overall reassuring.  Your labs and CT scan was very reassuring today.  Please call the neurology office to schedule an appointment to further discuss your headaches.  Please return to the Emergency Department if you experience any worsening of your condition.  We encourage you to follow up with a primary care provider.  Thank you for allowing [place] to be a part of your care.

## 2020-06-01 NOTE — ED Provider Notes (Signed)
[facility]-EMERGENCY DEPT [facility] Emergency Department Provider Note MRN:  [id number]  Arrival date & time: 06/01/20     Chief Complaint   Weakness and Headache   History of Present Illness   Lydia Kennedy is a 29 y.o. year-old female with a history of CKD presenting to the ED with chief complaint of headache.  2 months of worsening and more frequent headaches.  Has had headaches on and off for years but this is different.  Described as a pressure-like pain, currently 5 out of 10 in severity, located in the occipital region.  Fairly constant for the past 2 months.  Worse with bright lights and loud noises.  Denies nausea or vomiting, no vision change, no neck pain, no fever, no chest pain, no shortness of breath, no abdominal pain, no numbness or weakness to the arms or legs, feels generally weak.  Review of Systems  A complete 10 system review of systems was obtained and all systems are negative except as noted in the HPI and PMH.   Patient's Health History    Past Medical History:  Diagnosis Date  . ADHD   . Anxiety   . Chronic kidney disease    kidney reflux, problem more as child/ recuurent interstitial cystitis  . GERD (gastroesophageal reflux disease)   . Headache    daily  . Infection    UTI  . Thyroid dysfunction   . Tonsillitis     Past Surgical History:  Procedure Laterality Date  . BREAST SURGERY     augmentation  . CESAREAN SECTION    . ESOPHAGOGASTRODUODENOSCOPY (EGD) WITH PROPOFOL N/A 07/07/2018   Procedure: ESOPHAGOGASTRODUODENOSCOPY (EGD) WITH PROPOFOL;  Surgeon: Midge Minium, MD;  Location: [facility];  Service: Endoscopy;  Laterality: N/A;  . TONSILLECTOMY N/A 01/29/2018   Procedure: TONSILLECTOMY;  Surgeon: Bud Face, MD;  Location: [facility];  Service: ENT;  Laterality: N/A;  UPREG    Family History  Problem Relation Age of Onset  . Breast cancer Mother     Social History   Socioeconomic History  . Marital  status: Single    Spouse name: Not on file  . Number of children: Not on file  . Years of education: Not on file  . Highest education level: Not on file  Occupational History  . Not on file  Tobacco Use  . Smoking status: Former Smoker    Packs/day: 0.25    Types: Cigarettes    Quit date: 01/2018    Years since quitting: 2.3  . Smokeless tobacco: Never Used  Substance and Sexual Activity  . Alcohol use: Yes    Alcohol/week: 3.0 standard drinks    Types: 3 Standard drinks or equivalent per week    Comment: occ  . Drug use: No  . Sexual activity: Yes    Birth control/protection: None  Other Topics Concern  . Not on file  Social History Narrative  . Not on file   Social Determinants of Health   Financial Resource Strain:   . Difficulty of Paying Living Expenses:   Food Insecurity:   . Worried About Programme researcher, broadcasting/film/video in the Last Year:   . Barista in the Last Year:   Transportation Needs:   . Freight forwarder (Medical):   Marland Kitchen Lack of Transportation (Non-Medical):   Physical Activity:   . Days of Exercise per Week:   . Minutes of Exercise per Session:   Stress:   . Feeling of Stress :  Social Connections:   . Frequency of Communication with Friends and Family:   . Frequency of Social Gatherings with Friends and Family:   . Attends Religious Services:   . Active Member of Clubs or Organizations:   . Attends Banker Meetings:   Marland Kitchen Marital Status:   Intimate Partner Violence:   . Fear of Current or Ex-Partner:   . Emotionally Abused:   Marland Kitchen Physically Abused:   . Sexually Abused:      Physical Exam   Vitals:   06/01/20 2030 06/01/20 2100  BP: (!) 138/96 (!) 142/96  Pulse: 68 89  Resp: 16 15  Temp:    SpO2: 98% 99%    CONSTITUTIONAL: Well-appearing, NAD NEURO:  Alert and oriented x 3, normal and symmetric strength and sensation, normal coordination, normal speech, no visual field cuts, no nystagmus, no meningismus EYES:  eyes equal and  reactive ENT/NECK:  no LAD, no JVD CARDIO: Regular rate, well-perfused, normal S1 and S2 PULM:  CTAB no wheezing or rhonchi GI/GU:  normal bowel sounds, non-distended, non-tender MSK/SPINE:  No gross deformities, no edema SKIN:  no rash, atraumatic PSYCH:  Appropriate speech and behavior  *Additional and/or pertinent findings included in MDM below  Diagnostic and Interventional Summary    EKG Interpretation  Date/Time:    Ventricular Rate:    PR Interval:    QRS Duration:   QT Interval:    QTC Calculation:   R Axis:     Text Interpretation:        Labs Reviewed  BASIC METABOLIC PANEL - Abnormal; Notable for the following components:      Result Value   Glucose, Bld 109 (*)    Calcium 8.5 (*)    All other components within normal limits  CBC - Abnormal; Notable for the following components:   RDW 20.4 (*)    All other components within normal limits  URINALYSIS, ROUTINE W REFLEX MICROSCOPIC - Abnormal; Notable for the following components:   Hgb urine dipstick LARGE (*)    All other components within normal limits  I-STAT BETA HCG BLOOD, ED ([facility], [facility], [facility] ONLY)    CT HEAD WO CONTRAST  Final Result      Medications  prochlorperazine (COMPAZINE) tablet 10 mg (10 mg Oral Not Given 06/01/20 1955)  diphenhydrAMINE (BENADRYL) capsule 25 mg (25 mg Oral Given 06/01/20 1955)  ibuprofen (ADVIL) tablet 600 mg (600 mg Oral Given 06/01/20 1955)     Procedures  /  Critical Care Procedures  ED Course and Medical Decision Making  I have reviewed the triage vital signs, the nursing notes, and pertinent available records from the EMR.  Listed above are laboratory and imaging tests that I personally ordered, reviewed, and interpreted and then considered in my medical decision making (see below for details).      Chronic headache for 2 months, CT to exclude neoplastic process.  Reassuring neurological exam, no fever, no meningismus, if CT reassuring, would be appropriate for discharge  and referral to headache specialist.  Doubt subarachnoid given the gradual onset and chronicity, doubt any other vascular etiology.  Possibly stress related.    Elmer Sow. Pilar Plate, MD [facility] Emergency Medicine [facility] [email or web address]  Final Clinical Impressions(s) / ED Diagnoses     ICD-10-CM   1. Chronic nonintractable headache, unspecified headache type  R51.9    G89.29     ED Discharge Orders    None       Discharge Instructions Discussed  with and Provided to Patient:     Discharge Instructions     You were evaluated in the Emergency Department and after careful evaluation, we did not find any emergent condition requiring admission or further testing in the hospital.  Your exam/testing today was overall reassuring.  Your labs and CT scan was very reassuring today.  Please call the neurology office to schedule an appointment to further discuss your headaches.  Please return to the Emergency Department if you experience any worsening of your condition.  We encourage you to follow up with a primary care provider.  Thank you for allowing [place] to be a part of your care.        Maudie Flakes, MD 06/01/20 2200

## 2020-06-01 NOTE — ED Triage Notes (Signed)
Patient c/o weakness and intermittent headache x1 month. Reports seen at [facility] x1 month ago and dx with anemia. Denies chest pain and SOB. Requesting pregnancy test, LMP 5/20.

## 2020-06-01 NOTE — ED Notes (Signed)
Patient transported to CT 

## 2020-07-06 ENCOUNTER — Encounter: Payer: Self-pay | Admitting: Obstetrics & Gynecology

## 2020-07-06 ENCOUNTER — Other Ambulatory Visit (HOSPITAL_COMMUNITY)
Admission: RE | Admit: 2020-07-06 | Discharge: 2020-07-06 | Disposition: A | Discharge status: Home / Self Care | Payer: Medicaid Other | Source: Ambulatory Visit | Attending: Obstetrics & Gynecology | Admitting: Obstetrics & Gynecology

## 2020-07-06 ENCOUNTER — Ambulatory Visit (INDEPENDENT_AMBULATORY_CARE_PROVIDER_SITE_OTHER): Payer: Self-pay | Admitting: Obstetrics & Gynecology

## 2020-07-06 ENCOUNTER — Other Ambulatory Visit: Payer: Self-pay

## 2020-07-06 VITALS — BP 120/80 | Ht 65.0 in | Wt 133.0 lb

## 2020-07-06 DIAGNOSIS — N921 Excessive and frequent menstruation with irregular cycle: Secondary | ICD-10-CM | POA: Insufficient documentation

## 2020-07-06 DIAGNOSIS — N841 Polyp of cervix uteri: Secondary | ICD-10-CM

## 2020-07-06 DIAGNOSIS — Z124 Encounter for screening for malignant neoplasm of cervix: Secondary | ICD-10-CM | POA: Diagnosis not present

## 2020-07-06 MED ORDER — MEDROXYPROGESTERONE ACETATE 10 MG PO TABS: 10.0000 mg | ORAL_TABLET | Freq: Every day | ORAL | 0 refills | Status: DC

## 2020-07-06 NOTE — Patient Instructions (Signed)
Thank you for choosing [facility]. As part of our ongoing efforts to improve patient experience, we would appreciate your feedback. Please fill out the short survey that you will receive by mail or MyChart. Your opinion is important to [place]! -Dr Tiburcio Pea   Transvaginal Ultrasound A transvaginal ultrasound, also called an endovaginal ultrasound, is a test that uses sound waves to take pictures of the female genital tract. The pictures are taken with a device, called a transducer, that is placed in the vagina. This test may be done to:  Check for problems with your pregnancy.  Check your developing baby.  Check for anything abnormal in the uterus or ovaries.  Find out why you have pelvic pain or bleeding. Tell a health care provider about:  Any allergies you have.  All medicines you are taking, including vitamins, herbs, eye drops, creams, and over-the-counter medicines.  Any blood disorders you have.  Any surgeries you have had.  Any medical conditions you have.  Whether you are pregnant or may be pregnant.  Whether you are having your menstrual period. What are the risks? This is a safe procedure. There are no known risks or complications of having this test. What happens before the procedure? This procedure needs to be done when your bladder is empty. Follow your health care provider's instructions about drinking fluids and emptying your bladder before the test. What happens during the procedure?   You will empty your bladder before the procedure.  You will undress from the waist down.  You will lie down on an exam table, with your knees bent and your feet in foot holders.  A health care provider will cover the transducer with a sterile cover.  A gel will be put on the transducer. The gel helps transmit the sound waves and prevents irritation of your vagina.  The technician will insert the transducer into your vagina to get images. These will be displayed on a monitor  that looks like a small television screen.  The transducer will be removed when the procedure is complete. The procedure may vary among health care providers and hospitals. What happens after the procedure?  It is up to you to get the results of your procedure. Ask your health care provider, or the department that is doing the procedure, when your results will be ready.  Keep all follow-up visits as told by your health care provider. This is important. Summary  A transvaginal ultrasound, also called an endovaginal ultrasound, is a test that uses sound waves to take pictures of the female genital tract.  This is a safe procedure. There are no known risks associated with this test.  The procedure needs to be done when your bladder is empty. Follow your health care provider's instructions about drinking fluids and emptying your bladder before the test.  During the procedure, you will undress from the waist down and lie down on an exam table. A technician will insert a transducer into your vagina to obtain images.  Ask your health care provider, or the department that is doing the procedure, when your results will be ready. This information is not intended to replace advice given to you by your health care provider. Make sure you discuss any questions you have with your health care provider. Document Revised: 07/23/2018 Document Reviewed: 07/23/2018 Elsevier Patient Education  2020 [employer].

## 2020-07-06 NOTE — Progress Notes (Signed)
HPI:      [name] is a 29 y.o. G1P1001 who LMP was Patient's last menstrual period was 02/22/2020., presents today for a problem visit.  She complains of menometrorrhagia that  began several months ago and its severity is described as severe.  She has had reg periods but then missed a period in Jan w spotting In Feb and then period in Mar that has kept bleeding irregularily but frequently ever since; associated w fatigue, nausea.  No modifiers.  Thought she may be pregnant in Dec (?urine test)/  Neg beta hCG in June (ER visit for h/a).  No recent hormone use; prior use aggravated IC per pt Has 29 yo.  PMHx: She  has a past medical history of ADHD, Anemia, Anxiety, Chronic kidney disease, GERD (gastroesophageal reflux disease), Headache, IC (interstitial cystitis), Infection, Thyroid dysfunction, and Tonsillitis. Also,  has a past surgical history that includes Cesarean section; Breast surgery; Tonsillectomy (N/A, 01/29/2018); and Esophagogastroduodenoscopy (egd) with propofol (N/A, 07/07/2018)., family history includes Breast cancer in her mother; Hypertension in her father.,  reports that she quit smoking about 2 years ago. Her smoking use included cigarettes. She smoked 0.25 packs per day. She has never used smokeless tobacco. She reports current alcohol use of about 3.0 standard drinks of alcohol per week. She reports that she does not use drugs.  She  Current Outpatient Medications:  .  ALPRAZolam (XANAX) 0.5 MG tablet, Take 2 mg by mouth 3 (three) times daily as needed for anxiety. , Disp: , Rfl:  .  Ascorbic Acid (VITAMIN C PO), Take by mouth daily., Disp: , Rfl:  .  cetirizine (ZYRTEC) 10 MG tablet, Take 10 mg by mouth daily., Disp: , Rfl:  .  Cholecalciferol (VITAMIN D3 PO), Take by mouth daily., Disp: , Rfl:  .  ibuprofen (ADVIL,MOTRIN) 200 MG tablet, Take 200 mg by mouth every 6 (six) hours as needed., Disp: , Rfl:  .  LYSINE PO, Take by mouth daily., Disp: , Rfl:  .   OMEPRAZOLE PO, Take 40 mg by mouth daily. am, Disp: , Rfl:  .  amphetamine-dextroamphetamine (ADDERALL) 15 MG tablet, Take 30 mg by mouth 3 (three) times daily. Only takes while in school (Patient not taking: Reported on 07/06/2020), Disp: , Rfl:  .  dexlansoprazole (DEXILANT) 60 MG capsule, Take 1 capsule (60 mg total) by mouth daily. (Patient not taking: Reported on 07/06/2020), Disp: 90 capsule, Rfl: 1 .  medroxyPROGESTERone (PROVERA) 10 MG tablet, Take 1 tablet (10 mg total) by mouth daily for 10 days., Disp: 10 tablet, Rfl: 0  Also, is allergic to penicillins.  Review of Systems  Constitutional: Positive for malaise/fatigue. Negative for chills and fever.  HENT: Negative for congestion, sinus pain and sore throat.   Eyes: Negative for blurred vision and pain.  Respiratory: Negative for cough and wheezing.   Cardiovascular: Negative for chest pain and leg swelling.  Gastrointestinal: Positive for abdominal pain and nausea. Negative for constipation, diarrhea, heartburn and vomiting.  Genitourinary: Negative for dysuria, frequency, hematuria and urgency.  Musculoskeletal: Negative for back pain, joint pain, myalgias and neck pain.  Skin: Negative for itching and rash.  Neurological: Negative for dizziness, tremors and weakness.  Endo/Heme/Allergies: Does not bruise/bleed easily.  Psychiatric/Behavioral: Negative for depression. The patient is not nervous/anxious and does not have insomnia.     Objective: BP 120/80   Ht 5\' 5"  (1.651 m)   Wt 133 lb (60.3 kg)   LMP 02/22/2020   BMI 22.13  kg/m  Physical Exam Constitutional:      General: She is not in acute distress.    Appearance: She is well-developed.  Genitourinary:     Pelvic exam was performed with patient supine.     Vagina and uterus normal.     No vaginal erythema or bleeding.     No cervical motion tenderness, discharge, polyp or nabothian cyst.        Uterus is mobile.     Uterus is not enlarged.     No uterine mass  detected.    Uterus is midaxial.     No right or left adnexal mass present.     Right adnexa not tender.     Left adnexa not tender.  HENT:     Head: Normocephalic and atraumatic.     Nose: Nose normal.  Abdominal:     General: There is no distension.     Palpations: Abdomen is soft.     Tenderness: There is no abdominal tenderness.  Musculoskeletal:        General: Normal range of motion.  Neurological:     Mental Status: She is alert and oriented to person, place, and time.     Cranial Nerves: No cranial nerve deficit.  Skin:    General: Skin is warm and dry.  Psychiatric:        Attention and Perception: Attention normal.        Mood and Affect: Mood and affect normal.        Speech: Speech normal.        Behavior: Behavior normal.        Thought Content: Thought content normal.        Judgment: Judgment normal.     ASSESSMENT/PLAN:  menometrorrhagia  Problem List Items Addressed This Visit    Cervical Polyp Menometrorrhagia      Relevant Orders   US PELVIC COMPLETE WITH TRANSVAGINAL   Screening for cervical cancer       Relevant Orders   Cytology - PAP    PAP as is due.   Pt concerned as has [place] Medicaid about costs of care Polyp seen on exam [place] to assess for additional polyps OR for D&C rec'd due to size of polyp    Will discuss after [place] Provera to help address prolonged bleeding, in case there is also a hormonal component  Annamarie Major, MD, [facility], [facility] 07/06/2020  4:06 PM

## 2020-07-11 ENCOUNTER — Telehealth: Payer: Self-pay

## 2020-07-11 LAB — CYTOLOGY - PAP: Diagnosis: NEGATIVE

## 2020-07-11 NOTE — Telephone Encounter (Signed)
Pt calling reporting that she has results for a Pap in her My chart and she did not have a pap done, I advised her that in the chart it seemed we did do a pap. Pt wants to speak With [facility] about pap results. Pt is aware [facility] is out of the office. Please advise

## 2020-07-13 ENCOUNTER — Encounter: Payer: Self-pay | Admitting: Obstetrics and Gynecology

## 2020-07-17 NOTE — Telephone Encounter (Signed)
Let her know PAP was done at her last visit and it was normal.  I just sent a MyChart message to her about this.  PAP was done because it was due (yearly), and she had an abnormal cervical exam (polyp), and is covered typically by [facility] Medicaid and other insurance/ carriers.  See if she has additional questions.  Still prefer her to have ultrasound to follow up on polyps.

## 2020-07-18 ENCOUNTER — Telehealth: Payer: Self-pay

## 2020-07-18 NOTE — Telephone Encounter (Signed)
Pt aware.

## 2020-07-18 NOTE — Telephone Encounter (Signed)
Pt called; has questions about appt tomorrow.  [phone]  Pt states the medication gave her weird side effects and she was told at first to stop taking then told to keep taking it.  Her bleeding stopped on day 2 of the medications and started back on day 9.  She didn't take the last dose b/c she had started bleeding the day before.  Can the u/s still be done if she is bleeding?

## 2020-07-18 NOTE — Telephone Encounter (Signed)
Called pt no answer, mailbox is full

## 2020-07-19 ENCOUNTER — Ambulatory Visit (INDEPENDENT_AMBULATORY_CARE_PROVIDER_SITE_OTHER): Payer: Self-pay | Admitting: Obstetrics & Gynecology

## 2020-07-19 ENCOUNTER — Other Ambulatory Visit: Payer: Self-pay

## 2020-07-19 ENCOUNTER — Ambulatory Visit (INDEPENDENT_AMBULATORY_CARE_PROVIDER_SITE_OTHER): Payer: Self-pay

## 2020-07-19 ENCOUNTER — Encounter: Payer: Self-pay | Admitting: Obstetrics & Gynecology

## 2020-07-19 VITALS — BP 120/80 | Ht 64.0 in | Wt 131.0 lb

## 2020-07-19 DIAGNOSIS — N921 Excessive and frequent menstruation with irregular cycle: Secondary | ICD-10-CM

## 2020-07-19 DIAGNOSIS — N3010 Interstitial cystitis (chronic) without hematuria: Secondary | ICD-10-CM

## 2020-07-19 MED ORDER — FLAVOXATE HCL 100 MG PO TABS
100.0000 mg | ORAL_TABLET | Freq: Three times a day (TID) | ORAL | 1 refills | Status: DC | PRN
Start: 2020-07-19 — End: 2022-11-22

## 2020-07-19 NOTE — Progress Notes (Signed)
  HPI: Pt stopped bleeding then restared on Provera.  No pain.  She does report IC pain at times.  Elmiron in past.  Recent Azo use that helps some.  No recetn urology visit.  Polyp seen on cervix on exam last visit.  [place] normal, no intrauterine polyps seen.  PMHx: She  has a past medical history of ADHD, Anemia, Anxiety, Chronic kidney disease, Family history of breast cancer, GERD (gastroesophageal reflux disease), Headache, IC (interstitial cystitis), Infection, Thyroid dysfunction, and Tonsillitis. Also,  has a past surgical history that includes Cesarean section; Breast surgery; Tonsillectomy (N/A, 01/29/2018); and Esophagogastroduodenoscopy (egd) with propofol (N/A, 07/07/2018)., family history includes Breast cancer in her mother; Hypertension in her father; Pancreatic cancer in her maternal grandmother.,  reports that she quit smoking about 2 years ago. Her smoking use included cigarettes. She smoked 0.25 packs per day. She has never used smokeless tobacco. She reports current alcohol use of about 3.0 standard drinks of alcohol per week. She reports that she does not use drugs.  She has a current medication list which includes the following prescription(s): alprazolam, amphetamine-dextroamphetamine, ascorbic acid, cetirizine, cholecalciferol, dexilant, ibuprofen, lysine, omeprazole, flavoxate, and medroxyprogesterone. Also, is allergic to penicillins.  Review of Systems  All other systems reviewed and are negative.   Objective: BP 120/80   Ht 5\' 4"  (1.626 m)   Wt 131 lb (59.4 kg)   LMP 02/22/2020   BMI 22.49 kg/m   Physical examination Constitutional NAD, Conversant  Skin No rashes, lesions or ulceration.   Extremities: Moves all appropriately.  Normal ROM for age. No lymphadenopathy.  Neuro: Grossly intact  Psych: Oriented to PPT.  Normal mood. Normal affect.   04/23/2020 PELVIC COMPLETE WITH TRANSVAGINAL  Result Date: 07/19/2020 Patient Name: Lydia Kennedy DOB: 10-25-1991 MRN:  1991 ULTRASOUND REPORT Location: [facility] Date of Service: 07/19/2020 Indications:Abnormal Uterine Bleeding Findings: The uterus is axial and measures 7.1 x 5.0 x 3.5 xm. Echo texture is homogenous without evidence of focal masses. The Endometrium measures 5.9 mm. There is a nabothian cyst in the cervix measuring 19.4 x 9.2 x 14.5 mm. Right Ovary measures 2.5 x 2.2 x 2.1 cm. It is normal in appearance. Left Ovary measures 2.7 x 2.0 x 2.2 cm. It is normal in appearance. Survey of the adnexa demonstrates no adnexal masses. There is no free fluid in the cul de sac. Impression: 1. Normal pelvic ultrasound. Recommendations: 1.Clinical correlation with the patient's History and Physical Exam. 07/21/2020, RT Review of ULTRASOUND.    I have personally reviewed images and report of recent ultrasound done at [facility].    Plan of management to be discussed with patient. [facility], MD, Annamarie Major Ob/Gyn, [facility] 07/19/2020  4:16 PM   Assessment:  Interstitial cystitis  Menometrorrhagia  Plan Urispas to help w IC.07/21/2020 Elmiron no longer available. Urology as next option. Rx  Monitor cycles off of Provera.  Cervical polypectomy as next option, as is somewhat large.  OCps or other hormonal also a future option    A total of 20 minutes were spent face-to-face with the patient as well as preparation, review, communication, and documentation during this encounter.   Marland Kitchen, MD, Annamarie Major Ob/Gyn, [facility] 07/19/2020  4:28 PM

## 2020-07-21 ENCOUNTER — Telehealth: Payer: Self-pay | Admitting: Obstetrics & Gynecology

## 2020-07-21 NOTE — Telephone Encounter (Signed)
Patient is calling to speak with a nurse about her abnormal bleeding. Patient was seen with RPH on 07/19/20. Patient reports she is having increased bleeding and would like someone to give her a call.

## 2020-07-21 NOTE — Telephone Encounter (Signed)
Pt states the bleeding is heavy and worse than before, she was told to let [facility] know if it has become heavy.

## 2020-07-21 NOTE — Telephone Encounter (Signed)
Can you call and schedule?

## 2020-07-21 NOTE — Telephone Encounter (Signed)
Let her know we can schedule for procedure (remove polyp) next week if desires.  This seems to be needed.

## 2020-07-21 NOTE — Telephone Encounter (Signed)
Attempt to reach patient to schedule appointment. voicemail box is full unable to leave message to call back to be scheduled

## 2020-07-29 ENCOUNTER — Telehealth: Payer: Self-pay | Admitting: Obstetrics & Gynecology

## 2020-07-29 NOTE — Telephone Encounter (Signed)
Pt called back after seeing missed call. She is still interested in having the surgery but is in process of getting Medicaid. She hopes to have it starting 9/1 and will be calling me to schedule.

## 2020-08-22 ENCOUNTER — Telehealth: Payer: Self-pay | Admitting: Obstetrics & Gynecology

## 2020-08-22 ENCOUNTER — Telehealth: Payer: Self-pay

## 2020-08-22 NOTE — Telephone Encounter (Signed)
Patient is calling with elevated blood pressure 150/105  Pulse 126. Patient is wanting to know if the hormone she is could be causing this evaluated Blood pressure and heart rate. Patient reports she still hasn't had a menstrual cycle this month. Please advise

## 2020-08-22 NOTE — Telephone Encounter (Signed)
Pt advised to go to ER, she reports h/a blurred vision,dizziness, her eyes hurt. BP 150/105. States she has missed a period, wanted to know if the Provera can mess up her cycle. I told Pt yes, but she can take a pregnancy test to make sure, she wanted to see What [facility] thought.. Please advise

## 2020-08-22 NOTE — Telephone Encounter (Signed)
Pt calling triage today wanting to speak with [facility] or his nurse about her bleeding and "how she is feeling". fwding to JP.

## 2020-08-23 NOTE — Telephone Encounter (Signed)
Called pt no answer. Left message to go to ER or PCP for BP.

## 2020-08-23 NOTE — Telephone Encounter (Signed)
Go to hospital or primary doctor right away.  Hormones can not cause high blood pressures and these BPs may be harmful.

## 2020-12-22 ENCOUNTER — Ambulatory Visit: Payer: Medicaid Other | Admitting: Obstetrics & Gynecology

## 2021-01-09 ENCOUNTER — Ambulatory Visit: Payer: Medicaid Other | Admitting: Obstetrics & Gynecology

## 2021-04-05 ENCOUNTER — Encounter (HOSPITAL_COMMUNITY): Payer: Self-pay | Admitting: Emergency Medicine

## 2021-04-05 ENCOUNTER — Ambulatory Visit (INDEPENDENT_AMBULATORY_CARE_PROVIDER_SITE_OTHER): Payer: Medicaid Other

## 2021-04-05 ENCOUNTER — Ambulatory Visit (HOSPITAL_COMMUNITY)
Admission: EM | Admit: 2021-04-05 | Discharge: 2021-04-05 | Disposition: A | Discharge status: Home / Self Care | Payer: Medicaid Other | Attending: Emergency Medicine | Admitting: Emergency Medicine

## 2021-04-05 DIAGNOSIS — U071 COVID-19: Secondary | ICD-10-CM | POA: Diagnosis not present

## 2021-04-05 DIAGNOSIS — R062 Wheezing: Secondary | ICD-10-CM | POA: Diagnosis not present

## 2021-04-05 DIAGNOSIS — Z87891 Personal history of nicotine dependence: Secondary | ICD-10-CM | POA: Diagnosis not present

## 2021-04-05 DIAGNOSIS — R0602 Shortness of breath: Secondary | ICD-10-CM | POA: Diagnosis not present

## 2021-04-05 DIAGNOSIS — Z880 Allergy status to penicillin: Secondary | ICD-10-CM | POA: Insufficient documentation

## 2021-04-05 DIAGNOSIS — R059 Cough, unspecified: Secondary | ICD-10-CM

## 2021-04-05 DIAGNOSIS — Z793 Long term (current) use of hormonal contraceptives: Secondary | ICD-10-CM | POA: Insufficient documentation

## 2021-04-05 DIAGNOSIS — J029 Acute pharyngitis, unspecified: Secondary | ICD-10-CM | POA: Diagnosis not present

## 2021-04-05 LAB — SARS CORONAVIRUS 2 (TAT 6-24 HRS): SARS Coronavirus 2: POSITIVE — AB

## 2021-04-05 MED ORDER — AZITHROMYCIN 250 MG PO TABS
ORAL_TABLET | ORAL | 0 refills | Status: AC
Start: 2021-04-05 — End: 2021-04-10

## 2021-04-05 MED ORDER — PREDNISONE 20 MG PO TABS: 40.0000 mg | ORAL_TABLET | Freq: Every day | ORAL | 0 refills | Status: AC

## 2021-04-05 NOTE — Discharge Instructions (Signed)
Your chest xray looks well today which is reassuring.  I have still sent antibiotics which should treat any bacterial infection if present.  Throat lozenges, gargles, chloraseptic spray, warm teas, popsicles etc to help with throat pain.   5 days of prednisone to help with your cough symptoms.  Over the counter medications as needed for symptoms.  If symptoms worsen or do not improve in the next week to return to be seen or to follow up with your PCP.

## 2021-04-05 NOTE — ED Provider Notes (Signed)
[facility]    CSN: [id number] Arrival date & time: 04/05/21  1435      History   Chief Complaint Chief Complaint  Patient presents with  . Adenopathy  . Sore Throat  . Cough    HPI Lydia Kennedy is a 30 y.o. female.   Lydia Kennedy presents with complaints of cough for the past month. Over the past few days has had sore throat, nasal drainage and low grade fevers as well. No known ill contacts. No gi symptoms. No previous history of covid-19. Over the counter medications have not helped with symptoms. Has had tonsils removed.    ROS per HPI, negative if not otherwise mentioned.      Past Medical History:  Diagnosis Date  . ADHD   . Anemia   . Anxiety   . Chronic kidney disease    kidney reflux, problem more as child/ recuurent interstitial cystitis  . Family history of breast cancer    7/21 cancer genetic testing letter sent  . GERD (gastroesophageal reflux disease)   . Headache    daily  . IC (interstitial cystitis)   . Infection    UTI  . Thyroid dysfunction   . Tonsillitis     Patient Active Problem List   Diagnosis Date Noted  . Other somatoform disorders   . Gastroesophageal reflux disease without esophagitis   . Acute gastritis without hemorrhage   . Generalized anxiety disorder 05/04/2016  . Acute upper respiratory infection 05/04/2016  . Low TSH level 05/04/2016  . Low back pain 05/04/2016    Past Surgical History:  Procedure Laterality Date  . BREAST SURGERY     augmentation  . CESAREAN SECTION    . ESOPHAGOGASTRODUODENOSCOPY (EGD) WITH PROPOFOL N/A 07/07/2018   Procedure: ESOPHAGOGASTRODUODENOSCOPY (EGD) WITH PROPOFOL;  Surgeon: Midge Minium, MD;  Location: [facility];  Service: Endoscopy;  Laterality: N/A;  . TONSILLECTOMY N/A 01/29/2018   Procedure: TONSILLECTOMY;  Surgeon: Bud Face, MD;  Location: [facility];  Service: ENT;  Laterality: N/A;  UPREG    OB History    Gravida  1    Para  1   Term  1   Preterm      AB      Living  1     SAB      IAB      Ectopic      Multiple      Live Births  1            Home Medications    Prior to Admission medications   Medication Sig Start Date End Date Taking? Authorizing Provider  azithromycin (ZITHROMAX) 250 MG tablet Take 2 tablets (500 mg total) by mouth daily for 1 day, THEN 1 tablet (250 mg total) daily for 4 days. 04/05/21 04/10/21 Yes Raynaldo Falco, Barron Alvine, NP  predniSONE (DELTASONE) 20 MG tablet Take 2 tablets (40 mg total) by mouth daily with breakfast for 5 days. 04/05/21 04/10/21 Yes Nissan Frazzini, Barron Alvine, NP  ALPRAZolam Prudy Feeler) 0.5 MG tablet Take 2 mg by mouth 3 (three) times daily as needed for anxiety.     [provider]  amphetamine-dextroamphetamine (ADDERALL) 15 MG tablet Take 30 mg by mouth 3 (three) times daily. Only takes while in school    [provider]  Ascorbic Acid (VITAMIN C PO) Take by mouth daily.    [provider]  cetirizine (ZYRTEC) 10 MG tablet Take 10 mg by mouth daily.  [provider]  Cholecalciferol (VITAMIN D3 PO) Take by mouth daily.    [provider]  dexlansoprazole (DEXILANT) 60 MG capsule Take 1 capsule (60 mg total) by mouth daily. 04/04/20   Midge Minium, MD  flavoxATE (URISPAS) 100 MG tablet Take 1 tablet (100 mg total) by mouth 3 (three) times daily as needed for bladder spasms (bladder spasm). 07/19/20   Nadara Mustard, MD  ibuprofen (ADVIL,MOTRIN) 200 MG tablet Take 200 mg by mouth every 6 (six) hours as needed.    [provider]  LYSINE PO Take by mouth daily.    [provider]  medroxyPROGESTERone (PROVERA) 10 MG tablet Take 1 tablet (10 mg total) by mouth daily for 10 days. 07/06/20 07/16/20  Nadara Mustard, MD  OMEPRAZOLE PO Take 40 mg by mouth daily. am    [provider]    Family History Family History  Problem Relation Age of Onset  . Breast cancer Mother   . Hypertension Father   .  Pancreatic cancer Maternal Grandmother     Social History Social History   Tobacco Use  . Smoking status: Former Smoker    Packs/day: 0.25    Types: Cigarettes    Quit date: 01/2018    Years since quitting: 3.1  . Smokeless tobacco: Never Used  Vaping Use  . Vaping Use: Every day  Substance Use Topics  . Alcohol use: Yes    Alcohol/week: 3.0 standard drinks    Types: 3 Standard drinks or equivalent per week    Comment: occ  . Drug use: No     Allergies   Penicillins   Review of Systems Review of Systems   Physical Exam Triage Vital Signs ED Triage Vitals  Enc Vitals Group     BP 04/05/21 1628 118/83     Pulse Rate 04/05/21 1628 100     Resp 04/05/21 1628 18     Temp 04/05/21 1628 97.8 F (36.6 C)     Temp src --      SpO2 04/05/21 1628 100 %     Weight --      Height --      Head Circumference --      Peak Flow --      Pain Score 04/05/21 1459 8     Pain Loc --      Pain Edu? --      Excl. in GC? --    No data found.  Updated Vital Signs BP 118/83   Pulse 100   Temp 97.8 F (36.6 C)   Resp 18   LMP 03/26/2021   SpO2 100%   Physical Exam Constitutional:      General: She is not in acute distress.    Appearance: She is well-developed.  HENT:     Mouth/Throat:     Mouth: Mucous membranes are moist.     Pharynx: Uvula midline. Posterior oropharyngeal erythema (very mild redness to posterior oropharynx ) present. No pharyngeal swelling, oropharyngeal exudate or uvula swelling.     Tonsils: 0 on the right. 0 on the left.  Cardiovascular:     Rate and Rhythm: Normal rate.  Pulmonary:     Effort: Pulmonary effort is normal.     Breath sounds: Normal breath sounds.     Comments: No work of breathing on exam; no cough throughout exam  Skin:    General: Skin is warm and dry.  Neurological:     Mental Status: She is alert  and oriented to person, place, and time.      UC Treatments / Results  Labs (all labs ordered are listed, but only  abnormal results are displayed) Labs Reviewed  SARS CORONAVIRUS 2 (TAT 6-24 HRS)    EKG   Radiology DG Chest 2 View  Result Date: 04/05/2021 CLINICAL DATA:  Cough for 1 month, short of breath, wheezing EXAM: CHEST - 2 VIEW COMPARISON:  09/08/2017 FINDINGS: The heart size and mediastinal contours are within normal limits. Both lungs are clear. The visualized skeletal structures are unremarkable. IMPRESSION: No active cardiopulmonary disease. Electronically Signed   By: Sharlet Salina M.D.   On: 04/05/2021 17:26    Procedures Procedures (including critical care time)  Medications Ordered in UC Medications - No data to display  Initial Impression / Assessment and Plan / UC Course  I have reviewed the triage vital signs and the nursing notes.  Pertinent labs & imaging results that were available during my care of the patient were reviewed by me and considered in my medical decision making (see chart for details).     cxray unremarkable. Afebrile here today. Hr of 100. No work of breathing. Mild throat redness without any swelling petechia or exudate. Opted to provide empiric coverage with azithromycin. Supportive cares recommended. Return precautions provided. Patient verbalized understanding and agreeable to plan.   Final Clinical Impressions(s) / UC Diagnoses   Final diagnoses:  Cough  Sore throat     Discharge Instructions     Your chest xray looks well today which is reassuring.  I have still sent antibiotics which should treat any bacterial infection if present.  Throat lozenges, gargles, chloraseptic spray, warm teas, popsicles etc to help with throat pain.   5 days of prednisone to help with your cough symptoms.  Over the counter medications as needed for symptoms.  If symptoms worsen or do not improve in the next week to return to be seen or to follow up with your PCP.      ED Prescriptions    Medication Sig Dispense Auth. Provider   azithromycin (ZITHROMAX) 250  MG tablet Take 2 tablets (500 mg total) by mouth daily for 1 day, THEN 1 tablet (250 mg total) daily for 4 days. 6 tablet Linus Mako B, NP   predniSONE (DELTASONE) 20 MG tablet Take 2 tablets (40 mg total) by mouth daily with breakfast for 5 days. 10 tablet Georgetta Haber, NP     PDMP not reviewed this encounter.   Georgetta Haber, NP 04/05/21 1801

## 2021-04-05 NOTE — ED Triage Notes (Signed)
Pt presents with cough xs 1 month, swollen lymph nodes and sore throat.

## 2021-04-06 ENCOUNTER — Telehealth: Payer: Self-pay

## 2021-04-06 NOTE — Telephone Encounter (Signed)
Called to discuss with patient about COVID-19 symptoms and the use of one of the available treatments for those with mild to moderate Covid symptoms and at a high risk of hospitalization.  Pt appears to qualify for outpatient treatment due to co-morbid conditions and/or a member of an at-risk group in accordance with the FDA Emergency Use Authorization.    Symptom onset: ? Vaccinated: Unknown Booster? Unknown Immunocompromised?  Qualifiers:   Unable to reach pt - Left message and call back number [phone].   Esther Hardy

## 2021-07-06 ENCOUNTER — Other Ambulatory Visit: Payer: Self-pay | Admitting: Otolaryngology

## 2021-07-06 DIAGNOSIS — E0590 Thyrotoxicosis, unspecified without thyrotoxic crisis or storm: Secondary | ICD-10-CM

## 2021-07-22 DIAGNOSIS — N189 Chronic kidney disease, unspecified: Secondary | ICD-10-CM | POA: Diagnosis not present

## 2021-07-22 DIAGNOSIS — Z87891 Personal history of nicotine dependence: Secondary | ICD-10-CM | POA: Insufficient documentation

## 2021-07-22 DIAGNOSIS — K852 Alcohol induced acute pancreatitis without necrosis or infection: Secondary | ICD-10-CM | POA: Insufficient documentation

## 2021-07-22 DIAGNOSIS — Z79899 Other long term (current) drug therapy: Secondary | ICD-10-CM | POA: Diagnosis not present

## 2021-07-22 DIAGNOSIS — R109 Unspecified abdominal pain: Secondary | ICD-10-CM | POA: Diagnosis present

## 2021-07-23 ENCOUNTER — Emergency Department: Payer: Medicaid Other

## 2021-07-23 ENCOUNTER — Other Ambulatory Visit: Payer: Self-pay

## 2021-07-23 ENCOUNTER — Emergency Department
Admission: EM | Admit: 2021-07-23 | Discharge: 2021-07-23 | Disposition: A | Discharge status: Home / Self Care | Payer: Medicaid Other | Attending: Emergency Medicine | Admitting: Emergency Medicine

## 2021-07-23 ENCOUNTER — Encounter: Payer: Self-pay | Admitting: Emergency Medicine

## 2021-07-23 DIAGNOSIS — K852 Alcohol induced acute pancreatitis without necrosis or infection: Secondary | ICD-10-CM

## 2021-07-23 DIAGNOSIS — R1013 Epigastric pain: Secondary | ICD-10-CM

## 2021-07-23 LAB — HEPATIC FUNCTION PANEL
ALT: 27 U/L (ref 0–44)
AST: 37 U/L (ref 15–41)
Albumin: 3.9 g/dL (ref 3.5–5.0)
Alkaline Phosphatase: 45 U/L (ref 38–126)
Bilirubin, Direct: <0.1 mg/dL (ref 0.0–0.2)
Total Bilirubin: 1 mg/dL (ref 0.3–1.2)
Total Protein: 7.3 g/dL (ref 6.5–8.1)

## 2021-07-23 LAB — CBC
HCT: 40.6 % (ref 36.0–46.0)
Hemoglobin: 14.9 g/dL (ref 12.0–15.0)
MCH: 36.8 pg — ABNORMAL HIGH (ref 26.0–34.0)
MCHC: 36.7 g/dL — ABNORMAL HIGH (ref 30.0–36.0)
MCV: 100.2 fL — ABNORMAL HIGH (ref 80.0–100.0)
Platelets: 345 10*3/uL (ref 150–400)
RBC: 4.05 MIL/uL (ref 3.87–5.11)
RDW: 11.7 % (ref 11.5–15.5)
WBC: 7.4 10*3/uL (ref 4.0–10.5)
nRBC: 0 % (ref 0.0–0.2)

## 2021-07-23 LAB — BASIC METABOLIC PANEL
Anion gap: 10 (ref 5–15)
BUN: 20 mg/dL (ref 6–20)
CO2: 24 mmol/L (ref 22–32)
Calcium: 9 mg/dL (ref 8.9–10.3)
Chloride: 105 mmol/L (ref 98–111)
Creatinine, Ser: 0.75 mg/dL (ref 0.44–1.00)
GFR, Estimated: >60 mL/min (ref >60)
Glucose, Bld: 115 mg/dL — ABNORMAL HIGH (ref 70–99)
Potassium: 3.6 mmol/L (ref 3.5–5.1)
Sodium: 139 mmol/L (ref 135–145)

## 2021-07-23 LAB — TROPONIN I (HIGH SENSITIVITY)
Troponin I (High Sensitivity): 3 ng/L (ref <18)
Troponin I (High Sensitivity): <2 ng/L (ref <18)

## 2021-07-23 LAB — LIPASE, BLOOD: Lipase: 80 U/L — ABNORMAL HIGH (ref 11–51)

## 2021-07-23 MED ORDER — FAMOTIDINE IN NACL 20-0.9 MG/50ML-% IV SOLN
20.0000 mg | Freq: Once | INTRAVENOUS | Status: AC
  Administered 2021-07-23: 20 mg via INTRAVENOUS
  Filled 2021-07-23: qty 50

## 2021-07-23 MED ORDER — SODIUM CHLORIDE 0.9 % IV BOLUS
1000.0000 mL | Freq: Once | INTRAVENOUS | Status: AC
  Administered 2021-07-23: 1000 mL via INTRAVENOUS

## 2021-07-23 MED ORDER — ONDANSETRON 4 MG PO TBDP: 4.0000 mg | ORAL_TABLET | Freq: Three times a day (TID) | ORAL | 0 refills | Status: DC | PRN

## 2021-07-23 MED ORDER — ONDANSETRON HCL 4 MG/2ML IJ SOLN
4.0000 mg | Freq: Once | INTRAMUSCULAR | Status: AC
  Administered 2021-07-23: 4 mg via INTRAVENOUS
  Filled 2021-07-23: qty 2

## 2021-07-23 MED ORDER — OXYCODONE-ACETAMINOPHEN 5-325 MG PO TABS: 1.0000 | ORAL_TABLET | ORAL | 0 refills | Status: DC | PRN

## 2021-07-23 MED ORDER — FENTANYL CITRATE (PF) 100 MCG/2ML IJ SOLN
50.0000 ug | Freq: Once | INTRAMUSCULAR | Status: AC
  Administered 2021-07-23: 50 ug via INTRAVENOUS
  Filled 2021-07-23: qty 2

## 2021-07-23 NOTE — ED Triage Notes (Signed)
Pt arrived via POV with reports of shortness of breath, cough, chest pain pt states sxs began 1 1/2 weeks ago, pt states she was seen at urgent care and pcp. Pt has been on prednisone, atbx, cough syrup and tessalon perles. ALso using proair with no relief of sxs.   PT able to speak in complete sentences. Pt c/o epigastric pain that is tender to the touch.

## 2021-07-23 NOTE — ED Notes (Signed)
Red and blue top tube sent to lab with save label

## 2021-07-23 NOTE — Discharge Instructions (Addendum)
1. You may take pain and nausea medicines as needed (Percocet/Zofran #15).    2.  Eat a bland diet for the next week.    3.  Return to the ER for worsening symptoms, persistent vomiting, difficulty    breathing or other concerns.

## 2021-07-23 NOTE — ED Provider Notes (Signed)
[facility] Emergency Department Provider Note   ____________________________________________   Event Date/Time   First MD Initiated Contact with Patient 07/23/21 0129     (approximate)  I have reviewed the triage vital signs and the nursing notes.   HISTORY  Chief Complaint Cough and Shortness of Breath    HPI NEVEA SPIEWAK is a 30 y.o. female who presents to the ED from home with a chief complaint of abdominal pain, nausea and vomiting. Patient with symptoms of cough, sob, chest pain x 1.5 weeks. She was seen by her PCP and UC and has been on Prednisone, antibiotic, cough syrup, albuterol inhaler and tessalon perles. Complains of epigastric abdominal pain, nausea and vomiting today. Denies fever, chest pain, shortness of breath, dysuria or diarrhea.     Past Medical History:  Diagnosis Date   ADHD    Anemia    Anxiety    Chronic kidney disease    kidney reflux, problem more as child/ recuurent interstitial cystitis   Family history of breast cancer    7/21 cancer genetic testing letter sent   GERD (gastroesophageal reflux disease)    Headache    daily   IC (interstitial cystitis)    Infection    UTI   Thyroid dysfunction    Tonsillitis     Patient Active Problem List   Diagnosis Date Noted   Other somatoform disorders    Gastroesophageal reflux disease without esophagitis    Acute gastritis without hemorrhage    Generalized anxiety disorder 05/04/2016   Acute upper respiratory infection 05/04/2016   Low TSH level 05/04/2016   Low back pain 05/04/2016    Past Surgical History:  Procedure Laterality Date   BREAST SURGERY     augmentation   CESAREAN SECTION     ESOPHAGOGASTRODUODENOSCOPY (EGD) WITH PROPOFOL N/A 07/07/2018   Procedure: ESOPHAGOGASTRODUODENOSCOPY (EGD) WITH PROPOFOL;  Surgeon: Midge Minium, MD;  Location: [facility];  Service: Endoscopy;  Laterality: N/A;   TONSILLECTOMY N/A 01/29/2018   Procedure:  TONSILLECTOMY;  Surgeon: Bud Face, MD;  Location: [facility];  Service: ENT;  Laterality: N/A;  UPREG    Prior to Admission medications   Medication Sig Start Date End Date Taking? Authorizing Provider  ondansetron (ZOFRAN ODT) 4 MG disintegrating tablet Take 1 tablet (4 mg total) by mouth every 8 (eight) hours as needed for nausea or vomiting. 07/23/21  Yes Irean Hong, MD  oxyCODONE-acetaminophen (PERCOCET/ROXICET) 5-325 MG tablet Take 1 tablet by mouth every 4 (four) hours as needed for severe pain. 07/23/21  Yes Irean Hong, MD  ALPRAZolam Prudy Feeler) 0.5 MG tablet Take 2 mg by mouth 3 (three) times daily as needed for anxiety.     [provider]  amphetamine-dextroamphetamine (ADDERALL) 15 MG tablet Take 30 mg by mouth 3 (three) times daily. Only takes while in school    [provider]  Ascorbic Acid (VITAMIN C PO) Take by mouth daily.    [provider]  cetirizine (ZYRTEC) 10 MG tablet Take 10 mg by mouth daily.    [provider]  Cholecalciferol (VITAMIN D3 PO) Take by mouth daily.    [provider]  dexlansoprazole (DEXILANT) 60 MG capsule Take 1 capsule (60 mg total) by mouth daily. 04/04/20   Midge Minium, MD  flavoxATE (URISPAS) 100 MG tablet Take 1 tablet (100 mg total) by mouth 3 (three) times daily as needed for bladder spasms (bladder spasm). 07/19/20   Nadara Mustard, MD  ibuprofen (ADVIL,MOTRIN) 200 MG tablet Take 200 mg by mouth every 6 (six) hours as needed.    [provider]  LYSINE PO Take by mouth daily.    [provider]  medroxyPROGESTERone (PROVERA) 10 MG tablet Take 1 tablet (10 mg total) by mouth daily for 10 days. 07/06/20 07/16/20  Nadara MustardHarris, Robert P, MD  OMEPRAZOLE PO Take 40 mg by mouth daily. am    [provider]    Allergies Cephalexin and Penicillins  Family History  Problem Relation Age of Onset   Breast cancer Mother    Hypertension Father    Pancreatic cancer  Maternal Grandmother     Social History Social History   Tobacco Use   Smoking status: Former    Packs/day: 0.25    Types: Cigarettes    Quit date: 01/2018    Years since quitting: 3.4   Smokeless tobacco: Never  Vaping Use   Vaping Use: Every day  Substance Use Topics   Alcohol use: Yes    Alcohol/week: 3.0 standard drinks    Types: 3 Standard drinks or equivalent per week    Comment: occ   Drug use: No    Review of Systems  Constitutional: No fever/chills Eyes: No visual changes. ENT: No sore throat. Cardiovascular: Denies chest pain. Respiratory: Denies shortness of breath. Gastrointestinal: Positive for abdominal pain, nausea and vomiting.  No diarrhea.  No constipation. Genitourinary: Negative for dysuria. Musculoskeletal: Negative for back pain. Skin: Negative for rash. Neurological: Negative for headaches, focal weakness or numbness.   ____________________________________________   PHYSICAL EXAM:  VITAL SIGNS: ED Triage Vitals [07/23/21 0007]  Enc Vitals Group     BP 110/77     Pulse Rate (!) 106     Resp 18     Temp 98.6 F (37 C)     Temp Source Oral     SpO2 99 %     Weight      Height      Head Circumference      Peak Flow      Pain Score 8     Pain Loc      Pain Edu?      Excl. in GC?     Constitutional: Alert and oriented. Well appearing and in mild acute distress. Eyes: Conjunctivae are normal. PERRL. EOMI. Head: Atraumatic. Nose: No congestion/rhinnorhea. Mouth/Throat: Mucous membranes are moist.   Neck: No stridor.   Cardiovascular: Normal rate, regular rhythm. Grossly normal heart sounds.  Good peripheral circulation. Respiratory: Normal respiratory effort.  No retractions. Lungs CTAB. Gastrointestinal: Soft and mildly tender to palpation epigastrium without rebound or guarding. No distention. No abdominal bruits. No CVA tenderness. Musculoskeletal: No lower extremity tenderness nor edema.  No joint effusions. Neurologic:   Normal speech and language. No gross focal neurologic deficits are appreciated.  Skin:  Skin is warm, dry and intact. No rash noted. Psychiatric: Mood and affect are normal. Speech and behavior are normal.  ____________________________________________   LABS (all labs ordered are listed, but only abnormal results are displayed)  Labs Reviewed  BASIC METABOLIC PANEL - Abnormal; Notable for the following components:      Result Value   Glucose, Bld 115 (*)    All other components within normal limits  CBC - Abnormal; Notable for the following components:   MCV 100.2 (*)    MCH 36.8 (*)    MCHC 36.7 (*)    All other components within normal limits  LIPASE, BLOOD - Abnormal; Notable  for the following components:   Lipase 80 (*)    All other components within normal limits  HEPATIC FUNCTION PANEL  POC URINE PREG, ED  TROPONIN I (HIGH SENSITIVITY)  TROPONIN I (HIGH SENSITIVITY)   ____________________________________________  EKG  ED ECG REPORT I, Joetta Delprado J, the attending physician, personally viewed and interpreted this ECG.   Date: 07/23/2021  EKG Time: 0007  Rate: 110  Rhythm: sinus tachycardia  Axis: Normal  Intervals:none  ST&T Change: Nonspecific  ____________________________________________  RADIOLOGY I, Corianne Buccellato J, personally viewed and evaluated these images (plain radiographs) as part of my medical decision making, as well as reviewing the written report by the radiologist.  ED MD interpretation:  No acute cardiopulmonary process; fatty steatosis  Official radiology report(s): DG Chest 2 View  Result Date: 07/23/2021 CLINICAL DATA:  cough, chest pain EXAM: CHEST - 2 VIEW COMPARISON:  Chest x-ray 04/05/2021, CT chest 11/18/2012, chest x-ray 9 6 FINDINGS: The heart size and mediastinal contours are within normal limits. Slightly more conspicuous ossific density on lateral view along the costophrenic angles posteriorly likely related to overlapping osseous  structures. No focal consolidation. No pulmonary edema. No pleural effusion. No pneumothorax. No acute osseous abnormality. IMPRESSION: No active cardiopulmonary disease. Electronically Signed   By: Tish Frederickson M.D.   On: 07/23/2021 00:31   US ABDOMEN LIMITED RUQ (LIVER/GB)  Result Date: 07/23/2021 CLINICAL DATA:  Epigastric pain. Right upper quadrant pain X 2 weeks. EXAM: ULTRASOUND ABDOMEN LIMITED RIGHT UPPER QUADRANT COMPARISON:  None. FINDINGS: Gallbladder: No gallstones or wall thickening visualized. No sonographic Murphy sign noted by sonographer. Common bile duct: Diameter: 3 mm. Liver: No focal lesion identified. Possible mild increased parenchymal echogenicity. Portal vein is patent on color Doppler imaging with normal direction of blood flow towards the liver. Other: None. IMPRESSION: Possible mild hepatic steatosis. Electronically Signed   By: Tish Frederickson M.D.   On: 07/23/2021 03:26    ____________________________________________   PROCEDURES  Procedure(s) performed (including Critical Care):  Procedures   ____________________________________________   INITIAL IMPRESSION / ASSESSMENT AND PLAN / ED COURSE  As part of my medical decision making, I reviewed the following data within the electronic MEDICAL RECORD NUMBER Nursing notes reviewed and incorporated, Labs reviewed, EKG interpreted, Old chart reviewed, Radiograph reviewed, and Notes from prior ED visits     30 year old female presenting with epigastric abdominal pain, nausea and vomiting. Differential diagnosis includes, but is not limited to, biliary disease (biliary colic, acute cholecystitis, cholangitis, choledocholithiasis, etc), intrathoracic causes for epigastric abdominal pain including ACS, gastritis, duodenitis, pancreatitis, small bowel or large bowel obstruction, abdominal aortic aneurysm, hernia, and ulcer(s).   Lipase mildly elevated.  Will check LFTs, right upper quadrant abdominal ultrasound.  Initiate IV  fluid hydration, IV fentanyl for pain, IV Zofran for nausea, IV Pepcid.  Will reassess.  Clinical Course as of 07/23/21 0457  Wynelle Link Jul 23, 2021  [id number] Patient feeling better.  Admits to daily beer use.  Updated patient and family member on ultrasound result.  Will discharge home with prescription for Percocet, Zofran and patient will follow up with her doctor as scheduled.  Strict return precautions given.  Both verbalized understanding agree with plan of care. [JS]    Clinical Course User Index [JS] Irean Hong, MD     ____________________________________________   FINAL CLINICAL IMPRESSION(S) / ED DIAGNOSES  Final diagnoses:  Epigastric pain  Alcohol-induced acute pancreatitis without infection or necrosis     ED Discharge Orders  Ordered    oxyCODONE-acetaminophen (PERCOCET/ROXICET) 5-325 MG tablet  Every 4 hours PRN        07/23/21 0410    ondansetron (ZOFRAN ODT) 4 MG disintegrating tablet  Every 8 hours PRN        07/23/21 0410             Note:  This document was prepared using Dragon voice recognition software and may include unintentional dictation errors.    Irean Hong, MD 07/23/21 [phone]

## 2021-07-23 NOTE — ED Notes (Signed)
Patient aware that we need urine sample for testing, unable at this time. Pt given instruction on providing urine sample when able to do so.  Patient educated about not driving or performing other critical tasks (such as operating heavy machinery, caring for infant/toddler/child) due to sedative nature of narcotic medications received while in the ED.  Pt/caregiver verbalized understanding.  

## 2021-07-26 ENCOUNTER — Other Ambulatory Visit: Payer: Self-pay

## 2021-07-26 ENCOUNTER — Ambulatory Visit
Admission: RE | Admit: 2021-07-26 | Discharge: 2021-07-26 | Disposition: A | Discharge status: Home / Self Care | Payer: Medicaid Other | Source: Ambulatory Visit | Attending: Otolaryngology | Admitting: Otolaryngology

## 2021-07-26 DIAGNOSIS — E0590 Thyrotoxicosis, unspecified without thyrotoxic crisis or storm: Secondary | ICD-10-CM | POA: Diagnosis present

## 2021-07-27 ENCOUNTER — Other Ambulatory Visit: Payer: Self-pay | Admitting: Otolaryngology

## 2021-07-27 ENCOUNTER — Other Ambulatory Visit (HOSPITAL_COMMUNITY): Payer: Self-pay | Admitting: Otolaryngology

## 2021-07-27 DIAGNOSIS — E041 Nontoxic single thyroid nodule: Secondary | ICD-10-CM

## 2021-09-08 DIAGNOSIS — E041 Nontoxic single thyroid nodule: Secondary | ICD-10-CM | POA: Insufficient documentation

## 2021-09-11 IMAGING — CR DG CHEST 2V
1 series · 2 of 2 positions shown · non-contrast
Comparison: Chest x-ray 04/05/2021, CT chest 11/18/2012, chest
x-ray [DATE]

CLINICAL DATA: cough, chest pain

EXAM:
CHEST - 2 VIEW

[Series 1: dg chest 2 view · 0.14mm/px · 2 of 2 slices shown]
[im 1/2]
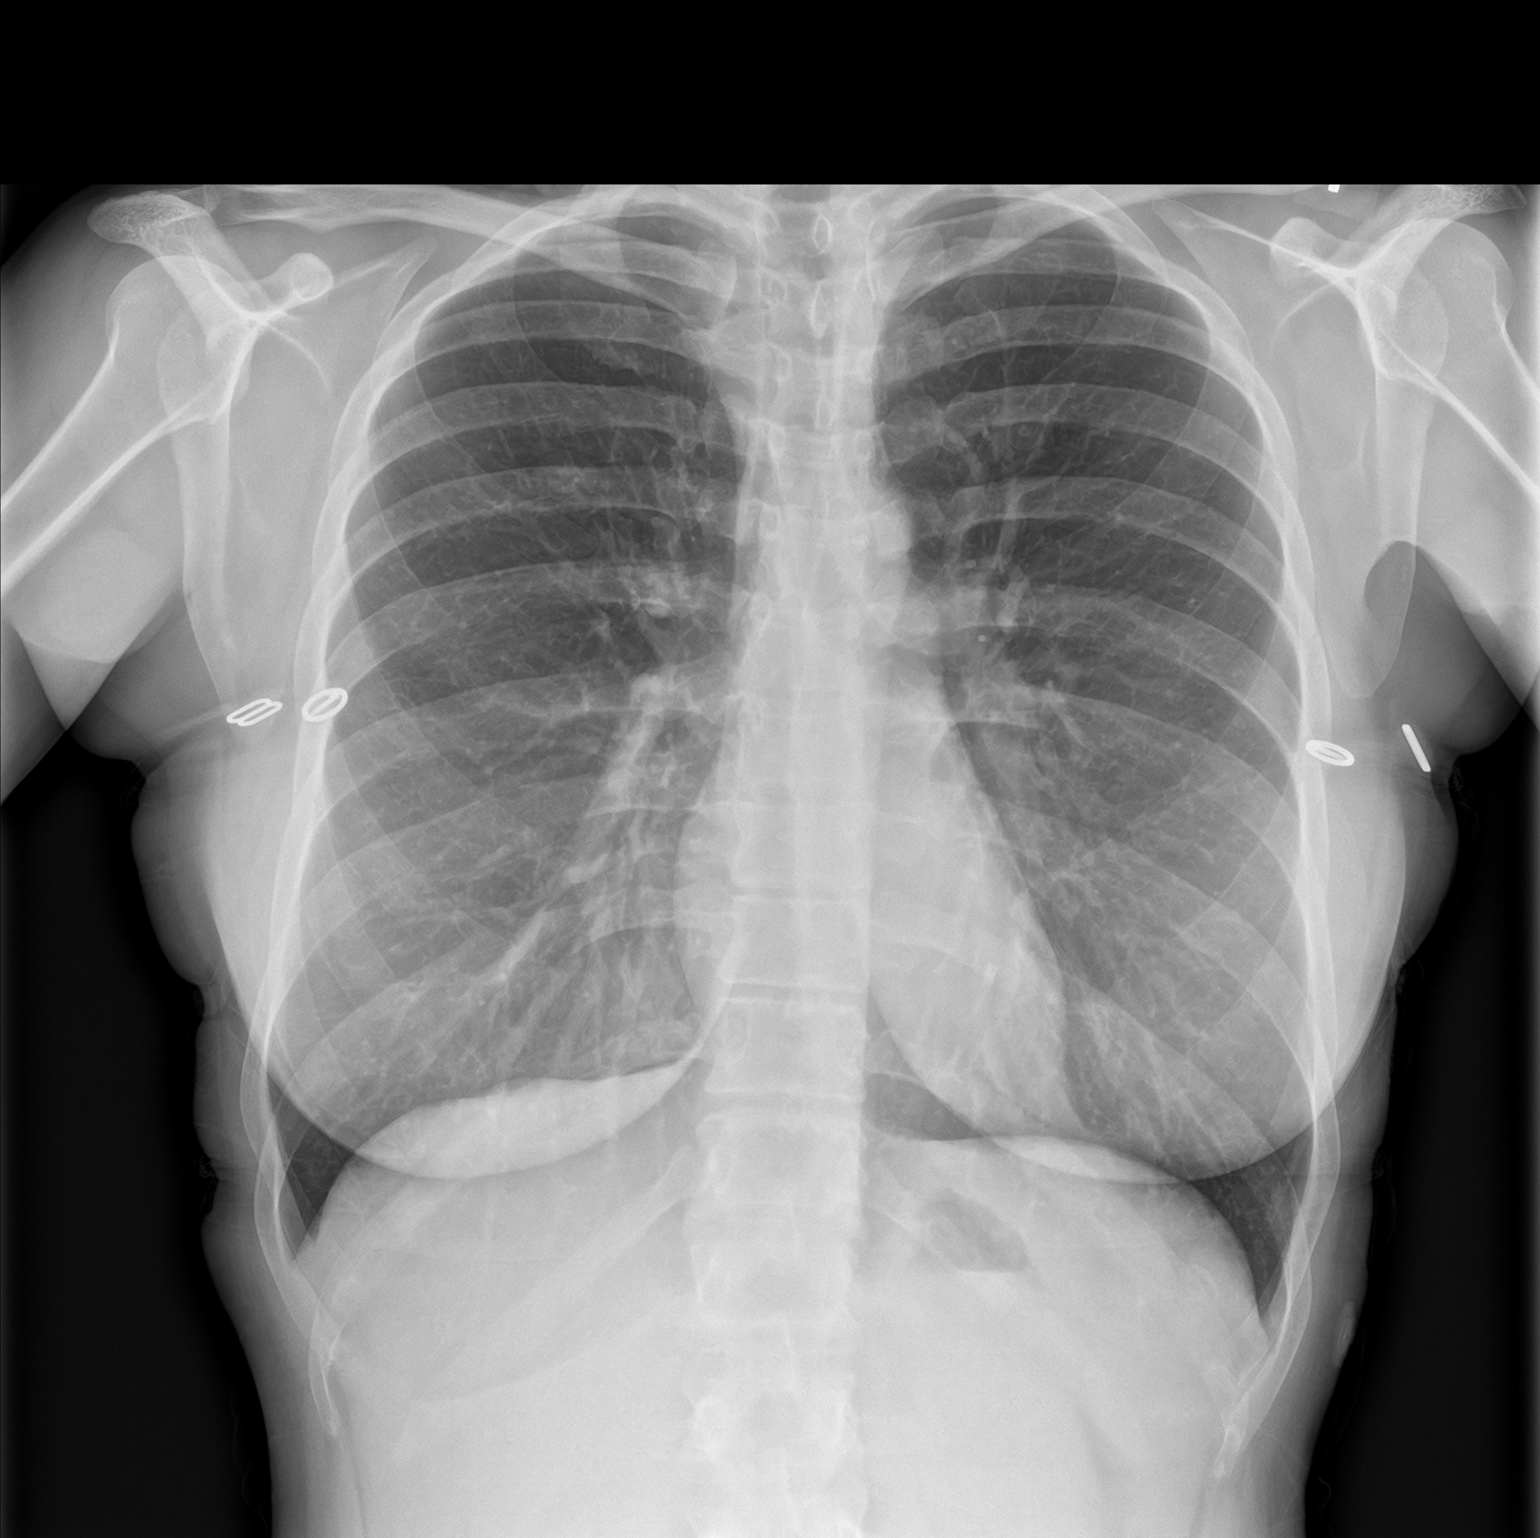
[im 2/2]
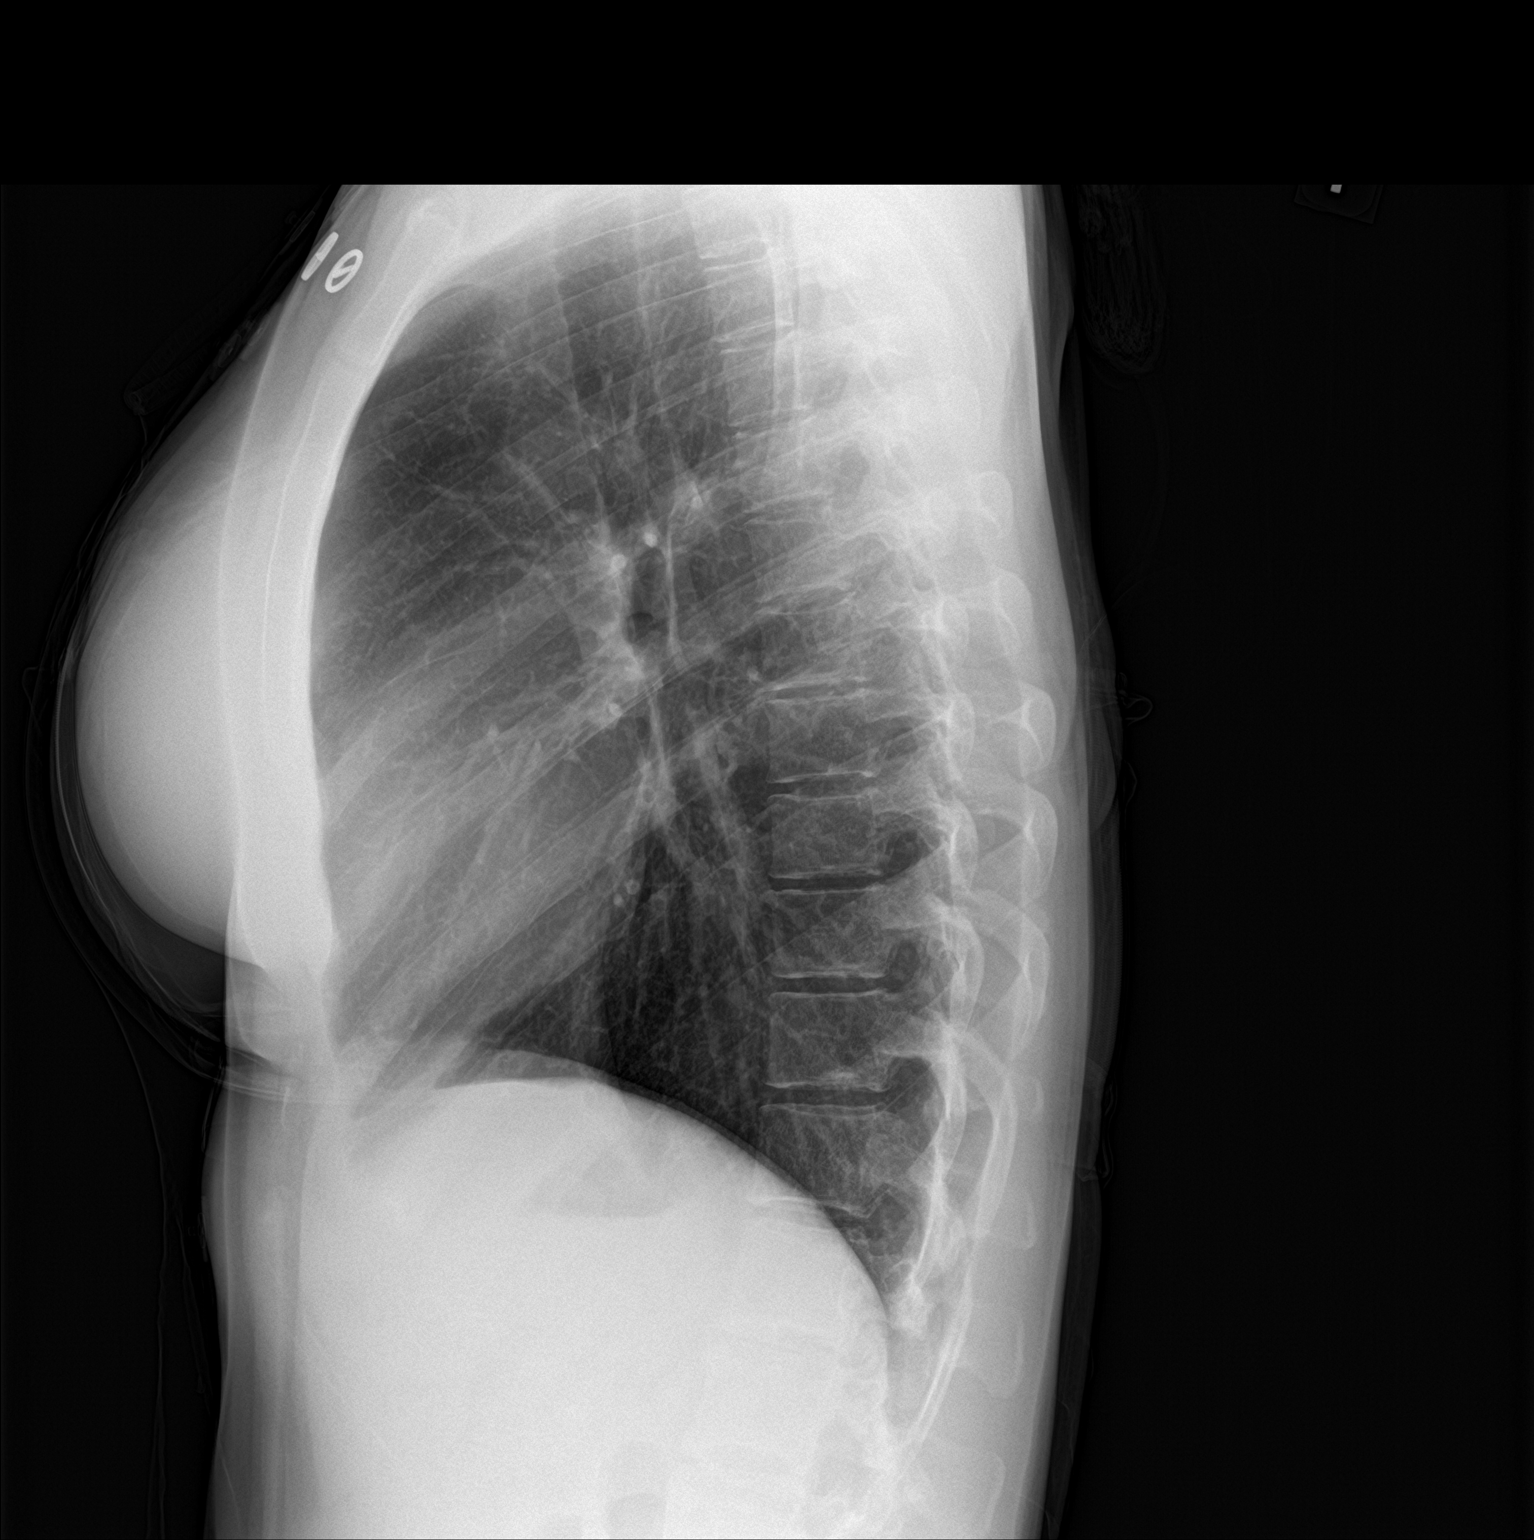

[2 of 2 positions shown; findings below may reference images not displayed]

FINDINGS: The heart size and mediastinal contours are within normal limits.

Slightly more conspicuous ossific density on lateral view along the
costophrenic angles posteriorly likely related to overlapping
osseous structures. No focal consolidation. No pulmonary edema. No
pleural effusion. No pneumothorax.

No acute osseous abnormality.
IMPRESSION: No active cardiopulmonary disease.

## 2021-09-11 IMAGING — US US ABDOMEN LIMITED
1 series · 14 of 25 positions shown · non-contrast
Comparison: None.

CLINICAL DATA: Epigastric pain. Right upper quadrant pain X 2
weeks.

EXAM:
ULTRASOUND ABDOMEN LIMITED RIGHT UPPER QUADRANT

[Series 1: us abdomen limited ruq (liver/gb) · 14 of 42 slices shown]
[im 1/42]
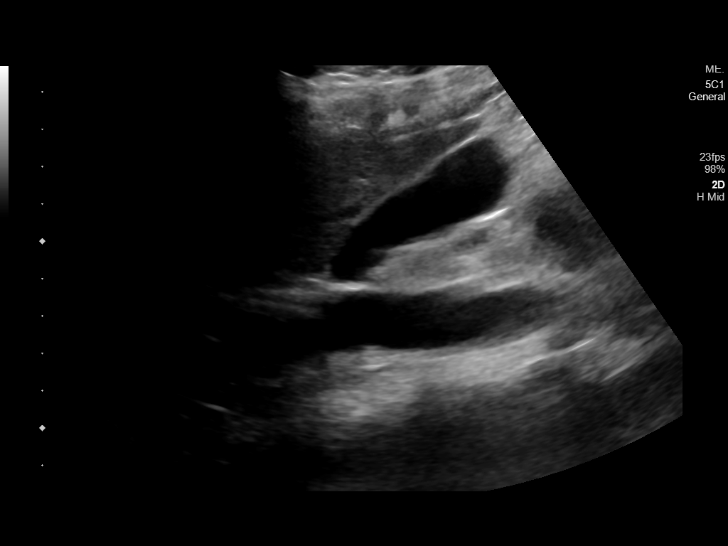
[im 4/42]
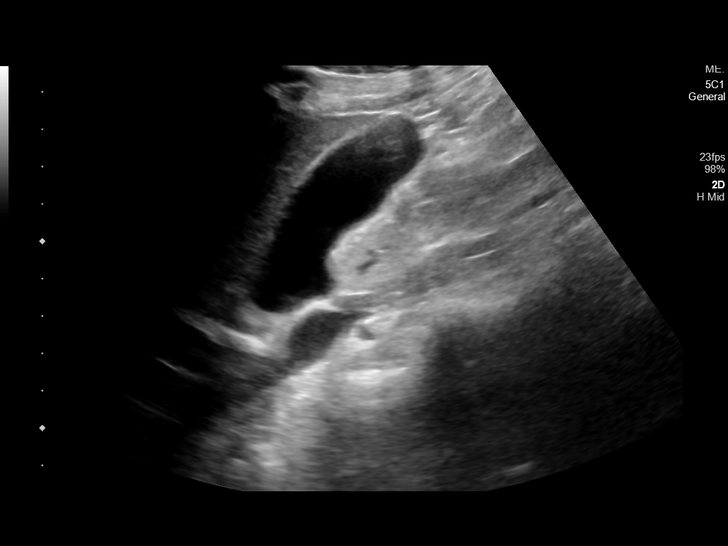
[im 7/42]
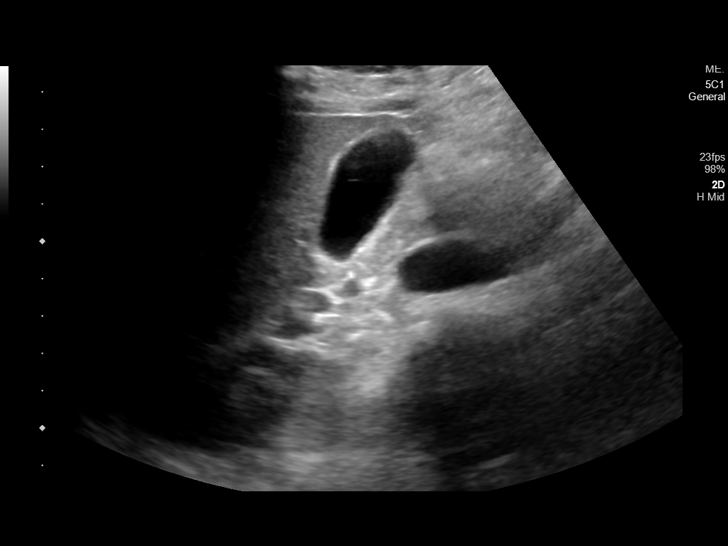
[im 11/42]
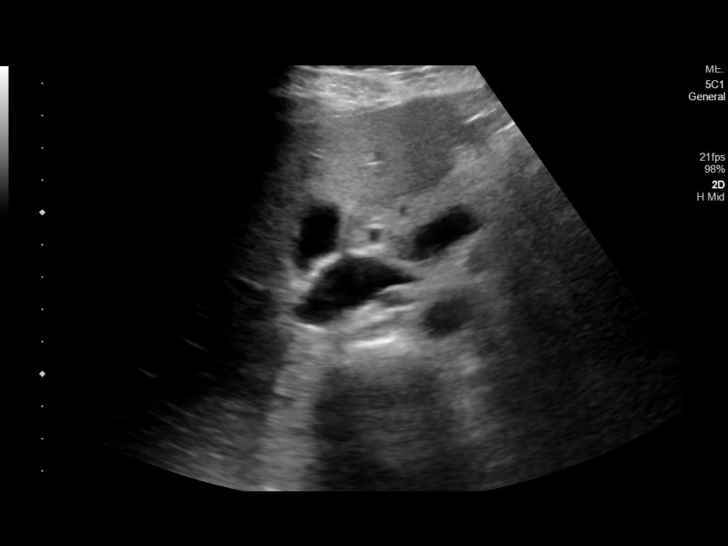
[im 14/42]
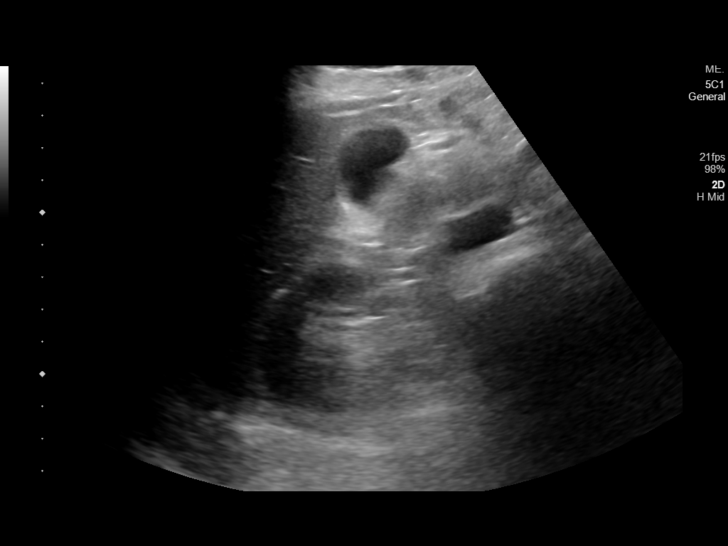
[im 16/42]
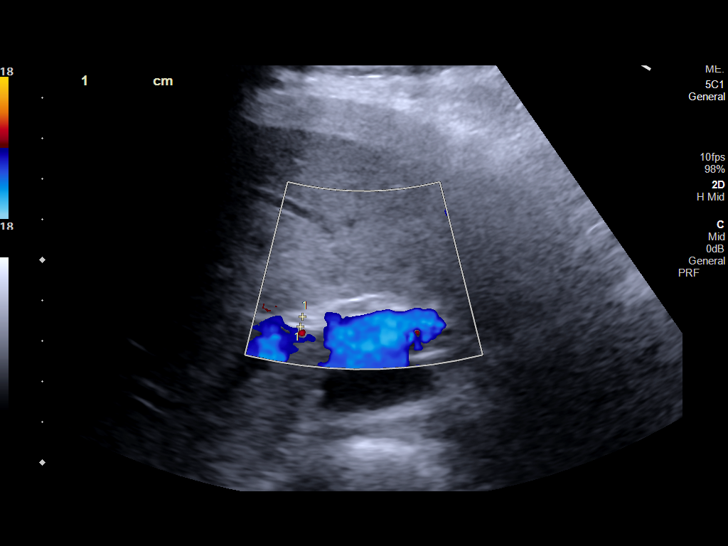
[im 19/42]
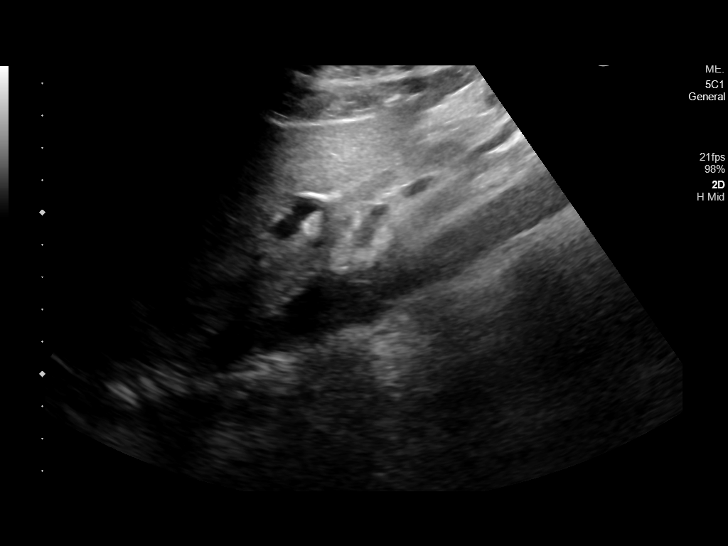
[im 23/42]
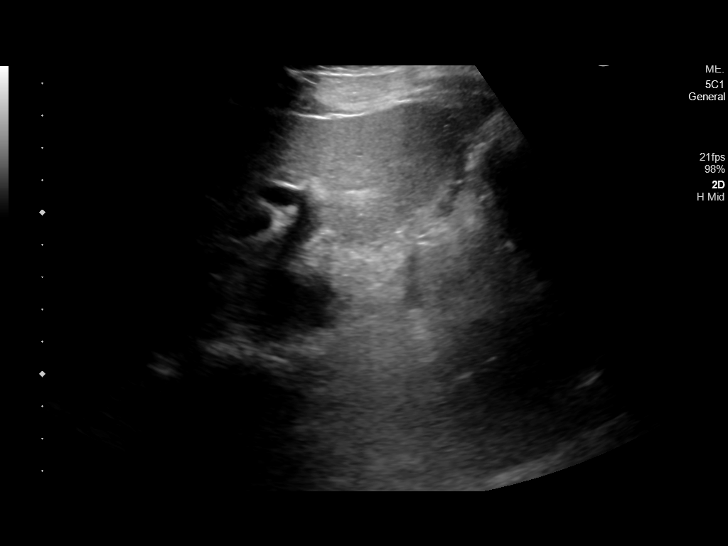
[im 26/42]
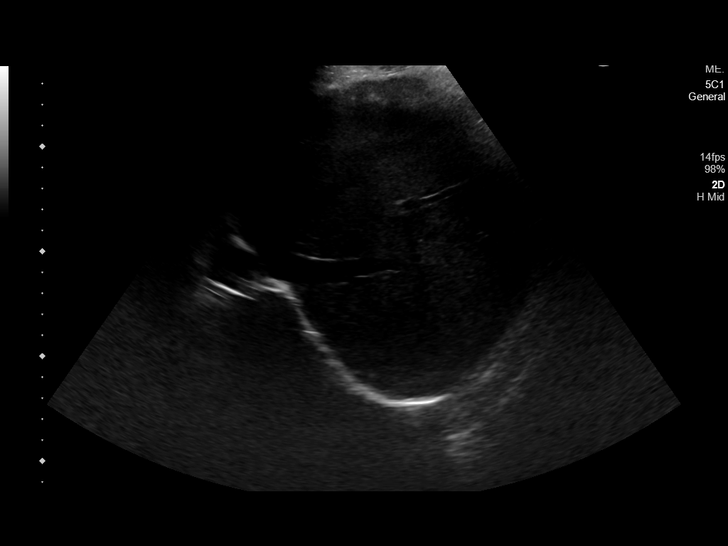
[im 28/42]
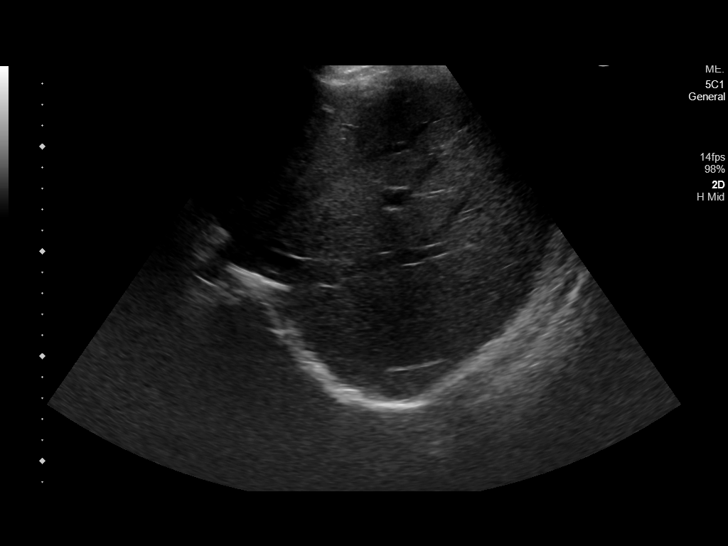
[im 31/42]
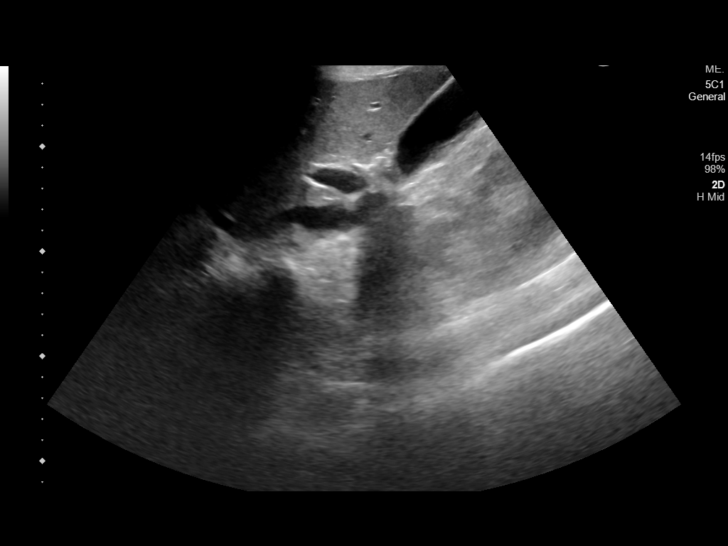
[im 35/42]
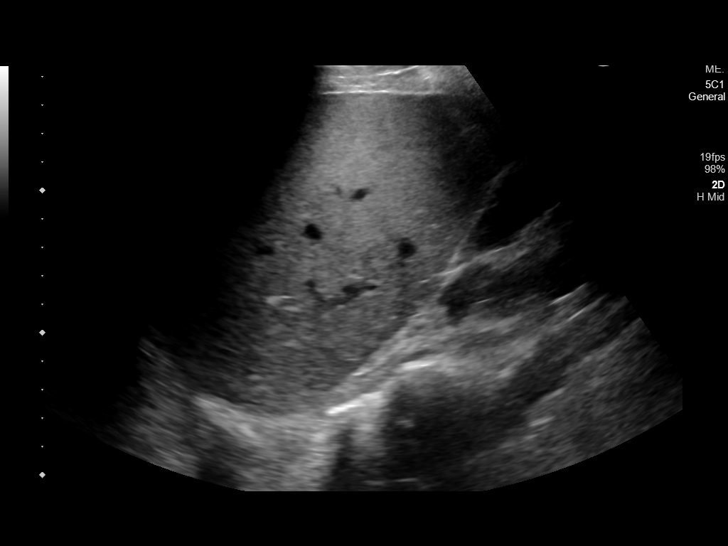
[im 38/42]
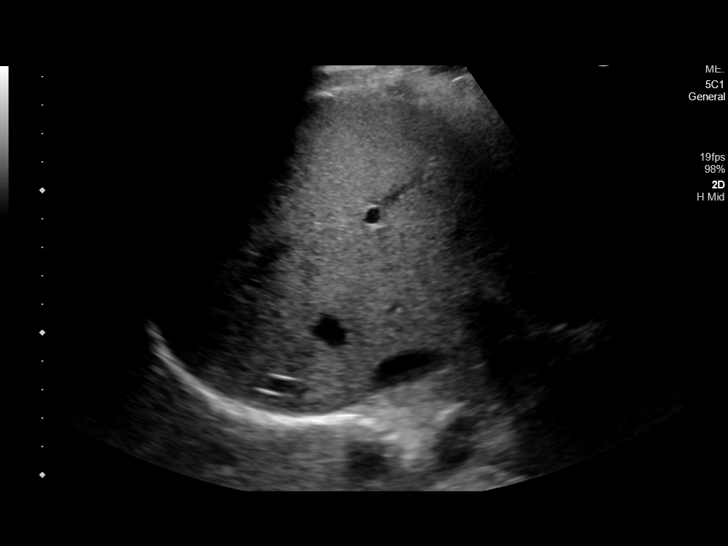
[im 42/42]
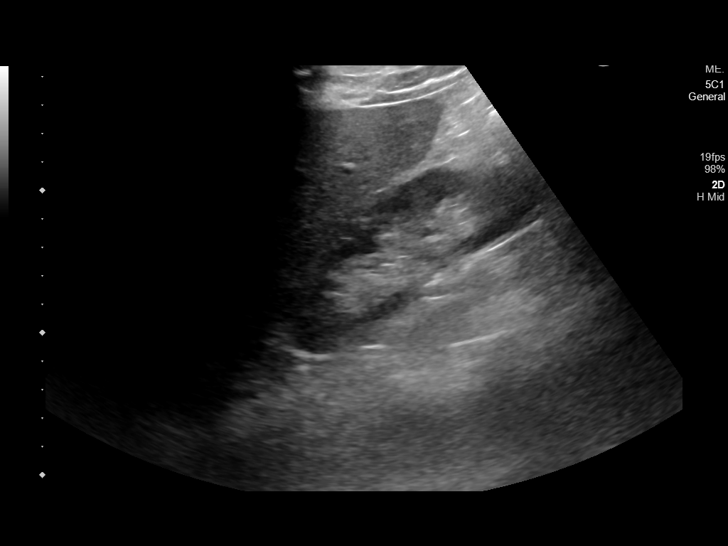

[14 of 25 positions shown; findings below may reference images not displayed]

FINDINGS: Gallbladder:

No gallstones or wall thickening visualized. No sonographic Murphy
sign noted by sonographer.

Common bile duct:

Diameter: 3 mm.

Liver:

No focal lesion identified. Possible mild increased parenchymal
echogenicity. Portal vein is patent on color Doppler imaging with
normal direction of blood flow towards the liver.

Other: None.
IMPRESSION: Possible mild hepatic steatosis.

## 2021-09-14 IMAGING — US US THYROID
1 series · 13 of 25 positions shown · non-contrast
Comparison: None.

CLINICAL DATA: Hyperthyroidism

EXAM:
THYROID ULTRASOUND
TECHNIQUE: Ultrasound examination of the thyroid gland and adjacent soft
tissues was performed.

[Series 1: us thyroid · 0.07mm/px · 13 of 44 slices shown]
[im 1/44]
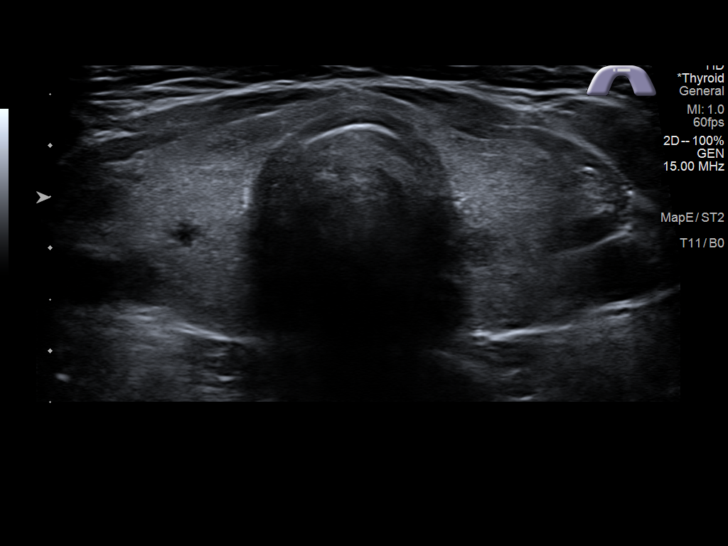
[im 4/44]
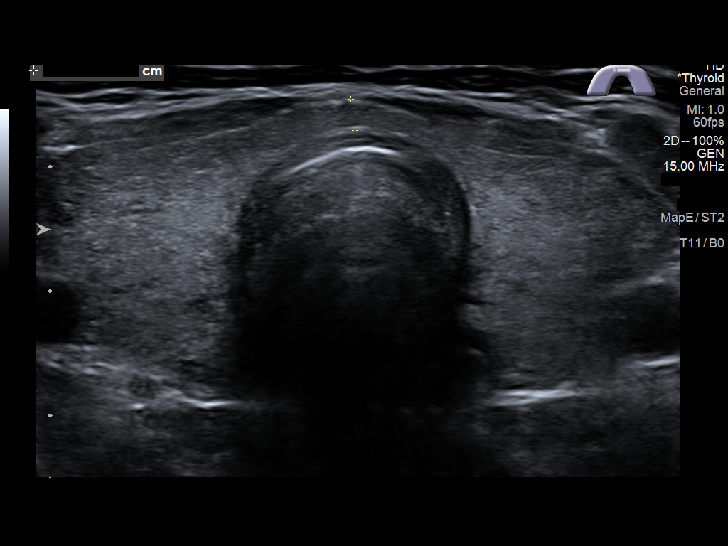
[im 8/44]
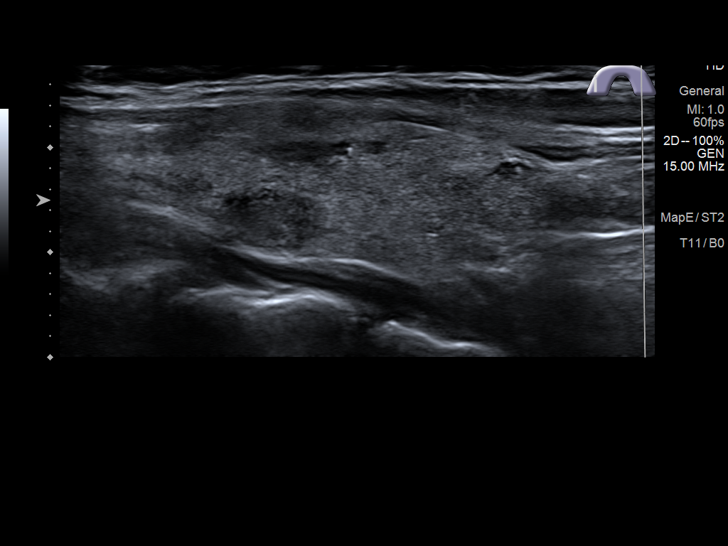
[im 11/44]
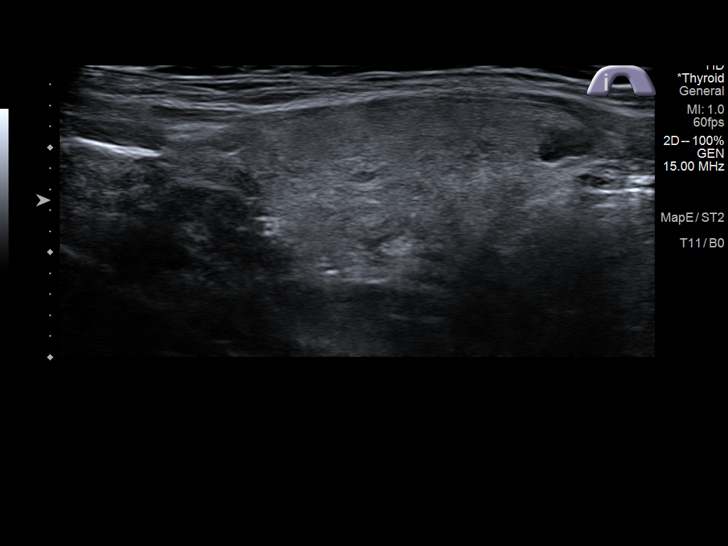
[im 15/44]
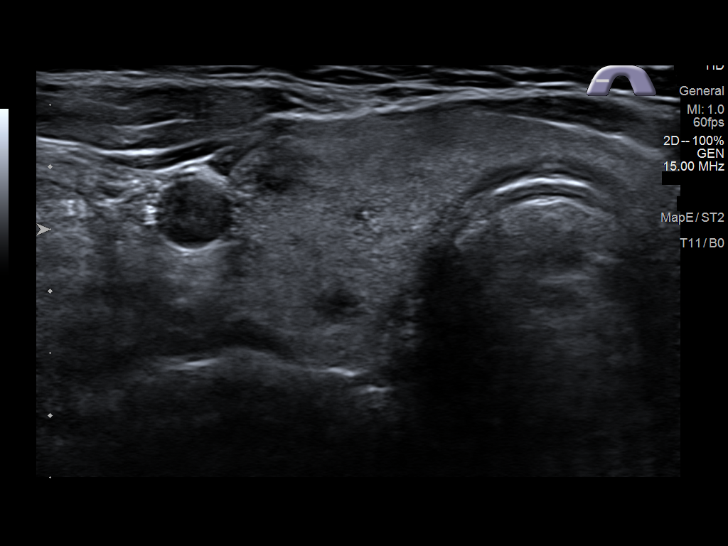
[im 18/44]
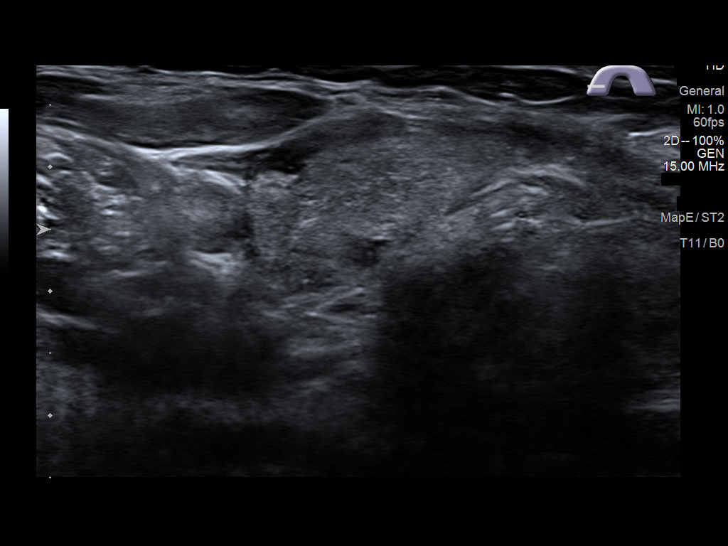
[im 22/44]
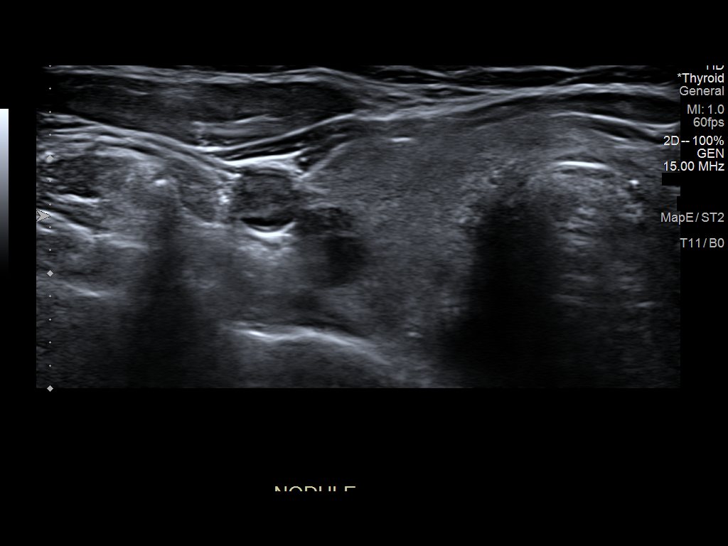
[im 26/44]
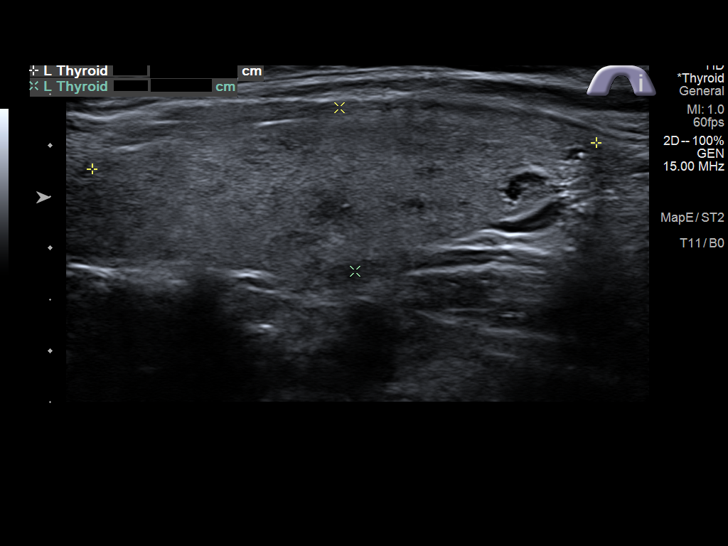
[im 29/44]
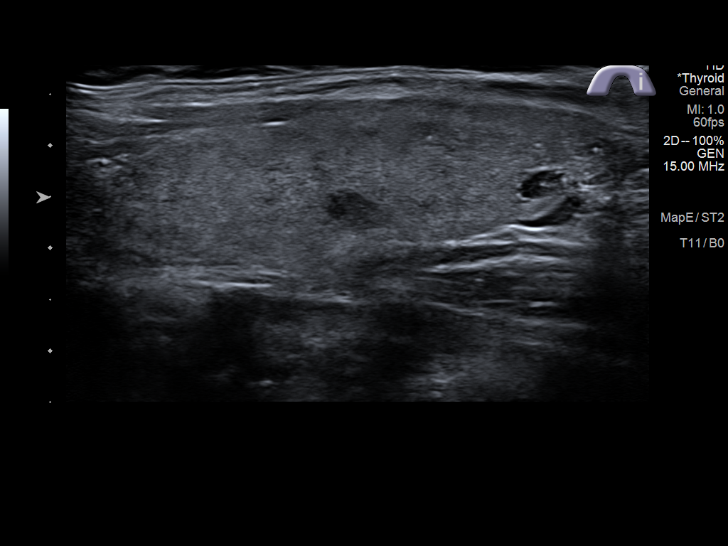
[im 33/44]
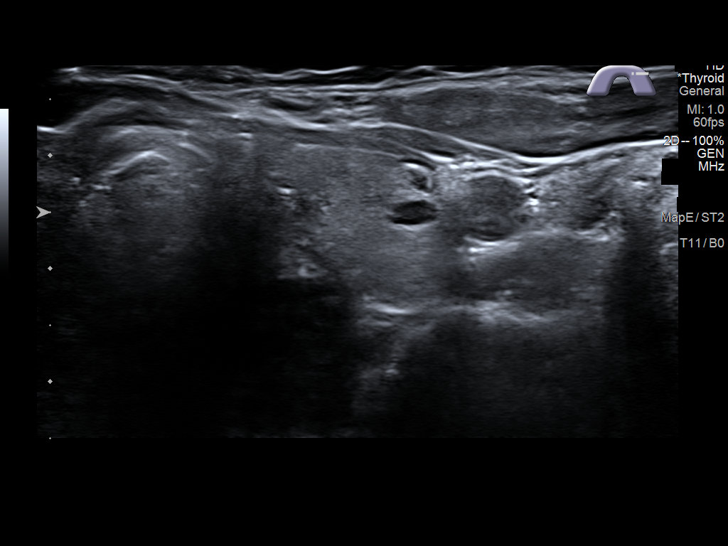
[im 36/44]
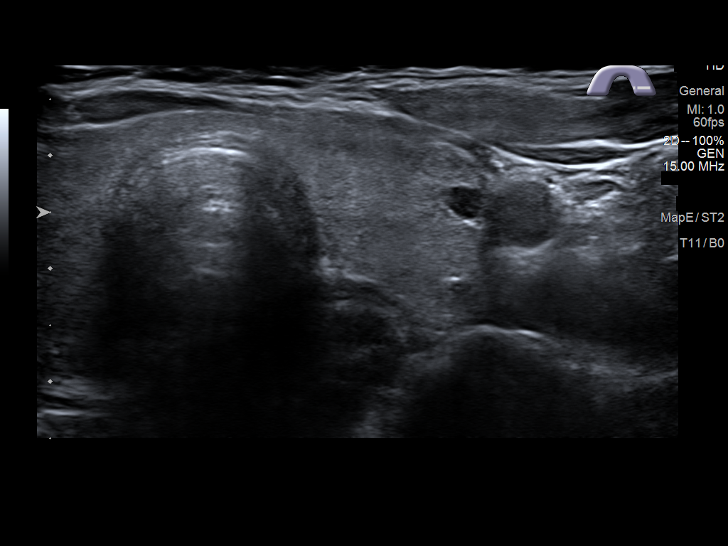
[im 40/44]
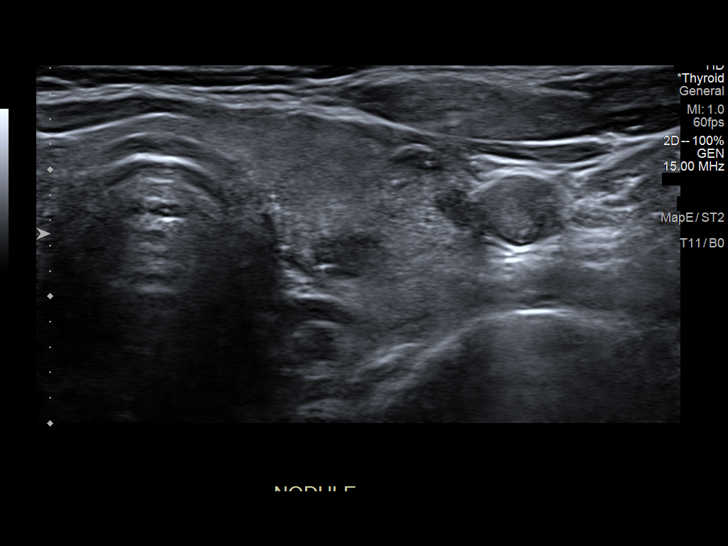
[im 44/44]
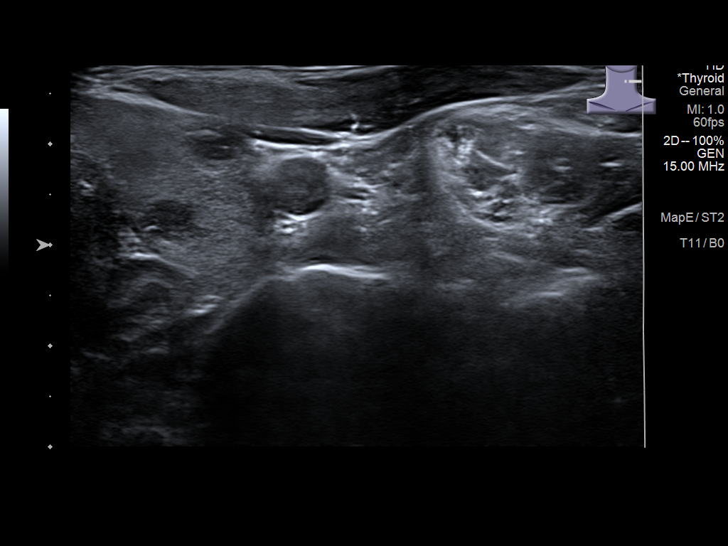

[13 of 25 positions shown; findings below may reference images not displayed]

FINDINGS: Parenchymal Echotexture: Mildly heterogenous

Isthmus: 3 mm

Right lobe: 4.8 x 1.9 x 1.9 cm

Left lobe: 4.9 x 1.6 x 1.7 cm

_________________________________________________________

Estimated total number of nodules >/= 1 cm: 1

Number of spongiform nodules >/=  2 cm not described below (TR1): 0

Number of mixed cystic and solid nodules >/= 1.5 cm not described
below (TR2): 0

_________________________________________________________

Nodule # 1:

Location: Right; Mid

Maximum size: 1.0 cm; Other 2 dimensions: 0.7 x 0.7 cm

Composition: solid/almost completely solid (2)

Echogenicity: hypoechoic (2)

Shape: not taller-than-wide (0)

Margins: smooth (0)

Echogenic foci: none (0)

ACR TI-RADS total points: 4.

ACR TI-RADS risk category: TR4 (4-6 points).

ACR TI-RADS recommendations:

*Given size (>/= 1 - 1.4 cm) and appearance, a follow-up ultrasound
in 1 year should be considered based on TI-RADS criteria.

_________________________________________________________

There are additional bilateral subcentimeter hypoechoic and cystic
nodules noted, all measuring 6 mm or less in size. These would not
meet criteria for any biopsy or follow-up and are not fully
described by TI rads criteria.

No hypervascularity.  No regional adenopathy.
IMPRESSION: 1 cm right mid thyroid TR 4 nodule meets criteria for follow-up in 1
year.

Additional subcentimeter nodules noted.

The above is in keeping with the ACR TI-RADS recommendations - [HOSPITAL] 7359;[DATE].

## 2021-10-09 ENCOUNTER — Telehealth: Payer: Self-pay | Admitting: Urology

## 2021-10-09 NOTE — Telephone Encounter (Signed)
Patient called in and would like a call back to speak to someone about her upcoming appt and her condition.

## 2021-10-12 NOTE — Telephone Encounter (Signed)
2nd attempt to leave vm-vm is full, unable to leave message.

## 2021-10-17 NOTE — Progress Notes (Incomplete)
10/17/21 6:12 PM   Lydia Kennedy 07-02-1991 [id number]  Referring provider:  Kerin Salen, PA-C [address] [place],  [place] [address] No chief complaint on file.    HPI: Lydia Kennedy is a 30 y.o.female who presents today for further evaluation of left kidney pain.       PMH: Past Medical History:  Diagnosis Date   ADHD    Anemia    Anxiety    Chronic kidney disease    kidney reflux, problem more as child/ recuurent interstitial cystitis   Family history of breast cancer    7/21 cancer genetic testing letter sent   GERD (gastroesophageal reflux disease)    Headache    daily   IC (interstitial cystitis)    Infection    UTI   Thyroid dysfunction    Tonsillitis     Surgical History: Past Surgical History:  Procedure Laterality Date   BREAST SURGERY     augmentation   CESAREAN SECTION     ESOPHAGOGASTRODUODENOSCOPY (EGD) WITH PROPOFOL N/A 07/07/2018   Procedure: ESOPHAGOGASTRODUODENOSCOPY (EGD) WITH PROPOFOL;  Surgeon: Midge Minium, MD;  Location: [facility];  Service: Endoscopy;  Laterality: N/A;   TONSILLECTOMY N/A 01/29/2018   Procedure: TONSILLECTOMY;  Surgeon: Bud Face, MD;  Location: [facility];  Service: ENT;  Laterality: N/A;  UPREG    Home Medications:  Allergies as of 10/18/2021       Reactions   Cephalexin Anaphylaxis   Penicillins Anaphylaxis, Shortness Of Breath   Throat swelling Throat swelling Throat swelling Throat swelling Throat swelling Throat swelling Throat swelling        Medication List        Accurate as of October 17, 2021  6:12 PM. If you have any questions, ask your nurse or doctor.          ALPRAZolam 0.5 MG tablet Commonly known as: XANAX Take 2 mg by mouth 3 (three) times daily as needed for anxiety.   amphetamine-dextroamphetamine 15 MG tablet Commonly known as: ADDERALL Take 30 mg by mouth 3 (three) times daily. Only takes while in school    cetirizine 10 MG tablet Commonly known as: ZYRTEC Take 10 mg by mouth daily.   Dexilant 60 MG capsule Generic drug: dexlansoprazole Take 1 capsule (60 mg total) by mouth daily.   flavoxATE 100 MG tablet Commonly known as: URISPAS Take 1 tablet (100 mg total) by mouth 3 (three) times daily as needed for bladder spasms (bladder spasm).   ibuprofen 200 MG tablet Commonly known as: ADVIL Take 200 mg by mouth every 6 (six) hours as needed.   LYSINE PO Take by mouth daily.   medroxyPROGESTERone 10 MG tablet Commonly known as: Provera Take 1 tablet (10 mg total) by mouth daily for 10 days.   OMEPRAZOLE PO Take 40 mg by mouth daily. am   ondansetron 4 MG disintegrating tablet Commonly known as: Zofran ODT Take 1 tablet (4 mg total) by mouth every 8 (eight) hours as needed for nausea or vomiting.   oxyCODONE-acetaminophen 5-325 MG tablet Commonly known as: PERCOCET/ROXICET Take 1 tablet by mouth every 4 (four) hours as needed for severe pain.   VITAMIN C PO Take by mouth daily.   VITAMIN D3 PO Take by mouth daily.        Allergies:  Allergies  Allergen Reactions   Cephalexin Anaphylaxis   Penicillins Anaphylaxis and Shortness Of Breath    Throat swelling Throat swelling Throat swelling Throat swelling Throat  swelling Throat swelling Throat swelling     Family History: Family History  Problem Relation Age of Onset   Breast cancer Mother    Hypertension Father    Pancreatic cancer Maternal Grandmother     Social History:  reports that she quit smoking about 3 years ago. Her smoking use included cigarettes. She smoked an average of .25 packs per day. She has never used smokeless tobacco. She reports current alcohol use of about 3.0 standard drinks per week. She reports that she does not use drugs.   Physical Exam: There were no vitals taken for this visit.  Constitutional:  Alert and oriented, No acute distress. HEENT: [place] AT, moist mucus membranes.   Trachea midline, no masses. Cardiovascular: No clubbing, cyanosis, or edema. Respiratory: Normal respiratory effort, no increased work of breathing. Skin: No rashes, bruises or suspicious lesions. Neurologic: Grossly intact, no focal deficits, moving all 4 extremities. Psychiatric: Normal mood and affect.  Laboratory Data:  Lab Results  Component Value Date   CREATININE 0.75 07/23/2021      Urinalysis   Pertinent Imaging:    Assessment & Plan:     No follow-ups on file.  I,Kailey Littlejohn,acting as a Neurosurgeon for Vanna Scotland, MD.,have documented all relevant documentation on the behalf of Vanna Scotland, MD,as directed by  Vanna Scotland, MD while in the presence of Vanna Scotland, MD.   [facility] [address], [address] [place], [place] [address] [phone]

## 2021-10-18 ENCOUNTER — Ambulatory Visit: Payer: Self-pay | Admitting: Urology

## 2021-10-19 ENCOUNTER — Encounter: Payer: Self-pay | Admitting: Urology

## 2021-10-24 DIAGNOSIS — I10 Essential (primary) hypertension: Secondary | ICD-10-CM | POA: Insufficient documentation

## 2021-10-24 DIAGNOSIS — F909 Attention-deficit hyperactivity disorder, unspecified type: Secondary | ICD-10-CM | POA: Insufficient documentation

## 2021-11-09 ENCOUNTER — Ambulatory Visit
Admission: EM | Admit: 2021-11-09 | Discharge: 2021-11-09 | Disposition: A | Discharge status: Home / Self Care | Payer: Medicaid Other | Attending: Internal Medicine | Admitting: Internal Medicine

## 2021-11-09 ENCOUNTER — Other Ambulatory Visit: Payer: Self-pay

## 2021-11-09 ENCOUNTER — Encounter: Payer: Self-pay | Admitting: Emergency Medicine

## 2021-11-09 DIAGNOSIS — R1012 Left upper quadrant pain: Secondary | ICD-10-CM | POA: Insufficient documentation

## 2021-11-09 LAB — URINALYSIS, COMPLETE (UACMP) WITH MICROSCOPIC
Bacteria, UA: NONE SEEN
Bilirubin Urine: NEGATIVE
Glucose, UA: NEGATIVE mg/dL
Hgb urine dipstick: NEGATIVE
Ketones, ur: NEGATIVE mg/dL
Leukocytes,Ua: NEGATIVE
Nitrite: NEGATIVE
Protein, ur: NEGATIVE mg/dL
Specific Gravity, Urine: <1.005 — ABNORMAL LOW (ref 1.005–1.030)
pH: 6 (ref 5.0–8.0)

## 2021-11-09 LAB — PREGNANCY, URINE: Preg Test, Ur: NEGATIVE

## 2021-11-09 MED ORDER — LIDOCAINE VISCOUS HCL 2 % MT SOLN
15.0000 mL | Freq: Once | OROMUCOSAL | Status: AC
  Administered 2021-11-09: 17:00:00 15 mL via ORAL

## 2021-11-09 MED ORDER — ALUM & MAG HYDROXIDE-SIMETH 200-200-20 MG/5ML PO SUSP
30.0000 mL | Freq: Once | ORAL | Status: AC
  Administered 2021-11-09: 17:00:00 30 mL via ORAL

## 2021-11-09 NOTE — ED Triage Notes (Signed)
LUQ abd pain with n/v/d, x 2 days

## 2021-11-09 NOTE — ED Provider Notes (Signed)
[facility]    CSN: [id number] Arrival date & time: 11/09/21  1512      History   Chief Complaint Chief Complaint  Patient presents with   Abdominal Pain    LUQ    HPI Lydia Kennedy is a 30 y.o. female who presents with LUQ pain x 2 days with N/V/D. Had one diarrhea BM today and no vomiting. Yesterday vomited about 5 times and diarrhea about the same. Has not had a fever. Has been taking her Omeprazole. She states he pain is radiating to her L back. Only drinks on occasion. Denies blood in her stool or black stools.     Past Medical History:  Diagnosis Date   ADHD    Anemia    Anxiety    Chronic kidney disease    kidney reflux, problem more as child/ recuurent interstitial cystitis   Family history of breast cancer    7/21 cancer genetic testing letter sent   GERD (gastroesophageal reflux disease)    Headache    daily   IC (interstitial cystitis)    Infection    UTI   Thyroid dysfunction    Tonsillitis     Patient Active Problem List   Diagnosis Date Noted   Other somatoform disorders    Gastroesophageal reflux disease without esophagitis    Acute gastritis without hemorrhage    Generalized anxiety disorder 05/04/2016   Acute upper respiratory infection 05/04/2016   Low TSH level 05/04/2016   Low back pain 05/04/2016    Past Surgical History:  Procedure Laterality Date   BREAST SURGERY     augmentation   CESAREAN SECTION     ESOPHAGOGASTRODUODENOSCOPY (EGD) WITH PROPOFOL N/A 07/07/2018   Procedure: ESOPHAGOGASTRODUODENOSCOPY (EGD) WITH PROPOFOL;  Surgeon: Midge Minium, MD;  Location: [facility];  Service: Endoscopy;  Laterality: N/A;   TONSILLECTOMY N/A 01/29/2018   Procedure: TONSILLECTOMY;  Surgeon: Bud Face, MD;  Location: [facility];  Service: ENT;  Laterality: N/A;  UPREG    OB History     Gravida  1   Para  1   Term  1   Preterm      AB      Living  1      SAB      IAB      Ectopic       Multiple      Live Births  1            Home Medications    Prior to Admission medications   Medication Sig Start Date End Date Taking? Authorizing Provider  ALPRAZolam Prudy Feeler) 0.5 MG tablet Take 2 mg by mouth 3 (three) times daily as needed for anxiety.     [provider]  amphetamine-dextroamphetamine (ADDERALL) 15 MG tablet Take 30 mg by mouth 3 (three) times daily. Only takes while in school    [provider]  Ascorbic Acid (VITAMIN C PO) Take by mouth daily.    [provider]  cetirizine (ZYRTEC) 10 MG tablet Take 10 mg by mouth daily.    [provider]  Cholecalciferol (VITAMIN D3 PO) Take by mouth daily.    [provider]  dexlansoprazole (DEXILANT) 60 MG capsule Take 1 capsule (60 mg total) by mouth daily. 04/04/20   Midge Minium, MD  flavoxATE (URISPAS) 100 MG tablet Take 1 tablet (100 mg total) by mouth 3 (three) times daily as needed for bladder spasms (bladder spasm). 07/19/20   Nadara Mustard,  MD  ibuprofen (ADVIL,MOTRIN) 200 MG tablet Take 200 mg by mouth every 6 (six) hours as needed.    [provider]  LYSINE PO Take by mouth daily.    [provider]  medroxyPROGESTERone (PROVERA) 10 MG tablet Take 1 tablet (10 mg total) by mouth daily for 10 days. 07/06/20 07/16/20  Nadara Mustard, MD  OMEPRAZOLE PO Take 40 mg by mouth daily. am    [provider]  ondansetron (ZOFRAN ODT) 4 MG disintegrating tablet Take 1 tablet (4 mg total) by mouth every 8 (eight) hours as needed for nausea or vomiting. 07/23/21   Irean Hong, MD    Family History Family History  Problem Relation Age of Onset   Breast cancer Mother    Hypertension Father    Pancreatic cancer Maternal Grandmother     Social History Social History   Tobacco Use   Smoking status: Former    Packs/day: 0.25    Types: Cigarettes    Quit date: 01/2018    Years since quitting: 3.7   Smokeless tobacco: Never  Vaping Use   Vaping  Use: Every day  Substance Use Topics   Alcohol use: Yes    Alcohol/week: 3.0 standard drinks    Types: 3 Standard drinks or equivalent per week    Comment: occ   Drug use: No     Allergies   Cephalexin and Penicillins   Review of Systems Review of Systems  Constitutional:  Negative for appetite change.    Physical Exam Triage Vital Signs ED Triage Vitals  Enc Vitals Group     BP 11/09/21 1537 (!) 136/105     Pulse Rate 11/09/21 1537 88     Resp 11/09/21 1537 18     Temp 11/09/21 1537 98.1 F (36.7 C)     Temp Source 11/09/21 1537 Oral     SpO2 11/09/21 1537 100 %     Weight --      Height --      Head Circumference --      Peak Flow --      Pain Score 11/09/21 1540 8     Pain Loc --      Pain Edu? --      Excl. in GC? --    No data found.  Updated Vital Signs BP (!) 136/105 (BP Location: Right Arm)   Pulse 88   Temp 98.1 F (36.7 C) (Oral)   Resp 18   LMP 09/24/2021 (Exact Date)   SpO2 100%   Visual Acuity Right Eye Distance:   Left Eye Distance:   Bilateral Distance:    Right Eye Near:   Left Eye Near:    Bilateral Near:     Physical Exam Physical Exam Vitals and nursing note reviewed.  Constitutional:      General: She is not in acute distress.    Appearance: She is not toxic-appearing.  HENT:     Head: Normocephalic.     Right Ear: External ear normal.     Left Ear: External ear normal.  Eyes:     General: No scleral icterus.    Conjunctiva/sclera: Conjunctivae normal.  Pulmonary:     Effort: Pulmonary effort is normal.  Abdominal:     General: Bowel sounds are normal.     Palpations: Abdomen is soft. There is no mass.     Tenderness: There is no guarding or rebound. Has moderate tenderness on epigastric and L mid to upper abd.  Comments: + CVA tenderness  on L Musculoskeletal:        General: Normal range of motion.     Cervical back: Neck supple.     Comments: BACK- has muscular tenderness on area of complaint with palpation   Skin:    General: Skin is warm and dry.     Findings: No rash.  Neurological:     Mental Status: She is alert and oriented to person, place, and time.     Gait: Gait normal.  Psychiatric:        Mood and Affect: Mood normal.        Behavior: Behavior normal.        Thought Content: Thought content normal.        Judgment: Judgment normal.    UC Treatments / Results  Labs (all labs ordered are listed, but only abnormal results are displayed) Labs Reviewed  URINALYSIS, COMPLETE (UACMP) WITH MICROSCOPIC - Abnormal; Notable for the following components:      Result Value   Specific Gravity, Urine <1.005 (*)    All other components within normal limits  PREGNANCY, URINE  Urine Pregnancy test is neg.   EKG   Radiology No results found.  Procedures Procedures (including critical care time)  Medications Ordered in UC Medications  alum & mag hydroxide-simeth (MAALOX/MYLANTA) 200-200-20 MG/5ML suspension 30 mL (30 mLs Oral Given 11/09/21 1634)    And  lidocaine (XYLOCAINE) 2 % viscous mouth solution 15 mL (15 mLs Oral Given 11/09/21 1634)    Initial Impression / Assessment and Plan / UC Course  I have reviewed the triage vital signs and the nursing notes. Labs were reviewed with pt. She was given Gi cocktail trial and after 20 minutes I checked with her and she felt it did not help her, and her L back was still hurting.  Was sent to ER for further work up.   Final Clinical Impressions(s) / UC Diagnoses   Final diagnoses:  Abdominal pain, left upper quadrant     Discharge Instructions      Go to Er to have further work up for your pain.      ED Prescriptions   None    PDMP not reviewed this encounter.   Garey Ham, PA-C 11/09/21 1810

## 2021-11-09 NOTE — Discharge Instructions (Signed)
Go to Er to have further work up for your pain.

## 2021-11-10 ENCOUNTER — Emergency Department: Admit: 2021-11-10 | Payer: Medicaid Other

## 2021-12-24 HISTORY — DX: Maternal care for unspecified type scar from previous cesarean delivery: O34.219

## 2022-01-26 ENCOUNTER — Ambulatory Visit: Payer: Medicaid Other | Attending: Otolaryngology

## 2022-07-26 ENCOUNTER — Ambulatory Visit: Payer: Medicaid Other

## 2022-08-06 ENCOUNTER — Other Ambulatory Visit: Payer: Self-pay | Admitting: Nephrology

## 2022-08-06 DIAGNOSIS — R109 Unspecified abdominal pain: Secondary | ICD-10-CM

## 2022-08-08 ENCOUNTER — Ambulatory Visit: Payer: Medicaid Other

## 2022-08-13 ENCOUNTER — Ambulatory Visit (INDEPENDENT_AMBULATORY_CARE_PROVIDER_SITE_OTHER): Payer: Medicaid Other | Admitting: Urology

## 2022-08-13 ENCOUNTER — Encounter: Payer: Self-pay | Admitting: Urology

## 2022-08-13 VITALS — BP 152/108 | HR 101 | Ht 64.0 in | Wt 134.0 lb

## 2022-08-13 DIAGNOSIS — N390 Urinary tract infection, site not specified: Secondary | ICD-10-CM | POA: Diagnosis not present

## 2022-08-13 DIAGNOSIS — G8929 Other chronic pain: Secondary | ICD-10-CM

## 2022-08-13 DIAGNOSIS — M545 Low back pain, unspecified: Secondary | ICD-10-CM | POA: Diagnosis not present

## 2022-08-13 DIAGNOSIS — N3021 Other chronic cystitis with hematuria: Secondary | ICD-10-CM

## 2022-08-13 DIAGNOSIS — R109 Unspecified abdominal pain: Secondary | ICD-10-CM | POA: Diagnosis not present

## 2022-08-13 DIAGNOSIS — R3129 Other microscopic hematuria: Secondary | ICD-10-CM

## 2022-08-13 LAB — URINALYSIS, COMPLETE
Bilirubin, UA: NEGATIVE
Glucose, UA: NEGATIVE
Ketones, UA: NEGATIVE
Nitrite, UA: POSITIVE — AB
Protein,UA: NEGATIVE
Specific Gravity, UA: 1.02 (ref 1.005–1.030)
Urobilinogen, Ur: 0.2 mg/dL (ref 0.2–1.0)
pH, UA: 6 (ref 5.0–7.5)

## 2022-08-13 LAB — MICROSCOPIC EXAMINATION: WBC, UA: >30 /hpf — AB (ref 0–5)

## 2022-08-13 NOTE — Progress Notes (Signed)
08/13/2022 10:01 AM   Lydia Kennedy 1991-03-01 [id number]  Referring provider: Mady Haagensen, MD [address] [place],  [place] [address]  Chief Complaint  Patient presents with   Recurrent UTI    HPI: Lydia Kennedy is a 31 y.o. female referred for evaluation of a recurrent UTI.  States she was diagnosed with vesicoureteral reflux at age 8 and took prophylactic antibiotics for several years and did not require surgical repair Long history recurrent urinary tract infections which have worsened over the last year.  [facility] ED visits 7/12 and 07/09/2022 with complaints of suprapubic and flank pain.Urine culture grew MDR E. Coli No upper tract imaging performed during this hospitalization Complains of chronic bilateral low back pain which she describes as burning sensation in her kidneys Last upper tract imaging on Epic review was a RUS July 2010 which showed no abnormalities States she was diagnosed with interstitial cystitis ~ 10 years ago  PMH: Past Medical History:  Diagnosis Date   ADHD    Anemia    Anxiety    Chronic kidney disease    kidney reflux, problem more as child/ recuurent interstitial cystitis   Family history of breast cancer    7/21 cancer genetic testing letter sent   GERD (gastroesophageal reflux disease)    Headache    daily   IC (interstitial cystitis)    Infection    UTI   Thyroid dysfunction    Tonsillitis     Surgical History: Past Surgical History:  Procedure Laterality Date   BREAST SURGERY     augmentation   CESAREAN SECTION     ESOPHAGOGASTRODUODENOSCOPY (EGD) WITH PROPOFOL N/A 07/07/2018   Procedure: ESOPHAGOGASTRODUODENOSCOPY (EGD) WITH PROPOFOL;  Surgeon: Midge Minium, MD;  Location: [facility];  Service: Endoscopy;  Laterality: N/A;   TONSILLECTOMY N/A 01/29/2018   Procedure: TONSILLECTOMY;  Surgeon: Bud Face, MD;  Location: [facility];  Service: ENT;  Laterality: N/A;  UPREG     Home Medications:  Allergies as of 08/13/2022       Reactions   Cephalexin Anaphylaxis   Penicillins Anaphylaxis, Shortness Of Breath   Throat swelling Throat swelling Throat swelling Throat swelling Throat swelling Throat swelling Throat swelling        Medication List        Accurate as of August 13, 2022 10:01 AM. If you have any questions, ask your nurse or doctor.          STOP taking these medications    medroxyPROGESTERone 10 MG tablet Commonly known as: Provera Stopped by: Riki Altes, MD       TAKE these medications    ALPRAZolam 0.5 MG tablet Commonly known as: XANAX Take 2 mg by mouth 3 (three) times daily as needed for anxiety.   amphetamine-dextroamphetamine 15 MG tablet Commonly known as: ADDERALL Take 30 mg by mouth 3 (three) times daily. Only takes while in school   cetirizine 10 MG tablet Commonly known as: ZYRTEC Take 10 mg by mouth daily.   Dexilant 60 MG capsule Generic drug: dexlansoprazole Take 1 capsule (60 mg total) by mouth daily.   flavoxATE 100 MG tablet Commonly known as: URISPAS Take 1 tablet (100 mg total) by mouth 3 (three) times daily as needed for bladder spasms (bladder spasm).   ibuprofen 200 MG tablet Commonly known as: ADVIL Take 200 mg by mouth every 6 (six) hours as needed.   LYSINE PO Take by mouth daily.   OMEPRAZOLE  PO Take 40 mg by mouth daily. am   ondansetron 4 MG disintegrating tablet Commonly known as: Zofran ODT Take 1 tablet (4 mg total) by mouth every 8 (eight) hours as needed for nausea or vomiting.   VITAMIN C PO Take by mouth daily.   VITAMIN D3 PO Take by mouth daily.        Allergies:  Allergies  Allergen Reactions   Cephalexin Anaphylaxis   Penicillins Anaphylaxis and Shortness Of Breath    Throat swelling Throat swelling Throat swelling Throat swelling Throat swelling Throat swelling Throat swelling     Family History: Family History  Problem Relation  Age of Onset   Breast cancer Mother    Hypertension Father    Pancreatic cancer Maternal Grandmother     Social History:  reports that she quit smoking about 4 years ago. Her smoking use included cigarettes. She smoked an average of .25 packs per day. She has never used smokeless tobacco. She reports current alcohol use of about 3.0 standard drinks of alcohol per week. She reports that she does not use drugs.   Physical Exam: BP (!) 152/108   Pulse (!) 101   Ht 5\' 4"  (1.626 m)   Wt 134 lb (60.8 kg)   BMI 23.00 kg/m   Constitutional:  Alert, No acute distress. HEENT: [place] AT Respiratory: Normal respiratory effort, no increased work of breathing. Psychiatric: Normal mood and affect.  Laboratory Data:  Urinalysis Dipstick 1+ blood/1+ leukocyte/nitrite positive Microscopy >30 WBC/11-30 RBC/many bacteria   Assessment & Plan:    1.  Recurrent UTI History pyelonephritis and multidrug-resistant E. Coli UA today with significant pyuria/microhematuria Urine ordered CT urogram ordered  2.  Chronic low back/flank pain CTU as above We discussed if no significant abnormalities noted on CTU it is unlikely that her pain is renal in origin and may be musculoskeletal She requested narcotics pain medication and I informed her I would be unable to prescribe as the cause of her pain is unknown at this point.  Will await CT results  3.  History interstitial cystitis  I spent 45 total minutes on the day of the encounter including pre-visit review of the medical record, face-to-face time with the patient, and post visit ordering of labs/imaging/tests.   09-18-2004, MD  [facility] [address], [address] [place], [place] [id number]

## 2022-08-14 ENCOUNTER — Telehealth: Payer: Self-pay | Admitting: Family Medicine

## 2022-08-14 NOTE — Telephone Encounter (Signed)
Patient called and states she is in pain with urination. I explained we have sent her urine for culture and that we are hesitant on sending in a antibiotic at this time because the urine cultures in the past are multi resistant to the form of out growth she has. I also explained she is allergic to a couple of the medications. I explained there is an order for a CT scan in and once the insurance company approves the CT scan the imaging department will contact her to set up an appointment. I informed her we will call in a couple days with the urine culture result. Patient voiced understanding.

## 2022-08-15 ENCOUNTER — Encounter: Payer: Self-pay | Admitting: Urology

## 2022-08-16 ENCOUNTER — Other Ambulatory Visit: Payer: Self-pay | Admitting: *Deleted

## 2022-08-16 LAB — CULTURE, URINE COMPREHENSIVE

## 2022-08-16 MED ORDER — SULFAMETHOXAZOLE-TRIMETHOPRIM 800-160 MG PO TABS: 1.0000 | ORAL_TABLET | Freq: Two times a day (BID) | ORAL | 0 refills | Status: DC

## 2022-08-21 ENCOUNTER — Telehealth: Payer: Medicaid Other | Admitting: Family

## 2022-08-21 DIAGNOSIS — Z87440 Personal history of urinary (tract) infections: Secondary | ICD-10-CM | POA: Diagnosis not present

## 2022-08-21 DIAGNOSIS — M25562 Pain in left knee: Secondary | ICD-10-CM | POA: Diagnosis not present

## 2022-08-21 MED ORDER — DOXYCYCLINE HYCLATE 100 MG PO TABS: 100.0000 mg | ORAL_TABLET | Freq: Two times a day (BID) | ORAL | 0 refills | Status: DC

## 2022-08-21 NOTE — Progress Notes (Signed)
Virtual Visit Consent   Lydia Kennedy, you are scheduled for a virtual visit with a [employer] provider today. Just as with appointments in the office, your consent must be obtained to participate. Your consent will be active for this visit and any virtual visit you may have with one of our providers in the next 365 days. If you have a MyChart account, a copy of this consent can be sent to you electronically.  As this is a virtual visit, video technology does not allow for your provider to perform a traditional examination. This may limit your provider's ability to fully assess your condition. If your provider identifies any concerns that need to be evaluated in person or the need to arrange testing (such as labs, EKG, etc.), we will make arrangements to do so. Although advances in technology are sophisticated, we cannot ensure that it will always work on either your end or our end. If the connection with a video visit is poor, the visit may have to be switched to a telephone visit. With either a video or telephone visit, we are not always able to ensure that we have a secure connection.  By engaging in this virtual visit, you consent to the provision of healthcare and authorize for your insurance to be billed (if applicable) for the services provided during this visit. Depending on your insurance coverage, you may receive a charge related to this service.  I need to obtain your verbal consent now. Are you willing to proceed with your visit today? Lynsie Mcwatters Castrillo has provided verbal consent on 08/21/2022 for a virtual visit (video or telephone). Jannifer Rodney, FNP  Date: 08/21/2022 5:51 PM  Virtual Visit via Video Note   I, Jannifer Rodney, connected with  Lydia Kennedy  ([id number], June 26, 1991) on 08/21/22 at  6:00 PM EDT by a video-enabled telemedicine application and verified that I am speaking with the correct person using two identifiers.  Location: Patient: Virtual Visit  Location Patient: Home Provider: Virtual Visit Location Provider: Home Office   I discussed the limitations of evaluation and management by telemedicine and the availability of in person appointments. The patient expressed understanding and agreed to proceed.    History of Present Illness: Lydia Kennedy is a 31 y.o. who identifies as a female who was assigned female at birth, and is being seen today for left knee pain that started 08/14/22. States she started Bactrim on 08/13/22 for a UTI. Reports the knee is mildly swollen and warm to touch.   Pt is worried this is an allergic reaction to Bactrim.  No hx of gout.   HPI: Knee Pain  The incident occurred 5 to 7 days ago. There was no injury mechanism. The pain is present in the left knee. The quality of the pain is described as aching. The pain is at a severity of 8/10. The pain is mild. The pain has been Intermittent since onset. Pertinent negatives include no loss of motion, loss of sensation, numbness or tingling. She reports no foreign bodies present. The symptoms are aggravated by weight bearing and movement. She has tried NSAIDs and acetaminophen for the symptoms. The treatment provided mild relief.    Problems:  Patient Active Problem List   Diagnosis Date Noted   Other somatoform disorders    Gastroesophageal reflux disease without esophagitis    Acute gastritis without hemorrhage    Generalized anxiety disorder 05/04/2016   Acute upper respiratory infection 05/04/2016   Low TSH level 05/04/2016  Low back pain 05/04/2016    Allergies:  Allergies  Allergen Reactions   Cephalexin Anaphylaxis   Penicillins Anaphylaxis and Shortness Of Breath    Throat swelling Throat swelling Throat swelling Throat swelling Throat swelling Throat swelling Throat swelling    Medications:  Current Outpatient Medications:    doxycycline (VIBRA-TABS) 100 MG tablet, Take 1 tablet (100 mg total) by mouth 2 (two) times daily., Disp: 20  tablet, Rfl: 0   ALPRAZolam (XANAX) 0.5 MG tablet, Take 2 mg by mouth 3 (three) times daily as needed for anxiety. , Disp: , Rfl:    amphetamine-dextroamphetamine (ADDERALL) 15 MG tablet, Take 30 mg by mouth 3 (three) times daily. Only takes while in school, Disp: , Rfl:    Ascorbic Acid (VITAMIN C PO), Take by mouth daily., Disp: , Rfl:    cetirizine (ZYRTEC) 10 MG tablet, Take 10 mg by mouth daily., Disp: , Rfl:    Cholecalciferol (VITAMIN D3 PO), Take by mouth daily., Disp: , Rfl:    flavoxATE (URISPAS) 100 MG tablet, Take 1 tablet (100 mg total) by mouth 3 (three) times daily as needed for bladder spasms (bladder spasm)., Disp: 10 tablet, Rfl: 1   ibuprofen (ADVIL,MOTRIN) 200 MG tablet, Take 200 mg by mouth every 6 (six) hours as needed., Disp: , Rfl:    LYSINE PO, Take by mouth daily., Disp: , Rfl:    OMEPRAZOLE PO, Take 40 mg by mouth daily. am, Disp: , Rfl:    ondansetron (ZOFRAN ODT) 4 MG disintegrating tablet, Take 1 tablet (4 mg total) by mouth every 8 (eight) hours as needed for nausea or vomiting., Disp: 15 tablet, Rfl: 0  Observations/Objective: Patient is well-developed, well-nourished in no acute distress.  Resting comfortably  at home.  Head is normocephalic, atraumatic.  No labored breathing.  Speech is clear and coherent with logical content.  Patient is alert and oriented at baseline.  No redness noted of left knee, mild edema present  Assessment and Plan: 1. Acute pain of left knee  2. Hx: UTI (urinary tract infection)  I do not believe this is related to Bactrim.  Will treat as infection. Stop Bactrim and start doxycyline. Urine culture reviewed and doxycyline is covered.  Rest Ice Force fluids No hx of gout Take Motrin 600 mg QID  Follow Up Instructions: I discussed the assessment and treatment plan with the patient. The patient was provided an opportunity to ask questions and all were answered. The patient agreed with the plan and demonstrated an  understanding of the instructions.  A copy of instructions were sent to the patient via MyChart unless otherwise noted below.     The patient was advised to call back or seek an in-person evaluation if the symptoms worsen or if the condition fails to improve as anticipated.  Time:  I spent 13 minutes with the patient via telehealth technology discussing the above problems/concerns.    Jannifer Rodney, FNP

## 2022-08-21 NOTE — Patient Instructions (Signed)
Acute Knee Pain, Adult Acute knee pain is sudden and may be caused by damage, swelling, or irritation of the muscles and tissues that support the knee. Pain may result from: A fall. An injury to the knee from twisting motions. A hit to the knee. Infection. Acute knee pain may go away on its own with time and rest. If it does not, your health care provider may order tests to find the cause of the pain. These may include: Imaging tests, such as an X-ray, MRI, CT scan, or ultrasound. Joint aspiration. In this test, fluid is removed from the knee and evaluated. Arthroscopy. In this test, a lighted tube is inserted into the knee and an image is projected onto a TV screen. Biopsy. In this test, a sample of tissue is removed from the body and studied under a microscope. Follow these instructions at home: If you have a knee sleeve or brace:  Wear the knee sleeve or brace as told by your health care provider. Remove it only as told by your health care provider. Loosen it if your toes tingle, become numb, or turn cold and blue. Keep it clean. If the knee sleeve or brace is not waterproof: Do not let it get wet. Cover it with a watertight covering when you take a bath or shower. Activity Rest your knee. Do not do things that cause pain or make pain worse. Avoid high-impact activities or exercises, such as running, jumping rope, or doing jumping jacks. Work with a physical therapist to make a safe exercise program, as recommended by your health care provider. Do exercises as told by your physical therapist. Managing pain, stiffness, and swelling  If directed, put ice on the affected knee. To do this: If you have a removable knee sleeve or brace, remove it as told by your health care provider. Put ice in a plastic bag. Place a towel between your skin and the bag. Leave the ice on for 20 minutes, 2-3 times a day. Remove the ice if your skin turns bright red. This is very important. If you cannot  feel pain, heat, or cold, you have a greater risk of damage to the area. If directed, use an elastic bandage to put pressure (compression) on your injured knee. This may control swelling, give support, and help with discomfort. Raise (elevate) your knee above the level of your heart while you are sitting or lying down. Sleep with a pillow under your knee. General instructions Take over-the-counter and prescription medicines only as told by your health care provider. Do not use any products that contain nicotine or tobacco, such as cigarettes, e-cigarettes, and chewing tobacco. If you need help quitting, ask your health care provider. If you are overweight, work with your health care provider and a dietitian to set a weight-loss goal that is healthy and reasonable for you. Extra weight can put pressure on your knee. Pay attention to any changes in your symptoms. Keep all follow-up visits. This is important. Contact a health care provider if: Your knee pain continues, changes, or gets worse. You have a fever along with knee pain. Your knee feels warm to the touch or is red. Your knee buckles or locks up. Get help right away if: Your knee swells, and the swelling becomes worse. You cannot move your knee. You have severe pain in your knee that cannot be managed with pain medicine. Summary Acute knee pain can be caused by a fall, an injury, an infection, or damage, swelling, or irritation   of the tissues that support your knee. Your health care provider may perform tests to find out the cause of the pain. Pay attention to any changes in your symptoms. Relieve your pain with rest, medicines, light activity, and the use of ice. Get help right away if your knee swells, you cannot move your knee, or you have severe pain that cannot be managed with medicine. This information is not intended to replace advice given to you by your health care provider. Make sure you discuss any questions you have with  your health care provider. Document Revised: 05/25/2020 Document Reviewed: 05/25/2020 Elsevier Patient Education  2023 [employer]  

## 2022-08-28 ENCOUNTER — Telehealth: Payer: Self-pay | Admitting: Family Medicine

## 2022-08-28 ENCOUNTER — Ambulatory Visit
Admission: RE | Admit: 2022-08-28 | Discharge: 2022-08-28 | Disposition: A | Discharge status: Home / Self Care | Payer: Medicaid Other | Source: Ambulatory Visit | Attending: Urology | Admitting: Urology

## 2022-08-28 DIAGNOSIS — R3129 Other microscopic hematuria: Secondary | ICD-10-CM

## 2022-08-28 DIAGNOSIS — G8929 Other chronic pain: Secondary | ICD-10-CM

## 2022-08-28 DIAGNOSIS — N390 Urinary tract infection, site not specified: Secondary | ICD-10-CM

## 2022-08-28 NOTE — Telephone Encounter (Signed)
Zuhr Daisy Floro , RTCalled and states patient came in for CT and refused to do the contrast, she doesn't have an allergy she just has kidney issues and doesn't want to risk the contrast. I told her we can check her kidney function and make sure and she still said no. She wants to do an abdomen/pelvis without and I told her it probably wouldn't be very diagnostic without the contrast.  She is walking down there right now to speak to someone. I told her to cancel the CT for today and I would talk to Dr. Lonna Cobb and see what the next plan will be.

## 2022-08-28 NOTE — Telephone Encounter (Signed)
pt said the person doing the CT scan said it can be done...pt is here. She states she need CT w/out contrast order to be put in.   She was about to have the CT with the contrast.  Can this be done today??

## 2022-08-29 ENCOUNTER — Ambulatory Visit: Admission: RE | Admit: 2022-08-29 | Payer: Medicaid Other | Source: Ambulatory Visit

## 2022-08-29 NOTE — Telephone Encounter (Signed)
CT urogram is going to be the best imaging study for evaluation of her symptoms

## 2022-08-29 NOTE — Telephone Encounter (Signed)
Pt calling asking for advice about the CT scan, pt scheduled appt to discuss results of [place] ordered by Dr. Cherylann Ratel.

## 2022-08-30 ENCOUNTER — Encounter: Payer: Self-pay | Admitting: Family Medicine

## 2022-08-31 ENCOUNTER — Other Ambulatory Visit: Payer: Self-pay

## 2022-08-31 ENCOUNTER — Emergency Department: Payer: Medicaid Other

## 2022-08-31 ENCOUNTER — Emergency Department
Admission: EM | Admit: 2022-08-31 | Discharge: 2022-08-31 | Disposition: A | Discharge status: Home / Self Care | Payer: Medicaid Other | Attending: Emergency Medicine | Admitting: Emergency Medicine

## 2022-08-31 DIAGNOSIS — R109 Unspecified abdominal pain: Secondary | ICD-10-CM | POA: Diagnosis present

## 2022-08-31 DIAGNOSIS — N189 Chronic kidney disease, unspecified: Secondary | ICD-10-CM | POA: Insufficient documentation

## 2022-08-31 DIAGNOSIS — R10A2 Flank pain, left side: Secondary | ICD-10-CM

## 2022-08-31 LAB — URINALYSIS, ROUTINE W REFLEX MICROSCOPIC
Bilirubin Urine: NEGATIVE
Glucose, UA: NEGATIVE mg/dL
Hgb urine dipstick: NEGATIVE
Ketones, ur: NEGATIVE mg/dL
Leukocytes,Ua: NEGATIVE
Nitrite: NEGATIVE
Protein, ur: NEGATIVE mg/dL
Specific Gravity, Urine: 1.017 (ref 1.005–1.030)
pH: 6 (ref 5.0–8.0)

## 2022-08-31 LAB — BASIC METABOLIC PANEL
Anion gap: 6 (ref 5–15)
BUN: 17 mg/dL (ref 6–20)
CO2: 27 mmol/L (ref 22–32)
Calcium: 9.3 mg/dL (ref 8.9–10.3)
Chloride: 106 mmol/L (ref 98–111)
Creatinine, Ser: 0.84 mg/dL (ref 0.44–1.00)
GFR, Estimated: >60 mL/min (ref >60)
Glucose, Bld: 100 mg/dL — ABNORMAL HIGH (ref 70–99)
Potassium: 3.6 mmol/L (ref 3.5–5.1)
Sodium: 139 mmol/L (ref 135–145)

## 2022-08-31 LAB — HEPATIC FUNCTION PANEL
ALT: 72 U/L — ABNORMAL HIGH (ref 0–44)
AST: 70 U/L — ABNORMAL HIGH (ref 15–41)
Albumin: 3.9 g/dL (ref 3.5–5.0)
Alkaline Phosphatase: 85 U/L (ref 38–126)
Bilirubin, Direct: 0.1 mg/dL (ref 0.0–0.2)
Indirect Bilirubin: 0.4 mg/dL (ref 0.3–0.9)
Total Bilirubin: 0.5 mg/dL (ref 0.3–1.2)
Total Protein: 7.3 g/dL (ref 6.5–8.1)

## 2022-08-31 LAB — CBC
HCT: 37.4 % (ref 36.0–46.0)
Hemoglobin: 12.7 g/dL (ref 12.0–15.0)
MCH: 31.5 pg (ref 26.0–34.0)
MCHC: 34 g/dL (ref 30.0–36.0)
MCV: 92.8 fL (ref 80.0–100.0)
Platelets: 269 10*3/uL (ref 150–400)
RBC: 4.03 MIL/uL (ref 3.87–5.11)
RDW: 11.9 % (ref 11.5–15.5)
WBC: 7.6 10*3/uL (ref 4.0–10.5)
nRBC: 0 % (ref 0.0–0.2)

## 2022-08-31 LAB — POC URINE PREG, ED: Preg Test, Ur: NEGATIVE

## 2022-08-31 LAB — LIPASE, BLOOD: Lipase: 29 U/L (ref 11–51)

## 2022-08-31 MED ORDER — SODIUM CHLORIDE 0.9 % IV BOLUS: 1000.0000 mL | Freq: Once | INTRAVENOUS | Status: DC

## 2022-08-31 MED ORDER — KETOROLAC TROMETHAMINE 15 MG/ML IJ SOLN: 15.0000 mg | Freq: Once | INTRAMUSCULAR | Status: DC

## 2022-08-31 MED ORDER — KETOROLAC TROMETHAMINE 15 MG/ML IJ SOLN
15.0000 mg | Freq: Once | INTRAMUSCULAR | Status: DC | PRN
  Filled 2022-08-31: qty 1

## 2022-08-31 NOTE — Discharge Instructions (Signed)
Take acetaminophen 650 mg  every 6 hours for pain.  Take with food.  Call your urologist for a follow up appointment.   Thank you for choosing [place] for your health care today!  Please see your primary doctor this week for a follow up appointment.   If you do not have a primary doctor call the following clinics to establish care:  If you have insurance:  [facility] [phone] [address] [place] [place] [address]   [facility]  [phone] [address] [place] [place] [address]   If you do not have insurance:  [facility]  [phone] [address] [place] [address]  Sometimes, in the early stages of certain disease courses it is difficult to detect in the emergency department evaluation -- so, it is important that you continue to monitor your symptoms and call your doctor right away or return to the emergency department if you develop any new or worsening symptoms.  It was my pleasure to care for you today.   Daneil Dan Modesto Charon, MD

## 2022-08-31 NOTE — ED Triage Notes (Signed)
Pt comes with c/o left flank pain for few days. Pt states pain has gotten worse and has continued. Pt states no N/V.

## 2022-08-31 NOTE — ED Provider Notes (Addendum)
[facility] Provider Note    Event Date/Time   First MD Initiated Contact with Patient 08/31/22 [phone]     (approximate)   History   Flank Pain   HPI  Lydia Kennedy is a 31 y.o. female   Past medical history of CKD, patient states a history of reflux and scarring as a child with recurrent urinary tract infection who follows with urologist, anxiety, who presents with left flank pain since yesterday constant and feels similar to her pyelonephritis that she has had in the past.  Has no history of kidney stones.    She also has dysuria, frequency, hematuria.  No fevers or chills.  No vaginal discharge.  History was obtained via the patient and a review of external medical notes including urology note from August 2023 noting recurrent UTIs and multidrug-resistant E. coli      Physical Exam   Triage Vital Signs: ED Triage Vitals [08/31/22 0948]  Enc Vitals Group     BP      Pulse      Resp      Temp      Temp src      SpO2      Weight      Height      Head Circumference      Peak Flow      Pain Score 9     Pain Loc      Pain Edu?      Excl. in GC?     Most recent vital signs: Vitals:   08/31/22 1000 08/31/22 1255  BP: 123/82 118/80  Pulse: 97 88  Resp:  17  Temp:  98 F (36.7 C)  SpO2: 100% 99%    General: Awake, no distress.  CV:  Good peripheral perfusion.  Resp:  Normal effort.  Abd:  No distention. +L CVA tenderness, remainder of abdominal exam is benign, nontender to deep palpation in all quadrants without rigidity or guarding Other:  Overall well-appearing, nontoxic-appearing with normal hemodynamics no fever   ED Results / Procedures / Treatments   Labs (all labs ordered are listed, but only abnormal results are displayed) Labs Reviewed  URINALYSIS, ROUTINE W REFLEX MICROSCOPIC - Abnormal; Notable for the following components:      Result Value   Color, Urine YELLOW (*)    APPearance CLEAR (*)    All other  components within normal limits  BASIC METABOLIC PANEL - Abnormal; Notable for the following components:   Glucose, Bld 100 (*)    All other components within normal limits  HEPATIC FUNCTION PANEL - Abnormal; Notable for the following components:   AST 70 (*)    ALT 72 (*)    All other components within normal limits  CBC  LIPASE, BLOOD  POC URINE PREG, ED     I reviewed labs and they are notable for a urinalysis which is negative for nitrates, leukocytes   RADIOLOGY I independently reviewed and interpreted CT scan of the abdomen pelvis see no obvious renal stone.   PROCEDURES:  Critical Care performed: No  Procedures   MEDICATIONS ORDERED IN ED: Medications  sodium chloride 0.9 % bolus 1,000 mL (1,000 mLs Intravenous Not Given 08/31/22 1229)  ketorolac (TORADOL) 15 MG/ML injection 15 mg (has no administration in time range)    IMPRESSION / MDM / ASSESSMENT AND PLAN / ED COURSE  I reviewed the triage vital signs and the nursing notes.  Differential diagnosis includes, but is not limited to, nephrolithiasis, urinary tract infection/pyelonephritis, considered but less likely other intra-abdominal pathology like diverticulitis, ovarian torsion, appendicitis, cholecystitis, obstructions.  For urinalysis, pregnancy test, CT scan renal stone protocol (she is very hesitant to receive IV contrast and I think since renal stone is high likelihood noncontrast enhanced renal stone protocol is appropriate at this time.)  Blood tests including CBC, BMP, LFTs, lipase.  IV medications for pain including Toradol, IV fluids.  MDM: Work-up as above was largely unrevealing, pain well controlled with medications, patient is comfortable.  Labs unremarkable and UA not consistent with urinary tract infection and CT of the abdomen pelvis showed no signs of renal stone or obstructive pathology that is acute.  I have very low clinical suspicion for other intra-abdominal or  intra pelvic pathologies given a very benign abdominal exam.  Reexamined the patient after work-up as above and continues to have a benign abdominal exam with no tenderness to palpation in all quadrants.  There are STI, but patient denies risk factors.  Offered pelvic exam, STI testing but patient declines at this time.  Engaged in shared decision making with this patient and her spouse who is at bedside, regarding admission and observation for serial abdominal exams but given that the patient is well now pain well controlled and negative work-up as above she and I both agree that discharge home at this time with urology follow-up as scheduled would be appropriate.  Return precautions given.  Patient's presentation is most consistent with acute presentation with potential threat to life or bodily function.       FINAL CLINICAL IMPRESSION(S) / ED DIAGNOSES   Final diagnoses:  Left flank pain     Rx / DC Orders   ED Discharge Orders     None        Note:  This document was prepared using Dragon voice recognition software and may include unintentional dictation errors.    Pilar Jarvis, MD 08/31/22 1531    Pilar Jarvis, MD 08/31/22 [phone]

## 2022-09-03 ENCOUNTER — Ambulatory Visit: Admission: RE | Admit: 2022-09-03 | Payer: Medicaid Other | Source: Ambulatory Visit

## 2022-09-20 ENCOUNTER — Ambulatory Visit: Payer: Medicaid Other | Admitting: Urology

## 2022-10-05 ENCOUNTER — Ambulatory Visit (INDEPENDENT_AMBULATORY_CARE_PROVIDER_SITE_OTHER): Payer: Medicaid Other

## 2022-10-05 VITALS — BP 115/78 | HR 105 | Resp 16 | Ht 65.0 in | Wt 144.5 lb

## 2022-10-05 DIAGNOSIS — Z3201 Encounter for pregnancy test, result positive: Secondary | ICD-10-CM

## 2022-10-05 DIAGNOSIS — N912 Amenorrhea, unspecified: Secondary | ICD-10-CM

## 2022-10-05 LAB — POCT URINE PREGNANCY: Preg Test, Ur: POSITIVE — AB

## 2022-10-05 NOTE — Progress Notes (Signed)
Subjective:    Lydia Kennedy is a 31 y.o. female who presents for evaluation of amenorrhea. She believes she could be pregnant. Pregnancy is desired.  Last period was abnormal, patient reports cycle was shorter than normal.   Patient's last menstrual period was 08/02/2022 (exact date). The following portions of the patient's history were reviewed and updated as appropriate: allergies, current medications, past family history, past medical history, past social history, past surgical history, and problem list.  Vitals:   10/05/22 0838  BP: 115/78  Pulse: (!) 105  Resp: 16    Lab Review Urine HCG: positive    Assessment:    Absence of menstruation.     Plan:    Pregnancy Test:  Positive: EDC: 05/09/2023. Briefly discussed positive results and sent to check out for scheduling for New OB appointments.   Nunzio Cory. W, NCMA

## 2022-10-05 NOTE — Patient Instructions (Signed)

## 2022-10-08 ENCOUNTER — Telehealth: Payer: Self-pay

## 2022-10-08 NOTE — Telephone Encounter (Signed)
Pt called triage requesting an answer on an abnormal UPT done last Friday. I explained to her abnormal UPT is because it was positive, if it would've been negative it would've been normal.

## 2022-10-08 NOTE — Progress Notes (Signed)
New OB Intake  I connected with  Lydia Kennedy on 10/08/22 at  8:15 AM EDT by telephone via phone and verified that I am speaking with the correct person using two identifiers. Nurse is located at [employer] and pt is located at home.  I discussed the limitations, risks, security and privacy concerns of performing an evaluation and management service by telephone and the availability of in person appointments. I also discussed with the patient that there may be a patient responsible charge related to this service. The patient expressed understanding and agreed to proceed.  I explained I am completing New OB Intake today. We discussed her EDD of 05/09/2023 that is based on LMP of 08/02/2022 bust she is not completely sure of LMP.Ordered Dating ultra sound. Pt is G2/P1. I reviewed her allergies, medications, Medical/Surgical/OB history, and appropriate screenings. Based on history, this is a/an pregnancy uncomplicated .   Patient Active Problem List   Diagnosis Date Noted   Other somatoform disorders    Gastroesophageal reflux disease without esophagitis    Acute gastritis without hemorrhage    Generalized anxiety disorder 05/04/2016   Acute upper respiratory infection 05/04/2016   Low TSH level 05/04/2016   Low back pain 05/04/2016    Concerns addressed today NONE  Delivery Plans:  Plans to deliver at [facility]  Anatomy US NEEDS Anatomy US scheduled Due to unsure LMP [place] 21 genetic screening with patient. Patient agrees to genetic testing to be drawn at new OB visit. Discussed possible labs to be drawn at new OB appointment.  COVID Vaccine Patient has not had COVID vaccine.   Social Determinants of Health Food Insecurity: denies food insecurity WIC Referral: Patient is interested in referral to [facility].  Transportation: Patient denies transportation needs. Childcare: Discussed no children allowed at ultrasound appointments.   First  visit review I reviewed new OB appt with pt. I explained she will have ob bloodwork and pap smear/pelvic exam if indicated  Landis Gandy, [facility] 10/08/2022  8:45 AM

## 2022-10-10 ENCOUNTER — Telehealth: Payer: Self-pay | Admitting: *Deleted

## 2022-10-10 NOTE — Telephone Encounter (Signed)
Calling to speak with patient about appt 10/11/2022. Per Dr. Bernardo Heater, the CT was normal and pt doesn't need to be seen.   Spoke with patient and advised results, pt is now 9 1/[redacted] week pregnant, pt would like to cancel and will follow-up with her OB/GYN. Appt canceled.

## 2022-10-11 ENCOUNTER — Ambulatory Visit: Payer: Medicaid Other | Admitting: Urology

## 2022-10-12 ENCOUNTER — Ambulatory Visit (INDEPENDENT_AMBULATORY_CARE_PROVIDER_SITE_OTHER): Payer: Medicaid Other

## 2022-10-12 ENCOUNTER — Telehealth: Payer: Self-pay

## 2022-10-12 VITALS — Ht 65.0 in

## 2022-10-12 DIAGNOSIS — Z3689 Encounter for other specified antenatal screening: Secondary | ICD-10-CM

## 2022-10-12 DIAGNOSIS — O0990 Supervision of high risk pregnancy, unspecified, unspecified trimester: Secondary | ICD-10-CM

## 2022-10-12 DIAGNOSIS — Z3480 Encounter for supervision of other normal pregnancy, unspecified trimester: Secondary | ICD-10-CM

## 2022-10-12 DIAGNOSIS — Z1379 Encounter for other screening for genetic and chromosomal anomalies: Secondary | ICD-10-CM

## 2022-10-12 HISTORY — DX: Supervision of high risk pregnancy, unspecified, unspecified trimester: O09.90

## 2022-10-12 MED ORDER — PRENATAL PLUS VITAMIN/MINERAL 27-1 MG PO TABS: 1.0000 | ORAL_TABLET | ORAL | 4 refills | Status: AC

## 2022-10-12 MED ORDER — COMPLETENATE 29-1 MG PO CHEW: 1.0000 | CHEWABLE_TABLET | Freq: Every day | ORAL | Status: DC

## 2022-10-14 ENCOUNTER — Emergency Department
Admission: EM | Admit: 2022-10-14 | Discharge: 2022-10-14 | Disposition: A | Discharge status: Home / Self Care | Payer: Medicaid Other | Attending: Emergency Medicine | Admitting: Emergency Medicine

## 2022-10-14 ENCOUNTER — Other Ambulatory Visit: Payer: Self-pay

## 2022-10-14 ENCOUNTER — Emergency Department: Payer: Medicaid Other

## 2022-10-14 DIAGNOSIS — O99281 Endocrine, nutritional and metabolic diseases complicating pregnancy, first trimester: Secondary | ICD-10-CM | POA: Insufficient documentation

## 2022-10-14 DIAGNOSIS — Z3A12 12 weeks gestation of pregnancy: Secondary | ICD-10-CM | POA: Insufficient documentation

## 2022-10-14 DIAGNOSIS — N9489 Other specified conditions associated with female genital organs and menstrual cycle: Secondary | ICD-10-CM | POA: Insufficient documentation

## 2022-10-14 DIAGNOSIS — M545 Low back pain, unspecified: Secondary | ICD-10-CM

## 2022-10-14 DIAGNOSIS — O26891 Other specified pregnancy related conditions, first trimester: Secondary | ICD-10-CM | POA: Diagnosis not present

## 2022-10-14 DIAGNOSIS — Z3491 Encounter for supervision of normal pregnancy, unspecified, first trimester: Secondary | ICD-10-CM

## 2022-10-14 LAB — URINALYSIS, ROUTINE W REFLEX MICROSCOPIC
Bilirubin Urine: NEGATIVE
Glucose, UA: NEGATIVE mg/dL
Hgb urine dipstick: NEGATIVE
Ketones, ur: NEGATIVE mg/dL
Leukocytes,Ua: NEGATIVE
Nitrite: NEGATIVE
Protein, ur: NEGATIVE mg/dL
Specific Gravity, Urine: 1.02 (ref 1.005–1.030)
pH: 7 (ref 5.0–8.0)

## 2022-10-14 LAB — CBC WITH DIFFERENTIAL/PLATELET
Abs Immature Granulocytes: 0.03 10*3/uL (ref 0.00–0.07)
Basophils Absolute: 0 10*3/uL (ref 0.0–0.1)
Basophils Relative: 0 %
Eosinophils Absolute: 0.1 10*3/uL (ref 0.0–0.5)
Eosinophils Relative: 1 %
HCT: 34.1 % — ABNORMAL LOW (ref 36.0–46.0)
Hemoglobin: 11.7 g/dL — ABNORMAL LOW (ref 12.0–15.0)
Immature Granulocytes: 0 %
Lymphocytes Relative: 30 %
Lymphs Abs: 2.3 10*3/uL (ref 0.7–4.0)
MCH: 30.4 pg (ref 26.0–34.0)
MCHC: 34.3 g/dL (ref 30.0–36.0)
MCV: 88.6 fL (ref 80.0–100.0)
Monocytes Absolute: 0.6 10*3/uL (ref 0.1–1.0)
Monocytes Relative: 8 %
Neutro Abs: 4.6 10*3/uL (ref 1.7–7.7)
Neutrophils Relative %: 61 %
Platelets: 370 10*3/uL (ref 150–400)
RBC: 3.85 MIL/uL — ABNORMAL LOW (ref 3.87–5.11)
RDW: 11.2 % — ABNORMAL LOW (ref 11.5–15.5)
WBC: 7.8 10*3/uL (ref 4.0–10.5)
nRBC: 0 % (ref 0.0–0.2)

## 2022-10-14 LAB — COMPREHENSIVE METABOLIC PANEL
ALT: 34 U/L (ref 0–44)
AST: 22 U/L (ref 15–41)
Albumin: 3.7 g/dL (ref 3.5–5.0)
Alkaline Phosphatase: 116 U/L (ref 38–126)
Anion gap: 6 (ref 5–15)
BUN: 13 mg/dL (ref 6–20)
CO2: 26 mmol/L (ref 22–32)
Calcium: 9.1 mg/dL (ref 8.9–10.3)
Chloride: 103 mmol/L (ref 98–111)
Creatinine, Ser: 0.61 mg/dL (ref 0.44–1.00)
GFR, Estimated: >60 mL/min (ref >60)
Glucose, Bld: 101 mg/dL — ABNORMAL HIGH (ref 70–99)
Potassium: 3.3 mmol/L — ABNORMAL LOW (ref 3.5–5.1)
Sodium: 135 mmol/L (ref 135–145)
Total Bilirubin: 0.3 mg/dL (ref 0.3–1.2)
Total Protein: 7.4 g/dL (ref 6.5–8.1)

## 2022-10-14 LAB — HCG, QUANTITATIVE, PREGNANCY: hCG, Beta Chain, Quant, S: 95461 m[IU]/mL — ABNORMAL HIGH (ref <5)

## 2022-10-14 MED ORDER — LIDOCAINE 5 % EX PTCH: 1.0000 | MEDICATED_PATCH | Freq: Two times a day (BID) | CUTANEOUS | 0 refills | Status: DC

## 2022-10-14 MED ORDER — LIDOCAINE 5 % EX PTCH
1.0000 | MEDICATED_PATCH | CUTANEOUS | Status: DC
  Administered 2022-10-14: 1 via TRANSDERMAL
  Filled 2022-10-14: qty 1

## 2022-10-14 NOTE — ED Notes (Signed)
Pt is A&Ox4 with back pain since yesterday. Pt is [redacted] weeks pregnant. Pt appears to be in no distress.

## 2022-10-14 NOTE — ED Triage Notes (Signed)
Pt arrives with c/o lower back pain that started yesterday morning. Pt is currently [redacted] weeks pregnant. Pt denies n/v.

## 2022-10-14 NOTE — ED Provider Notes (Signed)
[facility] Provider Note    Event Date/Time   First MD Initiated Contact with Patient 10/14/22 0122     (approximate)   History   Back Pain   HPI  AVAH BASHOR is a 31 y.o. female who reports she is about 12-weeks based on her last menstrual cycle.  She presents tonight for evaluation of a cute onset left lower back pain "at the bottom of my spine" that started yesterday morning.  It has been persistent and she has not felt anything like it in the past.  Nothing in particular makes it better or worse except that she can make it worse by pressing on it.  She had no fall or trauma of which she is aware.  She reports having no prior history of back pain although she does have "low back pain" listed in her medical history in the computer.  She reports that she is establish care with [facility] but she has not yet had an ultrasound.  She states that she called over to the nurse line at [facility] and they told her she should come to the emergency department to make sure everything is okay "since I have not yet had an ultrasound".  She also reports that she has had issues with bladder and kidney infections in the past and has seen both a nephrologist and urologist.  However she has not had increased urinary frequency or foul-smelling urine recently.     Physical Exam   Triage Vital Signs: ED Triage Vitals [10/14/22 0036]  Enc Vitals Group     BP 131/81     Pulse Rate (!) 114     Resp 15     Temp 98.3 F (36.8 C)     Temp src      SpO2 100 %     Weight 65.3 kg (144 lb)     Height      Head Circumference      Peak Flow      Pain Score      Pain Loc      Pain Edu?      Excl. in GC?     Most recent vital signs: Vitals:   10/14/22 0036 10/14/22 0403  BP: 131/81 132/80  Pulse: (!) 114 89  Resp: 15 16  Temp: 98.3 F (36.8 C) 98.3 F (36.8 C)  SpO2: 100% 100%     General: Awake, generally well-appearing. CV:  Good peripheral  perfusion.  Mild tachycardia, regular rhythm. Resp:  Normal effort.  Abd:  No distention.  No palpation of the abdomen. Other:  No palpable deformities or visible deformities on the patient's lower back.  She has reproducible left lower.  Lumbar soft tissue tenderness to palpation.  No point tenderness on the lumbar spine.   ED Results / Procedures / Treatments   Labs (all labs ordered are listed, but only abnormal results are displayed) Labs Reviewed  CBC WITH DIFFERENTIAL/PLATELET - Abnormal; Notable for the following components:      Result Value   RBC 3.85 (*)    Hemoglobin 11.7 (*)    HCT 34.1 (*)    RDW 11.2 (*)    All other components within normal limits  COMPREHENSIVE METABOLIC PANEL - Abnormal; Notable for the following components:   Potassium 3.3 (*)    Glucose, Bld 101 (*)    All other components within normal limits  URINALYSIS, ROUTINE W REFLEX MICROSCOPIC - Abnormal; Notable for the following components:  Color, Urine YELLOW (*)    APPearance CLEAR (*)    All other components within normal limits  HCG, QUANTITATIVE, PREGNANCY - Abnormal; Notable for the following components:   hCG, Beta Chain, Quant, S 95,461 (*)    All other components within normal limits     RADIOLOGY See hospital course for details: Single intrauterine pregnancy at 9 weeks and 1 day gestation, no emergent findings.    PROCEDURES:  Critical Care performed: No  Procedures   MEDICATIONS ORDERED IN ED: Medications  lidocaine (LIDODERM) 5 % 1 patch (1 patch Transdermal Patch Applied 10/14/22 0322)     IMPRESSION / MDM / ASSESSMENT AND PLAN / ED COURSE  I reviewed the triage vital signs and the nursing notes.                              Differential diagnosis includes, but is not limited to, musculoskeletal strain, pregnancy related pain, UTI/pyelonephritis, ectopic pregnancy.  Patient's presentation is most consistent with acute presentation with potential threat to life or  bodily function.  Vital signs are normal other than tachycardia which is likely due to both her pregnancy and her degree of anxiety over her current discomfort.  Labs/studies ordered: CMP, hCG, urinalysis, CBC with differential.  Beta-hCG is about 95,000 which is appropriate for estimated gestational age of [redacted] weeks.  Rest of her labs are all essentially normal other than very mild hypokalemia which is not clinically relevant.  She has no leukocytosis and essentially normal hemoglobin.  She also has no evidence of urinary tract infection which is reassuring.  The patient's physical exam is strongly suggestive of nonspecific musculoskeletal strain in her left lower lumbar region but without any point tenderness of the spine itself.  There is no indication for CT or x-rays.  The patient mentioned twice that the nurse line for [facility] recommended she come in for an ultrasound or because she has not yet had an ultrasound.  I explained I think it is very unlikely that an ultrasound is necessary tonight but given that she has not had one we can obtain 1 to rule out ectopic pregnancy which could theoretically cause pain.  She is comfortable with this plan.  She has not had any vaginal bleeding and there is no indication to check ABO/Rh.  I anticipate discharge and outpatient follow-up after reassuring ultrasound.  Given that she is pregnant and very concerned about any medication side effects, I have not ordered anything other than a Lidoderm patch which should be well-tolerated and may provide some relief.  She is comfortable with this plan.   Clinical Course as of 10/14/22 0410  Sun Oct 14, 2022  0349 US OB LESS THAN 14 WEEKS WITH OB TRANSVAGINAL I viewed and interpreted the patient's ultrasound.  I see fetal cardiac activity and no obvious acute abnormalities.  Radiology confirms a gestational age of about 23 weeks and 1 day with visible cardiac activity and no acute or emergent abnormality.  I updated  the patient.  We went over back pain in pregnancy recommendations and follow-up recommendations and return precautions.  She understands and agrees with the plan.  No indication for admission or emergent OB/GYN consult at this time.  No neurological deficits to suggest emergent cause of low back pain. [CF]    Clinical Course User Index [CF] Hinda Kehr, MD     FINAL CLINICAL IMPRESSION(S) / ED DIAGNOSES   Final diagnoses:  Acute  left-sided low back pain without sciatica  First trimester pregnancy     Rx / DC Orders   ED Discharge Orders          Ordered    lidocaine (LIDODERM) 5 %  Every 12 hours        10/14/22 0349             Note:  This document was prepared using Dragon voice recognition software and may include unintentional dictation errors.   Loleta Rose, MD 10/14/22 [phone]

## 2022-10-18 ENCOUNTER — Ambulatory Visit: Payer: Medicaid Other

## 2022-10-19 ENCOUNTER — Encounter: Payer: Self-pay | Admitting: Obstetrics

## 2022-10-19 ENCOUNTER — Other Ambulatory Visit: Payer: Medicaid Other

## 2022-10-19 DIAGNOSIS — Z3480 Encounter for supervision of other normal pregnancy, unspecified trimester: Secondary | ICD-10-CM

## 2022-10-19 DIAGNOSIS — Z3A11 11 weeks gestation of pregnancy: Secondary | ICD-10-CM

## 2022-10-19 DIAGNOSIS — Z1379 Encounter for other screening for genetic and chromosomal anomalies: Secondary | ICD-10-CM

## 2022-10-20 LAB — CBC/D/PLT+RPR+RH+ABO+RUBIGG...
Antibody Screen: NEGATIVE
Basophils Absolute: 0 10*3/uL (ref 0.0–0.2)
Basos: 1 %
EOS (ABSOLUTE): 0 10*3/uL (ref 0.0–0.4)
Eos: 1 %
HCV Ab: NONREACTIVE
HIV Screen 4th Generation wRfx: NONREACTIVE
Hematocrit: 33.4 % — ABNORMAL LOW (ref 34.0–46.6)
Hemoglobin: 11.2 g/dL (ref 11.1–15.9)
Hepatitis B Surface Ag: NEGATIVE
Immature Grans (Abs): 0 10*3/uL (ref 0.0–0.1)
Immature Granulocytes: 0 %
Lymphocytes Absolute: 1.5 10*3/uL (ref 0.7–3.1)
Lymphs: 32 %
MCH: 29.9 pg (ref 26.6–33.0)
MCHC: 33.5 g/dL (ref 31.5–35.7)
MCV: 89 fL (ref 79–97)
Monocytes Absolute: 0.3 10*3/uL (ref 0.1–0.9)
Monocytes: 7 %
Neutrophils Absolute: 2.8 10*3/uL (ref 1.4–7.0)
Neutrophils: 59 %
Platelets: 304 10*3/uL (ref 150–450)
RBC: 3.74 x10E6/uL — ABNORMAL LOW (ref 3.77–5.28)
RDW: 11.8 % (ref 11.7–15.4)
RPR Ser Ql: NONREACTIVE
Rh Factor: POSITIVE
Rubella Antibodies, IGG: 5.26 index (ref >0.99)
Varicella zoster IgG: 1822 index (ref >165)
WBC: 4.8 10*3/uL (ref 3.4–10.8)

## 2022-10-20 LAB — URINALYSIS, ROUTINE W REFLEX MICROSCOPIC
Bilirubin, UA: NEGATIVE
Glucose, UA: NEGATIVE
Ketones, UA: NEGATIVE
Nitrite, UA: NEGATIVE
Protein,UA: NEGATIVE
RBC, UA: NEGATIVE
Specific Gravity, UA: 1.022 (ref 1.005–1.030)
Urobilinogen, Ur: 0.2 mg/dL (ref 0.2–1.0)
pH, UA: 6 (ref 5.0–7.5)

## 2022-10-20 LAB — MICROSCOPIC EXAMINATION: Casts: NONE SEEN /lpf

## 2022-10-20 LAB — HCV INTERPRETATION

## 2022-10-21 LAB — CULTURE, OB URINE

## 2022-10-21 LAB — URINE CULTURE, OB REFLEX

## 2022-10-22 ENCOUNTER — Telehealth: Payer: Self-pay

## 2022-10-24 LAB — MATERNIT 21 PLUS CORE, BLOOD
Fetal Fraction: 4
Result (T21): NEGATIVE
Trisomy 13 (Patau syndrome): NEGATIVE
Trisomy 18 (Edwards syndrome): NEGATIVE
Trisomy 21 (Down syndrome): NEGATIVE

## 2022-10-25 LAB — OXYCODONE/OXYMORPHONE CONFIRM
OXYCODONE/OXYMORPH: POSITIVE — AB
OXYCODONE: >3000 ng/mL
OXYCODONE: POSITIVE — AB
OXYMORPHONE CONFIRM: 2183 ng/mL
OXYMORPHONE: POSITIVE — AB

## 2022-10-25 LAB — AMPHETAMINE (GC/MS), URINE
AMPHETAMINE GC/MS CONF: >3000 ng/mL
Amphetamine: POSITIVE — AB
Amphetamines: POSITIVE — AB
Methamphetamine: NEGATIVE

## 2022-10-25 LAB — MONITOR DRUG PROFILE 14(MW)
BARBITURATE SCREEN URINE: NEGATIVE ng/mL
Buprenorphine, Urine: NEGATIVE ng/mL
CANNABINOIDS UR QL SCN: NEGATIVE ng/mL
Cocaine (Metab) Scrn, Ur: NEGATIVE ng/mL
Creatinine(Crt), U: 164.9 mg/dL (ref 20.0–300.0)
Fentanyl, Urine: NEGATIVE pg/mL
Meperidine Screen, Urine: NEGATIVE ng/mL
Methadone Screen, Urine: NEGATIVE ng/mL
Ph of Urine: 6 (ref 4.5–8.9)
Phencyclidine Qn, Ur: NEGATIVE ng/mL
Propoxyphene Scrn, Ur: NEGATIVE ng/mL
SPECIFIC GRAVITY: 1.016
Tramadol Screen, Urine: NEGATIVE ng/mL

## 2022-10-25 LAB — BENZODIAZEPINES CONFIRM, URINE
ALPRAZOLAM CONFIRM: 2501 ng/mL
ALPRAZOLAM: POSITIVE — AB
BENZODIAZEPINES: POSITIVE ng/mL — AB
CLONAZEPAM: NEGATIVE
Flurazepam: NEGATIVE
LORAZEPAM: NEGATIVE
MIDAZOLAM: NEGATIVE
NORDIAZEPAM: NEGATIVE
Oxazepam: NEGATIVE
TEMAZEPAM: NEGATIVE
TRIAZOLAM: NEGATIVE

## 2022-10-25 LAB — OPIATES CONFIRMATION, URINE: Opiates: NEGATIVE ng/mL

## 2022-10-25 LAB — NICOTINE SCREEN, URINE: Cotinine Ql Scrn, Ur: POSITIVE ng/mL — AB

## 2022-10-26 ENCOUNTER — Encounter: Payer: Medicaid Other | Admitting: Obstetrics

## 2022-10-26 ENCOUNTER — Encounter: Payer: Self-pay | Admitting: Obstetrics

## 2022-10-26 NOTE — Telephone Encounter (Signed)
noted 

## 2022-10-30 ENCOUNTER — Encounter: Payer: Medicaid Other | Admitting: Certified Nurse Midwife

## 2022-11-04 ENCOUNTER — Encounter: Payer: Self-pay | Admitting: Obstetrics and Gynecology

## 2022-11-04 DIAGNOSIS — O34219 Maternal care for unspecified type scar from previous cesarean delivery: Secondary | ICD-10-CM

## 2022-11-09 ENCOUNTER — Ambulatory Visit: Payer: Medicaid Other

## 2022-11-22 ENCOUNTER — Ambulatory Visit (INDEPENDENT_AMBULATORY_CARE_PROVIDER_SITE_OTHER): Payer: Medicaid Other | Admitting: Obstetrics and Gynecology

## 2022-11-22 ENCOUNTER — Encounter: Payer: Self-pay | Admitting: Obstetrics and Gynecology

## 2022-11-22 VITALS — BP 113/75 | HR 96 | Ht 65.0 in | Wt 144.6 lb

## 2022-11-22 DIAGNOSIS — O34219 Maternal care for unspecified type scar from previous cesarean delivery: Secondary | ICD-10-CM | POA: Diagnosis not present

## 2022-11-22 DIAGNOSIS — Z87448 Personal history of other diseases of urinary system: Secondary | ICD-10-CM

## 2022-11-22 DIAGNOSIS — Z8639 Personal history of other endocrine, nutritional and metabolic disease: Secondary | ICD-10-CM

## 2022-11-22 DIAGNOSIS — Z1379 Encounter for other screening for genetic and chromosomal anomalies: Secondary | ICD-10-CM

## 2022-11-22 DIAGNOSIS — O99341 Other mental disorders complicating pregnancy, first trimester: Secondary | ICD-10-CM

## 2022-11-22 DIAGNOSIS — O09291 Supervision of pregnancy with other poor reproductive or obstetric history, first trimester: Secondary | ICD-10-CM | POA: Diagnosis not present

## 2022-11-22 DIAGNOSIS — F411 Generalized anxiety disorder: Secondary | ICD-10-CM | POA: Diagnosis not present

## 2022-11-22 DIAGNOSIS — Z3A16 16 weeks gestation of pregnancy: Secondary | ICD-10-CM

## 2022-11-22 DIAGNOSIS — O0992 Supervision of high risk pregnancy, unspecified, second trimester: Secondary | ICD-10-CM

## 2022-11-22 DIAGNOSIS — Z3A14 14 weeks gestation of pregnancy: Secondary | ICD-10-CM

## 2022-11-22 DIAGNOSIS — Z3480 Encounter for supervision of other normal pregnancy, unspecified trimester: Secondary | ICD-10-CM

## 2022-11-22 LAB — POCT URINALYSIS DIPSTICK OB
Bilirubin, UA: NEGATIVE
Blood, UA: NEGATIVE
Glucose, UA: NEGATIVE
Ketones, UA: NEGATIVE
Leukocytes, UA: NEGATIVE
Nitrite, UA: NEGATIVE
POC,PROTEIN,UA: NEGATIVE
Spec Grav, UA: 1.015 (ref 1.010–1.025)
Urobilinogen, UA: 0.2 E.U./dL
pH, UA: 6.5 (ref 5.0–8.0)

## 2022-11-22 NOTE — Progress Notes (Signed)
New OB Physical: [redacted]w[redacted]d: Patient is doing well. She has no new concerns. AFP ordered. Medicaid forms given to patient to complete.

## 2022-11-22 NOTE — Progress Notes (Addendum)
OBSTETRIC INITIAL PRENATAL VISIT  Subjective:    Lydia Kennedy is being seen today for her first obstetrical visit.  This is a planned pregnancy. She is a 31 y.o. G2P1001 female at [redacted]w[redacted]d gestation, Estimated Date of Delivery: 05/09/23 with Patient's last menstrual period was 08/02/2022 (exact date).,  inconsistent with 9  week sono. Her obstetrical history is significant for  GAD, kidney disease (h/o reflux) with interstitial cystitis, and h/o prior C-section x 1 (for arrest of descent) . Relationship with FOB: significant other, living together. Patient considering breast feeding. Pregnancy history fully reviewed.    OB History  Gravida Para Term Preterm AB Living  2 1 1  0 0 1  SAB IAB Ectopic Multiple Live Births  0 0 0 0 1    # Outcome Date GA Lbr Len/2nd Weight Sex Delivery Anes PTL Lv  2 Current           1 Term 07/12/11   7 lb 15 oz (3.6 kg) F CS-LTranv   LIV     Complications: Failure to Progress in Second Stage     Name: zayli    Gynecologic History:  Last pap smear was 07/06/2020.  Results were Normal.  Denies h/o abnormal pap smears in the past.  Denies history of STIs.  Contraception prior to conception: None   Past Medical History:  Diagnosis Date   ADHD    Anemia    Anxiety    Chronic kidney disease    kidney reflux, problem more as child/ recuurent interstitial cystitis   Family history of breast cancer    7/21 cancer genetic testing letter sent   GERD (gastroesophageal reflux disease)    Headache    daily   IC (interstitial cystitis)    Infection    UTI   Thyroid dysfunction    Tonsillitis     Family History  Problem Relation Age of Onset   Breast cancer Mother 43   Hypertension Father    Pancreatic cancer Maternal Grandmother 18    Past Surgical History:  Procedure Laterality Date   BREAST SURGERY     augmentation   CESAREAN SECTION     ESOPHAGOGASTRODUODENOSCOPY (EGD) WITH PROPOFOL N/A 07/07/2018   Procedure:  ESOPHAGOGASTRODUODENOSCOPY (EGD) WITH PROPOFOL;  Surgeon: Lucilla Lame, MD;  Location: [facility];  Service: Endoscopy;  Laterality: N/A;   TONSILLECTOMY N/A 01/29/2018   Procedure: TONSILLECTOMY;  Surgeon: Carloyn Manner, MD;  Location: [facility];  Service: ENT;  Laterality: N/A;  UPREG    Social History   Socioeconomic History   Marital status: Significant Other    Spouse name: Markie   Number of children: 1   Years of education: Not on file   Highest education level: Associate degree: academic program  Occupational History   Not on file  Tobacco Use   Smoking status: Former    Packs/day: 0.25    Types: Cigarettes    Quit date: 01/2018    Years since quitting: 4.8   Smokeless tobacco: Never  Vaping Use   Vaping Use: Former   Quit date: 01/12/2019  Substance and Sexual Activity   Alcohol use: Not Currently    Alcohol/week: 3.0 standard drinks of alcohol    Types: 3 Standard drinks or equivalent per week    Comment: occ   Drug use: No   Sexual activity: Yes    Birth control/protection: None  Other Topics Concern   Not on file  Social History Narrative   Not on  file   Social Determinants of Health   Financial Resource Strain: Medium Risk (10/12/2022)   Overall Financial Resource Strain (CARDIA)    Difficulty of Paying Living Expenses: Somewhat hard  Food Insecurity: No Food Insecurity (10/12/2022)   Hunger Vital Sign    Worried About Running Out of Food in the Last Year: Never true    Ran Out of Food in the Last Year: Never true  Transportation Needs: No Transportation Needs (10/12/2022)   PRAPARE - Hydrologist (Medical): No    Lack of Transportation (Non-Medical): No  Physical Activity: Insufficiently Active (10/12/2022)   Exercise Vital Sign    Days of Exercise per Week: 2 days    Minutes of Exercise per Session: 30 min  Stress: Stress Concern Present (10/12/2022)   [place]    Feeling of Stress : To some extent  Social Connections: Moderately Integrated (10/12/2022)   Social Connection and Isolation Panel [NHANES]    Frequency of Communication with Friends and Family: More than three times a week    Frequency of Social Gatherings with Friends and Family: More than three times a week    Attends Religious Services: 1 to 4 times per year    Active Member of [employer] or Organizations: No    Attends Archivist Meetings: Never    Marital Status: Living with partner  Intimate Partner Violence: Not At Risk (10/12/2022)   Humiliation, Afraid, Rape, and Kick questionnaire    Fear of Current or Ex-Partner: No    Emotionally Abused: No    Physically Abused: No    Sexually Abused: No    Current Outpatient Medications on File Prior to Visit  Medication Sig Dispense Refill   ALPRAZolam (XANAX) 0.5 MG tablet Take 2 mg by mouth 3 (three) times daily as needed for anxiety.      amphetamine-dextroamphetamine (ADDERALL) 15 MG tablet Take 30 mg by mouth 3 (three) times daily. Only takes while in school     Cholecalciferol (VITAMIN D3 PO) Take by mouth daily.     OMEPRAZOLE PO Take 40 mg by mouth daily. am     No current facility-administered medications on file prior to visit.    Allergies  Allergen Reactions   Cephalexin Anaphylaxis   Elemental Sulfur Shortness Of Breath    Other reaction(s): Muscle Pain   Penicillins Anaphylaxis and Shortness Of Breath    Throat swelling]    Sulfa Antibiotics      Review of Systems General: Not Present- Fever, Weight Loss and Weight Gain. Skin: Not Present- Rash. HEENT: Not Present- Blurred Vision, Headache and Bleeding Gums. Respiratory: Not Present- Difficulty Breathing. Breast: Not Present- Breast Mass. Cardiovascular: Not Present- Chest Pain, Elevated Blood Pressure, Fainting / Blacking Out and Shortness of Breath. Gastrointestinal: Not Present- Abdominal Pain, Constipation,  Nausea and Vomiting. Female Genitourinary: Not Present- Frequency, Painful Urination, Pelvic Pain, Vaginal Bleeding, Vaginal Discharge, Contractions, regular, Fetal Movements Decreased, Urinary Complaints and Vaginal Fluid. Musculoskeletal: Not Present- Back Pain and Leg Cramps. Neurological: Not Present- Dizziness. Psychiatric: Not Present- Depression.     Objective:   Blood pressure 113/75, pulse 96, weight 144 lb 9.6 oz (65.6 kg), last menstrual period 08/02/2022.   Body mass index is 24.06 kg/m.  General Appearance:    Alert, cooperative, no distress, appears stated age  Head:    Normocephalic, without obvious abnormality, atraumatic  Eyes:    PERRL, conjunctiva/corneas clear, EOM's intact, both eyes  Ears:    Normal external ear canals, both ears  Nose:   Nares normal, septum midline, mucosa normal, no drainage or sinus tenderness  Throat:   Lips, mucosa, and tongue normal; teeth and gums normal  Neck:   Supple, symmetrical, trachea midline, no adenopathy; thyroid: no enlargement/tenderness/nodules; no carotid bruit or JVD  Back:     Symmetric, no curvature, ROM normal, no CVA tenderness  Lungs:     Clear to auscultation bilaterally, respirations unlabored  Chest Wall:    No tenderness or deformity   Heart:    Regular rate and rhythm, S1 and S2 normal, no murmur, rub or gallop  Breast Exam:    No tenderness, masses, or nipple abnormality  Abdomen:     Soft, non-tender, bowel sounds active all four quadrants, no masses, no organomegaly.  FHT 160  bpm.  Genitalia:    Pelvic:external genitalia normal, vagina without lesions, discharge, or tenderness, rectovaginal septum  normal. Cervix normal in appearance, no cervical motion tenderness, no adnexal masses or tenderness.  Pregnancy positive findings: uterine enlargement: 15 wk size, nontender.   Rectal:    Normal external sphincter.  No hemorrhoids appreciated. Internal exam not done.   Extremities:   Extremities normal, atraumatic, no  cyanosis or edema  Pulses:   2+ and symmetric all extremities  Skin:   Skin color, texture, turgor normal, no rashes or lesions  Lymph nodes:   Cervical, supraclavicular, and axillary nodes normal  Neurologic:   CNII-XII intact, normal strength, sensation and reflexes throughout     Assessment:   1. Pregnancy, supervision, high-risk, second trimester   2. History of cesarean delivery affecting pregnancy   3. [redacted] weeks gestation of pregnancy   4. Generalized anxiety disorder   5. History of kidney problems   6. History of thyroid disorder     Plan:   Supervision of high risk pregnancy in second trimester - Initial labs reviewed. - Prenatal vitamins encouraged. - Problem list reviewed and updated. - New OB counseling:  The patient has been given an overview regarding routine prenatal care.  Recommendations regarding diet, weight gain, and exercise in pregnancy were given. - Prenatal testing, optional genetic testing, and ultrasound use in pregnancy were reviewed.  Traditional genetic screening vs cell-fee DNA genetic screening discussed, including risks and benefits. Testing results reviewed, normal [id number]. Will get AFP at next visit.  - Anatomy scan ordered for next visit.  - Benefits of Breast Feeding were discussed. The patient is encouraged to consider nursing her baby post partum. - The patient has Medicaid.  [facility] Medicaid Risk Screening Form completed today   2. History of cesarean delivery affecting pregnancy - Discussed desires for repeat C-section vs TOLAC.  Reviewed risks and benefits.  Patient desires TOLAC.  Noted patient's exam with prominent pubic arch.   3.  Generalized anxiety disorder - Currently taking Xanax and Adderall, notes that she is working to wean from the Adderall.  Is currently managed by her therapist. Notes she has discussed the pregnancy with them and they noted that both medications were ok to take as needed in pregnancy. I discussed classes of both  medications, advised on risks of withdrawal and transitional issues at birth due to mild respiratory depression or sedation from medications.  Patient notes understanding.   4. History of kidney problems - History of reflux and IC.  Discussed that patient is at increased risk for UTI's during pregnancy.  Will continue to monitor closely. Would recommend suppression therapy after first  UTI in pregnancy if it occurs. Recent normal Creatinine noted.   5.  History of thyroid disorder - Patient denies use of any medications at this time.  Needs thyroid lab next visit.    Follow up in 4 weeks.    Rubie Maid, MD [facility]

## 2022-11-23 ENCOUNTER — Encounter: Payer: Self-pay | Admitting: Obstetrics and Gynecology

## 2022-11-23 DIAGNOSIS — Z87448 Personal history of other diseases of urinary system: Secondary | ICD-10-CM | POA: Insufficient documentation

## 2022-11-23 NOTE — Patient Instructions (Signed)

## 2022-12-25 ENCOUNTER — Ambulatory Visit (INDEPENDENT_AMBULATORY_CARE_PROVIDER_SITE_OTHER): Payer: Medicaid Other | Admitting: Obstetrics and Gynecology

## 2022-12-25 ENCOUNTER — Ambulatory Visit: Payer: Medicaid Other

## 2022-12-25 VITALS — BP 100/64 | HR 86 | Wt 162.0 lb

## 2022-12-25 DIAGNOSIS — Z3A19 19 weeks gestation of pregnancy: Secondary | ICD-10-CM

## 2022-12-25 DIAGNOSIS — O0992 Supervision of high risk pregnancy, unspecified, second trimester: Secondary | ICD-10-CM

## 2022-12-25 LAB — POCT URINALYSIS DIPSTICK OB
Bilirubin, UA: NEGATIVE
Blood, UA: NEGATIVE
Glucose, UA: NEGATIVE
Ketones, UA: NEGATIVE
Leukocytes, UA: NEGATIVE
Nitrite, UA: NEGATIVE
POC,PROTEIN,UA: NEGATIVE
Spec Grav, UA: 1.015 (ref 1.010–1.025)
Urobilinogen, UA: 0.2 E.U./dL
pH, UA: 5 (ref 5.0–8.0)

## 2022-12-25 NOTE — Progress Notes (Signed)
ROB: Complains of bilateral irregular pelvic pain.  Question round ligament pain.  Urine negative    she missed her ultrasound appointment this morning will have to reschedule anatomy.  Reports fetal movement.

## 2022-12-28 ENCOUNTER — Ambulatory Visit
Admission: RE | Admit: 2022-12-28 | Discharge: 2022-12-28 | Disposition: A | Discharge status: Home / Self Care | Payer: Medicaid Other | Source: Ambulatory Visit | Attending: Obstetrics and Gynecology | Admitting: Obstetrics and Gynecology

## 2022-12-28 DIAGNOSIS — O0992 Supervision of high risk pregnancy, unspecified, second trimester: Secondary | ICD-10-CM | POA: Insufficient documentation

## 2023-01-22 ENCOUNTER — Ambulatory Visit (INDEPENDENT_AMBULATORY_CARE_PROVIDER_SITE_OTHER): Payer: Medicaid Other | Admitting: Licensed Practical Nurse

## 2023-01-22 ENCOUNTER — Encounter: Payer: Self-pay | Admitting: Licensed Practical Nurse

## 2023-01-22 VITALS — BP 119/69 | HR 89 | Wt 172.2 lb

## 2023-01-22 DIAGNOSIS — O0992 Supervision of high risk pregnancy, unspecified, second trimester: Secondary | ICD-10-CM

## 2023-01-22 DIAGNOSIS — Z131 Encounter for screening for diabetes mellitus: Secondary | ICD-10-CM

## 2023-01-22 DIAGNOSIS — Z3A23 23 weeks gestation of pregnancy: Secondary | ICD-10-CM

## 2023-01-22 DIAGNOSIS — I10 Essential (primary) hypertension: Secondary | ICD-10-CM

## 2023-01-22 DIAGNOSIS — Z3482 Encounter for supervision of other normal pregnancy, second trimester: Secondary | ICD-10-CM

## 2023-01-22 LAB — POCT URINALYSIS DIPSTICK OB
Bilirubin, UA: NEGATIVE
Blood, UA: NEGATIVE
Glucose, UA: NEGATIVE
Ketones, UA: NEGATIVE
Leukocytes, UA: NEGATIVE
Nitrite, UA: NEGATIVE
POC,PROTEIN,UA: NEGATIVE
Spec Grav, UA: 1.015 (ref 1.010–1.025)
Urobilinogen, UA: 0.2 E.U./dL
pH, UA: 5 (ref 5.0–8.0)

## 2023-01-22 MED ORDER — NIFEDIPINE ER OSMOTIC RELEASE 30 MG PO TB24: 30.0000 mg | ORAL_TABLET | Freq: Every day | ORAL | 2 refills | Status: DC

## 2023-01-22 NOTE — Progress Notes (Signed)
Routine Prenatal Care Visit  Subjective  Lydia Kennedy is a 32 y.o. G2P1001 at [redacted]w[redacted]d being seen today for ongoing prenatal care.  She is currently monitored for the following issues for this high-risk pregnancy and has Generalized anxiety disorder; Acute upper respiratory infection; Low TSH level; Low back pain; Other somatoform disorders; Gastroesophageal reflux disease without esophagitis; Acute gastritis without hemorrhage; Supervision of other normal pregnancy, antepartum; Essential hypertension; History of cesarean delivery affecting pregnancy; and History of kidney problems on their problem list.  ----------------------------------------------------------------------------------- Patient reports no complaints.  Feeling good.  Has seen some occasional swelling in her feet. Works as a Secondary school teacher, is on her feet sometimes for work. Has a 69 y/o at home.  Currently on Lisinopril for HTN, has not reviewed this with a OB provider. Reviewed with Dr Amalia Hailey. Will switch to Procardia XL 30mg  daily.  -reviewed GDM screening at next visit   Contractions: Not present. Vag. Bleeding: None.  Movement: Present. Leaking Fluid denies.  ----------------------------------------------------------------------------------- The following portions of the patient's history were reviewed and updated as appropriate: allergies, current medications, past family history, past medical history, past social history, past surgical history and problem list. Problem list updated.  Objective  Blood pressure 119/69, pulse 89, weight 172 lb 3.2 oz (78.1 kg), last menstrual period 08/02/2022. Pregravid weight 126 lb (57.2 kg) Total Weight Gain 46 lb 3.2 oz (21 kg) Urinalysis: Urine Protein Negative  Urine Glucose Negative  Fetal Status: Fetal Heart Rate (bpm): 140 Fundal Height: 22 cm Movement: Present     General:  Alert, oriented and cooperative. Patient is in no acute distress.  Skin: Skin is warm and dry. No rash  noted.   Cardiovascular: Normal heart rate noted  Respiratory: Normal respiratory effort, no problems with respiration noted  Abdomen: Soft, gravid, appropriate for gestational age. Pain/Pressure: Absent     Pelvic:  Cervical exam deferred        Extremities: Normal range of motion.  Edema: Trace  Mental Status: Normal mood and affect. Normal behavior. Normal judgment and thought content.   Assessment   32 y.o. G2P1001 at [redacted]w[redacted]d by  05/18/2023, by Ultrasound presenting for routine prenatal visit  Plan   G2 Problems (from 11/22/22 to present)     No problems associated with this episode.        Preterm labor symptoms and general obstetric precautions including but not limited to vaginal bleeding, contractions, leaking of fluid and fetal movement were reviewed in detail with the patient. Please refer to After Visit Summary for other counseling recommendations.   Return in about 1 week (around 01/29/2023) for BP check, ROB with 28 wk lab in 4 wks .   Roberto Scales, CNM  ,01/22/23  1:28 PM

## 2023-01-30 ENCOUNTER — Telehealth: Payer: Self-pay | Admitting: Licensed Practical Nurse

## 2023-01-30 ENCOUNTER — Encounter: Payer: Medicaid Other | Admitting: Licensed Practical Nurse

## 2023-01-30 DIAGNOSIS — Z3A24 24 weeks gestation of pregnancy: Secondary | ICD-10-CM

## 2023-01-30 DIAGNOSIS — O0992 Supervision of high risk pregnancy, unspecified, second trimester: Secondary | ICD-10-CM

## 2023-01-30 NOTE — Telephone Encounter (Signed)
Reached out to pt to reschedule ROB/BP check appt that was scheduled for 01/30/2023 with LMD at 1:55.  Mailbox was full, could not leave a message.

## 2023-01-31 ENCOUNTER — Encounter: Payer: Self-pay | Admitting: Licensed Practical Nurse

## 2023-01-31 NOTE — Telephone Encounter (Signed)
Reached out to pt to reschedule ROB/BP check appt that was scheduled for 01/30/2023 with LMD at 1:55.  Mailbox was full, could not leave a message.  Will send a MyChart letter.

## 2023-03-06 ENCOUNTER — Other Ambulatory Visit: Payer: Medicaid Other

## 2023-03-06 ENCOUNTER — Ambulatory Visit (INDEPENDENT_AMBULATORY_CARE_PROVIDER_SITE_OTHER): Payer: Medicaid Other | Admitting: Obstetrics & Gynecology

## 2023-03-06 VITALS — BP 120/80 | Wt 182.0 lb

## 2023-03-06 DIAGNOSIS — R7989 Other specified abnormal findings of blood chemistry: Secondary | ICD-10-CM | POA: Insufficient documentation

## 2023-03-06 DIAGNOSIS — O0992 Supervision of high risk pregnancy, unspecified, second trimester: Secondary | ICD-10-CM

## 2023-03-06 DIAGNOSIS — Z8639 Personal history of other endocrine, nutritional and metabolic disease: Secondary | ICD-10-CM

## 2023-03-06 DIAGNOSIS — O10013 Pre-existing essential hypertension complicating pregnancy, third trimester: Secondary | ICD-10-CM

## 2023-03-06 DIAGNOSIS — Z131 Encounter for screening for diabetes mellitus: Secondary | ICD-10-CM

## 2023-03-06 DIAGNOSIS — O34219 Maternal care for unspecified type scar from previous cesarean delivery: Secondary | ICD-10-CM

## 2023-03-06 DIAGNOSIS — O10919 Unspecified pre-existing hypertension complicating pregnancy, unspecified trimester: Secondary | ICD-10-CM | POA: Insufficient documentation

## 2023-03-06 DIAGNOSIS — Z3480 Encounter for supervision of other normal pregnancy, unspecified trimester: Secondary | ICD-10-CM

## 2023-03-06 DIAGNOSIS — Z3A29 29 weeks gestation of pregnancy: Secondary | ICD-10-CM

## 2023-03-06 DIAGNOSIS — O2603 Excessive weight gain in pregnancy, third trimester: Secondary | ICD-10-CM | POA: Insufficient documentation

## 2023-03-06 NOTE — Progress Notes (Signed)
   PRENATAL VISIT NOTE  Subjective:  Lydia Kennedy is a 32 y.o. G2P1001 at [redacted]w[redacted]d being seen today for ongoing prenatal care.  She is currently monitored for the following issues for this high-risk pregnancy and has Generalized anxiety disorder; Acute upper respiratory infection; Low TSH level; Low back pain; Other somatoform disorders; Gastroesophageal reflux disease without esophagitis; Acute gastritis without hemorrhage; Supervision of other normal pregnancy, antepartum; Essential hypertension; History of cesarean delivery affecting pregnancy; History of kidney problems; and Pre-existing hypertension during pregnancy, antepartum on their problem list.  Patient reports  some feet swelling .  Contractions: Not present. Vag. Bleeding: None.  Movement: Present. Denies leaking of fluid.   The following portions of the patient's history were reviewed and updated as appropriate: allergies, current medications, past family history, past medical history, past social history, past surgical history and problem list.   Objective:   Vitals:   03/06/23 0909  BP: 120/80  Weight: 182 lb (82.6 kg)    Fetal Status:     Movement: Present     FH- 30 cm FHR- 120s  General:  Alert, oriented and cooperative. Patient is in no acute distress.  Skin: Skin is warm and dry. No rash noted.   Cardiovascular: Normal heart rate noted  Respiratory: Normal respiratory effort, no problems with respiration noted  Abdomen: Soft, gravid, appropriate for gestational age.  Pain/Pressure: Present     Pelvic: Cervical exam deferred        Extremities: Normal range of motion.     Mental Status: Normal mood and affect. Normal behavior. Normal judgment and thought content.   Assessment and Plan:  Pregnancy: G2P1001 at [redacted]w[redacted]d 1. [redacted] weeks gestation of pregnancy - routine labs with glucola  2. Supervision of high risk pregnancy in second trimester - MFM anatomy scan - POC Urinalysis Dipstick OB  4. History of  cesarean delivery affecting pregnancy - she is considering TOLAC  5. Pre-existing hypertension during pregnancy, antepartum, unspecified pre-existing hypertension type - Comp Met (CMET)  6. H/o abnormal TSH (0.41 in 2022) - check TSH, free T4 today  7. + amphetamines with NOB labs (probably from her adderal use- she hasn't used it in several months, she says)  - recheck UDS today  8. Excessive weight gain in pregnancy (56 pounds to date) - schedule nutrition consult  Preterm labor symptoms and general obstetric precautions including but not limited to vaginal bleeding, contractions, leaking of fluid and fetal movement were reviewed in detail with the patient. Please refer to After Visit Summary for other counseling recommendations.   Return in about 2 weeks (around 03/20/2023).  No future appointments.  Emily Filbert, MD

## 2023-03-06 NOTE — Addendum Note (Signed)
Addended by: Meryl Dare on: 03/06/2023 11:38 AM   Modules accepted: Orders

## 2023-03-07 ENCOUNTER — Encounter: Payer: Self-pay | Admitting: Obstetrics & Gynecology

## 2023-03-07 LAB — 28 WEEK RH+PANEL
Basophils Absolute: 0 10*3/uL (ref 0.0–0.2)
Basos: 0 %
EOS (ABSOLUTE): 0 10*3/uL (ref 0.0–0.4)
Eos: 1 %
Gestational Diabetes Screen: 121 mg/dL (ref 70–139)
HIV Screen 4th Generation wRfx: NONREACTIVE
Hematocrit: 37.2 % (ref 34.0–46.6)
Hemoglobin: 12.6 g/dL (ref 11.1–15.9)
Immature Grans (Abs): 0 10*3/uL (ref 0.0–0.1)
Immature Granulocytes: 0 %
Lymphocytes Absolute: 1.4 10*3/uL (ref 0.7–3.1)
Lymphs: 19 %
MCH: 30.7 pg (ref 26.6–33.0)
MCHC: 33.9 g/dL (ref 31.5–35.7)
MCV: 91 fL (ref 79–97)
Monocytes Absolute: 0.5 10*3/uL (ref 0.1–0.9)
Monocytes: 6 %
Neutrophils Absolute: 5.3 10*3/uL (ref 1.4–7.0)
Neutrophils: 74 %
Platelets: 215 10*3/uL (ref 150–450)
RBC: 4.1 x10E6/uL (ref 3.77–5.28)
RDW: 12.8 % (ref 11.7–15.4)
RPR Ser Ql: NONREACTIVE
WBC: 7.3 10*3/uL (ref 3.4–10.8)

## 2023-03-07 LAB — COMPREHENSIVE METABOLIC PANEL
ALT: 16 IU/L (ref 0–32)
AST: 16 IU/L (ref 0–40)
Albumin/Globulin Ratio: 1.4 (ref 1.2–2.2)
Albumin: 3.7 g/dL — ABNORMAL LOW (ref 3.9–4.9)
Alkaline Phosphatase: 87 IU/L (ref 44–121)
BUN/Creatinine Ratio: 13 (ref 9–23)
BUN: 8 mg/dL (ref 6–20)
Bilirubin Total: <0.2 mg/dL (ref 0.0–1.2)
CO2: 22 mmol/L (ref 20–29)
Calcium: 8.6 mg/dL — ABNORMAL LOW (ref 8.7–10.2)
Chloride: 103 mmol/L (ref 96–106)
Creatinine, Ser: 0.63 mg/dL (ref 0.57–1.00)
Globulin, Total: 2.6 g/dL (ref 1.5–4.5)
Glucose: 125 mg/dL — ABNORMAL HIGH (ref 70–99)
Potassium: 3.9 mmol/L (ref 3.5–5.2)
Sodium: 138 mmol/L (ref 134–144)
Total Protein: 6.3 g/dL (ref 6.0–8.5)
eGFR: 121 mL/min/{1.73_m2} (ref >59)

## 2023-03-07 LAB — TSH+FREE T4
Free T4: 1.03 ng/dL (ref 0.82–1.77)
TSH: 0.534 u[IU]/mL (ref 0.450–4.500)

## 2023-03-18 ENCOUNTER — Telehealth: Payer: Self-pay

## 2023-03-18 NOTE — Telephone Encounter (Signed)
Called pt to follow up on after hours triage message, she reported some lite pink blood when she went to urinate. Pt reports that she had just had intercourse. Pt aware that can being on spotting, she also has some pressure in her back. She reports no dysuria. I advised her to monitor the pain, some pains are normal in later pregnancy. Left is know if this gets stronger. Pt states the Dr on call told her the same monitor back pain and call if worsen.

## 2023-03-19 ENCOUNTER — Ambulatory Visit (INDEPENDENT_AMBULATORY_CARE_PROVIDER_SITE_OTHER): Payer: Medicaid Other | Admitting: Advanced Practice Midwife

## 2023-03-19 VITALS — BP 120/80 | Wt 187.0 lb

## 2023-03-19 DIAGNOSIS — O0993 Supervision of high risk pregnancy, unspecified, third trimester: Secondary | ICD-10-CM

## 2023-03-19 DIAGNOSIS — O10019 Pre-existing essential hypertension complicating pregnancy, unspecified trimester: Secondary | ICD-10-CM

## 2023-03-19 DIAGNOSIS — Z3A31 31 weeks gestation of pregnancy: Secondary | ICD-10-CM

## 2023-03-19 DIAGNOSIS — Z3480 Encounter for supervision of other normal pregnancy, unspecified trimester: Secondary | ICD-10-CM

## 2023-03-19 DIAGNOSIS — O10013 Pre-existing essential hypertension complicating pregnancy, third trimester: Secondary | ICD-10-CM

## 2023-03-19 LAB — POCT URINALYSIS DIPSTICK OB
Bilirubin, UA: NEGATIVE
Blood, UA: NEGATIVE
Glucose, UA: NEGATIVE
Ketones, UA: NEGATIVE
Leukocytes, UA: NEGATIVE
Nitrite, UA: NEGATIVE
POC,PROTEIN,UA: NEGATIVE
Spec Grav, UA: 1.01 (ref 1.010–1.025)
Urobilinogen, UA: 0.2 E.U./dL
pH, UA: 7 (ref 5.0–8.0)

## 2023-03-19 NOTE — Progress Notes (Signed)
Routine Prenatal Care Visit  Subjective  Lydia Kennedy is a 32 y.o. G2P1001 at [redacted]w[redacted]d being seen today for ongoing prenatal care.  She is currently monitored for the following issues for this low-risk pregnancy and has Generalized anxiety disorder; Other somatoform disorders; Gastroesophageal reflux disease without esophagitis; Acute gastritis without hemorrhage; Supervision of other normal pregnancy, antepartum; Essential hypertension; History of cesarean delivery affecting pregnancy; History of kidney problems; Pre-existing hypertension during pregnancy, antepartum; Excessive weight gain during pregnancy in third trimester; ADHD; and Thyroid nodule on their problem list.  ----------------------------------------------------------------------------------- Patient reports  lower extremity swelling, low back pain and left side low abdominal pain. Reviewed 3rd trimester comfort measures .   Contractions: Not present. Vag. Bleeding: None.  Movement: Present. Leaking Fluid denies.  ----------------------------------------------------------------------------------- The following portions of the patient's history were reviewed and updated as appropriate: allergies, current medications, past family history, past medical history, past social history, past surgical history and problem list. Problem list updated.  Objective  Blood pressure 120/80, weight 187 lb (84.8 kg), last menstrual period 08/02/2022. Pregravid weight 126 lb (57.2 kg) Total Weight Gain 61 lb (27.7 kg) Urinalysis: Urine Protein Negative  Urine Glucose Negative  Fetal Status: Fetal Heart Rate (bpm): 133 Fundal Height: 31 cm Movement: Present     General:  Alert, oriented and cooperative. Patient is in no acute distress.  Skin: Skin is warm and dry. No rash noted.   Cardiovascular: Normal heart rate noted  Respiratory: Normal respiratory effort, no problems with respiration noted  Abdomen: Soft, gravid, appropriate for gestational  age. Pain/Pressure: Present     Pelvic:  Cervical exam deferred        Extremities: Normal range of motion.  Edema: None  Mental Status: Normal mood and affect. Normal behavior. Normal judgment and thought content.   Assessment   32 y.o. G2P1001 at [redacted]w[redacted]d by  05/18/2023, by Ultrasound presenting for routine prenatal visit  Plan   G2 Problems (from 11/22/22 to present)    Clinical Staff Provider  Office Location  [facility] Dating  By 9 wk u/s  Language  english Anatomy US    Flu Vaccine  Declined Genetic Screen  NIPS: Negative/Female: 10/19/22  TDaP vaccine   Declined  Hgb A1C or  GTT Early : Third trimester : 121  Covid Declined    LAB RESULTS   Rhogam  A/Positive/-- (10/27 [id number])  Blood Type A/Positive/-- (10/27 0859)   Feeding Plan Bottle/breast if able Antibody Negative (10/27 0859)  Contraception  Rubella 5.26 (10/27 0859)  Circumcision IF FEMALE YES RPR Non Reactive (10/27 0859)   Pediatrician   HBsAg Negative (10/27 0859)   Support Person Significant other HIV Non Reactive (10/27 0859)  Prenatal Classes  Varicella     GBS  (For PCN allergy, check sensitivities)   BTL Consent  Hep C Non Reactive (10/27 0859)   VBAC Consent  Pap Diagnosis  Date Value Ref Range Status  07/06/2020   Final   - Negative for intraepithelial lesion or malignancy (NILM)      Hgb Electro      CF      SMA           Preterm labor symptoms and general obstetric precautions including but not limited to vaginal bleeding, contractions, leaking of fluid and fetal movement were reviewed in detail with the patient. Please refer to After Visit Summary for other counseling recommendations.   Return in about 2 weeks (around 04/02/2023) for rob.  Rod Can, CNM 03/19/2023  4:54 PM

## 2023-03-29 ENCOUNTER — Observation Stay
Admission: EM | Admit: 2023-03-29 | Discharge: 2023-03-30 | Disposition: A | Discharge status: Home / Self Care | Payer: Medicaid Other | Attending: Obstetrics | Admitting: Obstetrics

## 2023-03-29 DIAGNOSIS — O1203 Gestational edema, third trimester: Principal | ICD-10-CM | POA: Insufficient documentation

## 2023-03-29 DIAGNOSIS — Z3A32 32 weeks gestation of pregnancy: Secondary | ICD-10-CM | POA: Insufficient documentation

## 2023-03-29 MED ORDER — ACETAMINOPHEN 325 MG PO TABS: 650.0000 mg | ORAL_TABLET | ORAL | Status: DC | PRN

## 2023-03-29 MED ORDER — ONDANSETRON HCL 4 MG/2ML IJ SOLN: 4.0000 mg | Freq: Four times a day (QID) | INTRAMUSCULAR | Status: DC | PRN

## 2023-03-29 MED ORDER — SOD CITRATE-CITRIC ACID 500-334 MG/5ML PO SOLN: 30.0000 mL | ORAL | Status: DC | PRN

## 2023-03-29 MED ORDER — LACTATED RINGERS IV SOLN: 500.0000 mL | INTRAVENOUS | Status: DC | PRN

## 2023-03-29 NOTE — OB Triage Note (Signed)
LABOR & DELIVERY OB TRIAGE NOTE  SUBJECTIVE  HPI Lydia Kennedy is a 32 y.o. G2P1001 at [redacted]w[redacted]d who presents to Labor & Delivery for swelling in her legs. 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 She endorses good fetal movement. She denies LOF, vaginal bleeding, and ctx. She denies HA and visual changes. She is taking her Procardia. She also reports smoking and vaping.  OB History     Gravida  2   Para  1   Term  1   Preterm      AB      Living  1      SAB      IAB      Ectopic      Multiple      Live Births  1           Scheduled Meds: Continuous Infusions:  lactated ringers     PRN Meds:.acetaminophen, lactated ringers, ondansetron, sodium citrate-citric acid  OBJECTIVE  LMP 08/02/2022 (Exact Date)   General: alert, cooperative Heart: RRR Lungs: CTAB Abdomen: soft, gravid, non-tender +1 pitting edema to shin   NST I reviewed the NST and it was reactive.  Baseline: 120 Variability: moderate Accelerations: present Decelerations:none Toco: none Category 1   ASSESSMENT Impression  1) Pregnancy at G2P1001, [redacted]w[redacted]d, Estimated Date of Delivery: 05/18/23 Reassuring maternal/fetal status 2) Mild edema of lower extremities  PLAN  1) Compression stockings ordered. Discussed comfort measures, elevating legs, massage, increased fluids and protein, warm bath. Encouraged smoking cessation.   2) Discharge home with standard return/labor precautions. Keep scheduled ROB.  Guadlupe Spanish, CNM 03/29/23  11:05 PM

## 2023-03-29 NOTE — OB Triage Note (Addendum)
Pt arrived to unit with complaints of swelling in legs. Pt is a G2P1 [redacted]w[redacted]d. Pt reports active fetal movement and no symptoms consistent with active vaginal bleeding or ROM. Patient placed on EFM and toco to non tender area of abdomen. Provider at nurses station on unit and aware of patient arrival. Will update provider of patient complaint. Consents for treatment signed.

## 2023-03-30 DIAGNOSIS — O1203 Gestational edema, third trimester: Secondary | ICD-10-CM | POA: Diagnosis not present

## 2023-03-30 NOTE — Progress Notes (Signed)
   03/30/23 0020  AVS Discharge Documentation  AVS Discharge Instructions Including Medications Provided to patient/caregiver  Name of Person Receiving AVS Discharge Instructions Including Medications Lydia Kennedy  Name of Clinician That Reviewed AVS Discharge Instructions Including Medications Zahir Eisenhour M. RN

## 2023-04-02 ENCOUNTER — Encounter: Payer: Medicaid Other | Admitting: Licensed Practical Nurse

## 2023-04-03 ENCOUNTER — Other Ambulatory Visit: Payer: Self-pay

## 2023-04-03 ENCOUNTER — Ambulatory Visit: Payer: Medicaid Other | Attending: Maternal & Fetal Medicine

## 2023-04-03 VITALS — BP 133/88 | HR 92 | Temp 97.6°F | Ht 65.0 in | Wt 191.0 lb

## 2023-04-03 DIAGNOSIS — O34219 Maternal care for unspecified type scar from previous cesarean delivery: Secondary | ICD-10-CM

## 2023-04-03 DIAGNOSIS — O99340 Other mental disorders complicating pregnancy, unspecified trimester: Secondary | ICD-10-CM | POA: Diagnosis not present

## 2023-04-03 DIAGNOSIS — O99283 Endocrine, nutritional and metabolic diseases complicating pregnancy, third trimester: Secondary | ICD-10-CM | POA: Diagnosis not present

## 2023-04-03 DIAGNOSIS — Z3A33 33 weeks gestation of pregnancy: Secondary | ICD-10-CM | POA: Diagnosis present

## 2023-04-03 DIAGNOSIS — O10013 Pre-existing essential hypertension complicating pregnancy, third trimester: Secondary | ICD-10-CM | POA: Diagnosis not present

## 2023-04-03 DIAGNOSIS — O99343 Other mental disorders complicating pregnancy, third trimester: Secondary | ICD-10-CM

## 2023-04-03 DIAGNOSIS — Z8639 Personal history of other endocrine, nutritional and metabolic disease: Secondary | ICD-10-CM

## 2023-04-03 DIAGNOSIS — O10913 Unspecified pre-existing hypertension complicating pregnancy, third trimester: Secondary | ICD-10-CM | POA: Insufficient documentation

## 2023-04-03 DIAGNOSIS — K219 Gastro-esophageal reflux disease without esophagitis: Secondary | ICD-10-CM

## 2023-04-03 DIAGNOSIS — E039 Hypothyroidism, unspecified: Secondary | ICD-10-CM | POA: Insufficient documentation

## 2023-04-03 DIAGNOSIS — F411 Generalized anxiety disorder: Secondary | ICD-10-CM

## 2023-04-03 DIAGNOSIS — F419 Anxiety disorder, unspecified: Secondary | ICD-10-CM

## 2023-04-03 DIAGNOSIS — Z87448 Personal history of other diseases of urinary system: Secondary | ICD-10-CM

## 2023-04-03 DIAGNOSIS — O10919 Unspecified pre-existing hypertension complicating pregnancy, unspecified trimester: Secondary | ICD-10-CM

## 2023-04-03 DIAGNOSIS — Z3480 Encounter for supervision of other normal pregnancy, unspecified trimester: Secondary | ICD-10-CM

## 2023-04-03 DIAGNOSIS — O10019 Pre-existing essential hypertension complicating pregnancy, unspecified trimester: Secondary | ICD-10-CM

## 2023-04-04 ENCOUNTER — Telehealth: Payer: Self-pay | Admitting: Obstetrics

## 2023-04-04 NOTE — Telephone Encounter (Signed)
I was contacting the patient via phone. Voicemail is full. We have changed her appointment to Tuesday, 4/23 for ultrasound and follow up at 8:15 am and 9:55 am with Melissa SON for routine follow. The patietn was requesting same morning or same afternoon for both imagine and provider follow up. I let the patient know I would contact her when I was able to offer.

## 2023-04-08 ENCOUNTER — Other Ambulatory Visit: Payer: Self-pay

## 2023-04-08 ENCOUNTER — Ambulatory Visit: Payer: Medicaid Other | Attending: Maternal & Fetal Medicine

## 2023-04-08 VITALS — BP 133/84 | HR 100 | Temp 98.6°F | Ht 65.0 in | Wt 190.5 lb

## 2023-04-08 DIAGNOSIS — Z3480 Encounter for supervision of other normal pregnancy, unspecified trimester: Secondary | ICD-10-CM

## 2023-04-08 DIAGNOSIS — O99343 Other mental disorders complicating pregnancy, third trimester: Secondary | ICD-10-CM

## 2023-04-08 DIAGNOSIS — O10013 Pre-existing essential hypertension complicating pregnancy, third trimester: Secondary | ICD-10-CM | POA: Insufficient documentation

## 2023-04-08 DIAGNOSIS — Z3A34 34 weeks gestation of pregnancy: Secondary | ICD-10-CM | POA: Diagnosis not present

## 2023-04-08 DIAGNOSIS — O10019 Pre-existing essential hypertension complicating pregnancy, unspecified trimester: Secondary | ICD-10-CM

## 2023-04-08 DIAGNOSIS — F419 Anxiety disorder, unspecified: Secondary | ICD-10-CM | POA: Insufficient documentation

## 2023-04-08 DIAGNOSIS — E039 Hypothyroidism, unspecified: Secondary | ICD-10-CM | POA: Insufficient documentation

## 2023-04-08 DIAGNOSIS — K219 Gastro-esophageal reflux disease without esophagitis: Secondary | ICD-10-CM

## 2023-04-08 DIAGNOSIS — F411 Generalized anxiety disorder: Secondary | ICD-10-CM

## 2023-04-08 DIAGNOSIS — O99340 Other mental disorders complicating pregnancy, unspecified trimester: Secondary | ICD-10-CM | POA: Insufficient documentation

## 2023-04-08 DIAGNOSIS — O34219 Maternal care for unspecified type scar from previous cesarean delivery: Secondary | ICD-10-CM | POA: Insufficient documentation

## 2023-04-08 DIAGNOSIS — Z87448 Personal history of other diseases of urinary system: Secondary | ICD-10-CM

## 2023-04-08 DIAGNOSIS — O99283 Endocrine, nutritional and metabolic diseases complicating pregnancy, third trimester: Secondary | ICD-10-CM | POA: Diagnosis not present

## 2023-04-10 ENCOUNTER — Telehealth: Payer: Self-pay

## 2023-04-10 NOTE — Telephone Encounter (Signed)
Pt called triage and said she had her BPP U/S on Monday and was advised by the tech to call us to schedule an appt to discuss scheduling c-section. I checked with [facility] first regarding MFM u/s f/u's and she said since pt has another BPP u/s on 4/23 and ROB w Missy on 4/23, this can wait till then (4/23) to be discussed. Pt aware.

## 2023-04-15 ENCOUNTER — Other Ambulatory Visit: Payer: Medicaid Other

## 2023-04-15 ENCOUNTER — Encounter: Payer: Medicaid Other | Admitting: Obstetrics

## 2023-04-16 ENCOUNTER — Ambulatory Visit (INDEPENDENT_AMBULATORY_CARE_PROVIDER_SITE_OTHER): Payer: Medicaid Other

## 2023-04-16 ENCOUNTER — Other Ambulatory Visit: Payer: Self-pay | Admitting: Obstetrics and Gynecology

## 2023-04-16 ENCOUNTER — Telehealth: Payer: Self-pay

## 2023-04-16 ENCOUNTER — Other Ambulatory Visit (HOSPITAL_COMMUNITY)
Admission: RE | Admit: 2023-04-16 | Discharge: 2023-04-16 | Disposition: A | Discharge status: Home / Self Care | Payer: Medicaid Other | Source: Ambulatory Visit | Attending: Obstetrics | Admitting: Obstetrics

## 2023-04-16 ENCOUNTER — Other Ambulatory Visit: Payer: Self-pay

## 2023-04-16 ENCOUNTER — Ambulatory Visit (INDEPENDENT_AMBULATORY_CARE_PROVIDER_SITE_OTHER): Payer: Medicaid Other | Admitting: Obstetrics

## 2023-04-16 VITALS — BP 120/79 | HR 76 | Wt 196.0 lb

## 2023-04-16 DIAGNOSIS — Z113 Encounter for screening for infections with a predominantly sexual mode of transmission: Secondary | ICD-10-CM | POA: Diagnosis present

## 2023-04-16 DIAGNOSIS — Z369 Encounter for antenatal screening, unspecified: Secondary | ICD-10-CM

## 2023-04-16 DIAGNOSIS — O34219 Maternal care for unspecified type scar from previous cesarean delivery: Secondary | ICD-10-CM

## 2023-04-16 DIAGNOSIS — O0993 Supervision of high risk pregnancy, unspecified, third trimester: Secondary | ICD-10-CM

## 2023-04-16 DIAGNOSIS — Z3A35 35 weeks gestation of pregnancy: Secondary | ICD-10-CM

## 2023-04-16 DIAGNOSIS — O10013 Pre-existing essential hypertension complicating pregnancy, third trimester: Secondary | ICD-10-CM

## 2023-04-16 DIAGNOSIS — O10019 Pre-existing essential hypertension complicating pregnancy, unspecified trimester: Secondary | ICD-10-CM

## 2023-04-16 DIAGNOSIS — Z3480 Encounter for supervision of other normal pregnancy, unspecified trimester: Secondary | ICD-10-CM

## 2023-04-16 DIAGNOSIS — R0789 Other chest pain: Secondary | ICD-10-CM

## 2023-04-16 DIAGNOSIS — Z3685 Encounter for antenatal screening for Streptococcus B: Secondary | ICD-10-CM

## 2023-04-16 LAB — POCT URINALYSIS DIPSTICK OB
Bilirubin, UA: NEGATIVE
Blood, UA: NEGATIVE
Glucose, UA: NEGATIVE
Ketones, UA: NEGATIVE
Leukocytes, UA: NEGATIVE
Nitrite, UA: NEGATIVE
POC,PROTEIN,UA: NEGATIVE
Spec Grav, UA: 1.01 (ref 1.010–1.025)
Urobilinogen, UA: 0.2 E.U./dL
pH, UA: 6.5 (ref 5.0–8.0)

## 2023-04-16 NOTE — Progress Notes (Signed)
MFM did not have availability to see this patient. Asked for her to be scanned for BPP at our office. Order added.

## 2023-04-16 NOTE — Telephone Encounter (Signed)
I spoke with the patient face to face. The patient is now scheduled for 4/30 with Dr Valentino Saxon, 4/29 at 4 for BPP ultrasound prior to seeing Dr. Valentino Saxon.

## 2023-04-16 NOTE — Progress Notes (Signed)
ROB at [redacted]w[redacted]d. Lydia Kennedy denies LOF and vaginal bleeding. She is having occasional ctx. She reports that she is having "pins and needles" sensation and pain at the top of her abdomen. Discussed likely DR and encouraged abdominal binder and good body mechanics. She is also feeling some chest tightness and SOB at night time. She is not able to identify a trigger. Referral to cardiology placed. Encouraged Prilosec for reflux symptoms.  Lydia Kennedy has decided she would like a RCS. Dr. Logan Bores to schedule. She will have an MD visit next week for CS consult. Reiterated importance of keeping scheduled visit. BPP today 8/8.  [name], CNM

## 2023-04-16 NOTE — Telephone Encounter (Signed)
Thanks

## 2023-04-17 LAB — CERVICOVAGINAL ANCILLARY ONLY
Chlamydia: NEGATIVE
Comment: NEGATIVE
Comment: NORMAL
Neisseria Gonorrhea: NEGATIVE

## 2023-04-17 NOTE — Addendum Note (Signed)
Addended by: Loney Laurence on: 04/17/2023 09:43 AM   Modules accepted: Orders

## 2023-04-18 ENCOUNTER — Telehealth: Payer: Self-pay

## 2023-04-18 NOTE — Telephone Encounter (Signed)
Patient contacted offie triage line to reports symptoms of sever lower back pain >24 hours, irregular contractions >57min apart and shortness of breath. While speaking to patient on phone she seemed very labored with her breathing and states that she was in pain. Patient denies abdominal pain/pelvic pain, vaginal bleeding/discharge, nausea/vomiting. Advised patient to report to nearest ED immediately for medical evaluation. KW

## 2023-04-19 LAB — STREP GP B NAA+RFLX: Strep Gp B NAA+Rflx: NEGATIVE

## 2023-04-22 ENCOUNTER — Other Ambulatory Visit: Payer: Medicaid Other

## 2023-04-22 ENCOUNTER — Ambulatory Visit (INDEPENDENT_AMBULATORY_CARE_PROVIDER_SITE_OTHER): Payer: Medicaid Other

## 2023-04-22 ENCOUNTER — Other Ambulatory Visit: Payer: Self-pay

## 2023-04-22 DIAGNOSIS — O10013 Pre-existing essential hypertension complicating pregnancy, third trimester: Secondary | ICD-10-CM

## 2023-04-22 DIAGNOSIS — Z3A36 36 weeks gestation of pregnancy: Secondary | ICD-10-CM | POA: Diagnosis not present

## 2023-04-22 DIAGNOSIS — O10019 Pre-existing essential hypertension complicating pregnancy, unspecified trimester: Secondary | ICD-10-CM

## 2023-04-22 NOTE — Progress Notes (Signed)
BPP order added for u/s today.

## 2023-04-23 ENCOUNTER — Ambulatory Visit (INDEPENDENT_AMBULATORY_CARE_PROVIDER_SITE_OTHER): Payer: Medicaid Other | Admitting: Obstetrics and Gynecology

## 2023-04-23 ENCOUNTER — Encounter: Payer: Self-pay | Admitting: Obstetrics and Gynecology

## 2023-04-23 VITALS — BP 135/89 | HR 98 | Wt 198.2 lb

## 2023-04-23 DIAGNOSIS — O99891 Other specified diseases and conditions complicating pregnancy: Secondary | ICD-10-CM

## 2023-04-23 DIAGNOSIS — O1203 Gestational edema, third trimester: Secondary | ICD-10-CM

## 2023-04-23 DIAGNOSIS — K219 Gastro-esophageal reflux disease without esophagitis: Secondary | ICD-10-CM

## 2023-04-23 DIAGNOSIS — Z3A36 36 weeks gestation of pregnancy: Secondary | ICD-10-CM

## 2023-04-23 DIAGNOSIS — O10013 Pre-existing essential hypertension complicating pregnancy, third trimester: Secondary | ICD-10-CM

## 2023-04-23 DIAGNOSIS — I10 Essential (primary) hypertension: Secondary | ICD-10-CM

## 2023-04-23 DIAGNOSIS — F411 Generalized anxiety disorder: Secondary | ICD-10-CM

## 2023-04-23 DIAGNOSIS — O2603 Excessive weight gain in pregnancy, third trimester: Secondary | ICD-10-CM

## 2023-04-23 DIAGNOSIS — O0993 Supervision of high risk pregnancy, unspecified, third trimester: Secondary | ICD-10-CM

## 2023-04-23 DIAGNOSIS — O09293 Supervision of pregnancy with other poor reproductive or obstetric history, third trimester: Secondary | ICD-10-CM

## 2023-04-23 DIAGNOSIS — O99343 Other mental disorders complicating pregnancy, third trimester: Secondary | ICD-10-CM

## 2023-04-23 DIAGNOSIS — O99613 Diseases of the digestive system complicating pregnancy, third trimester: Secondary | ICD-10-CM

## 2023-04-23 DIAGNOSIS — O34219 Maternal care for unspecified type scar from previous cesarean delivery: Secondary | ICD-10-CM

## 2023-04-23 DIAGNOSIS — Z3480 Encounter for supervision of other normal pregnancy, unspecified trimester: Secondary | ICD-10-CM

## 2023-04-23 LAB — POCT URINALYSIS DIPSTICK OB
Bilirubin, UA: NEGATIVE
Blood, UA: NEGATIVE
Glucose, UA: NEGATIVE
Ketones, UA: NEGATIVE
Leukocytes, UA: NEGATIVE
Nitrite, UA: NEGATIVE
Spec Grav, UA: 1.01 (ref 1.010–1.025)
Urobilinogen, UA: 0.2 E.U./dL
pH, UA: 6.5 (ref 5.0–8.0)

## 2023-04-23 MED ORDER — CYCLOBENZAPRINE HCL 10 MG PO TABS: 10.0000 mg | ORAL_TABLET | Freq: Three times a day (TID) | ORAL | 0 refills | Status: DC | PRN

## 2023-04-23 NOTE — Progress Notes (Signed)
ROB [redacted]w[redacted]d: She is doing well. She has been having some pain in her ribs on the right mid back, that makes it difficult for her to sleep, this has been going on for 1.5 weeks. She also has concerns about swelling and discomfort and bruising in the back of both of her knees. She thinks that it may be her varicose veins.

## 2023-04-23 NOTE — Progress Notes (Signed)
ROB: Patient is a 32 y.o. G2P1001 at [redacted]w[redacted]d who presents for routine OB care.  Pregnancy is complicated by  Generalized anxiety disorder; Other somatoform disorders; Gastroesophageal reflux disease without esophagitis; History of cesarean delivery affecting pregnancy; History of kidney problems; Pre-existing hypertension during pregnancy, antepartum; Excessive weight gain during pregnancy in third trimester; ADHD; and Thyroid nodule.. Patient has complaints of swelling in legs behind calves bilaterally with bruising? Uses compressions stockings both night and day, advised to discontinue use at night and can elevate legs instead. Also c/o pain in her upper back on right side for several days. Hurts to breathe or move in certain positions. Denies any recent strain or trauma to her back.  Has tried [employer], Tylenol without relief. Mildly tender to palpation, likely musculoskeletal.  Offered muscle relaxant, will prescribe.  Discussed upcoming C-section, answered all questions.  36 week cultures performed last week, negative. Had BPP yesterday with MFM, normal. RTC in 1 week, for NST then .

## 2023-04-24 ENCOUNTER — Encounter: Payer: Medicaid Other | Admitting: Certified Nurse Midwife

## 2023-04-24 ENCOUNTER — Other Ambulatory Visit: Payer: Medicaid Other

## 2023-04-26 ENCOUNTER — Other Ambulatory Visit: Payer: Self-pay | Admitting: Obstetrics

## 2023-04-27 ENCOUNTER — Other Ambulatory Visit: Payer: Self-pay

## 2023-04-27 DIAGNOSIS — Z87448 Personal history of other diseases of urinary system: Secondary | ICD-10-CM

## 2023-04-27 DIAGNOSIS — O10019 Pre-existing essential hypertension complicating pregnancy, unspecified trimester: Secondary | ICD-10-CM

## 2023-04-27 DIAGNOSIS — F411 Generalized anxiety disorder: Secondary | ICD-10-CM

## 2023-04-27 DIAGNOSIS — K219 Gastro-esophageal reflux disease without esophagitis: Secondary | ICD-10-CM

## 2023-04-27 DIAGNOSIS — O34219 Maternal care for unspecified type scar from previous cesarean delivery: Secondary | ICD-10-CM

## 2023-04-29 ENCOUNTER — Other Ambulatory Visit: Payer: Medicaid Other

## 2023-04-29 NOTE — Progress Notes (Deleted)
Pt was a "No Show" today at MFM clinic

## 2023-04-30 ENCOUNTER — Other Ambulatory Visit: Payer: Self-pay | Admitting: Obstetrics and Gynecology

## 2023-04-30 DIAGNOSIS — Z3A36 36 weeks gestation of pregnancy: Secondary | ICD-10-CM

## 2023-05-01 ENCOUNTER — Ambulatory Visit (INDEPENDENT_AMBULATORY_CARE_PROVIDER_SITE_OTHER): Payer: Medicaid Other | Admitting: Obstetrics and Gynecology

## 2023-05-01 ENCOUNTER — Ambulatory Visit (INDEPENDENT_AMBULATORY_CARE_PROVIDER_SITE_OTHER): Payer: Managed Care, Other (non HMO)

## 2023-05-01 ENCOUNTER — Encounter: Payer: Self-pay | Admitting: Obstetrics and Gynecology

## 2023-05-01 VITALS — BP 130/82 | HR 95 | Wt 200.2 lb

## 2023-05-01 VITALS — BP 130/82 | HR 95 | Ht 65.0 in | Wt 200.2 lb

## 2023-05-01 DIAGNOSIS — Z3A37 37 weeks gestation of pregnancy: Secondary | ICD-10-CM

## 2023-05-01 DIAGNOSIS — I10 Essential (primary) hypertension: Secondary | ICD-10-CM

## 2023-05-01 DIAGNOSIS — O10013 Pre-existing essential hypertension complicating pregnancy, third trimester: Secondary | ICD-10-CM

## 2023-05-01 DIAGNOSIS — O10913 Unspecified pre-existing hypertension complicating pregnancy, third trimester: Secondary | ICD-10-CM | POA: Insufficient documentation

## 2023-05-01 DIAGNOSIS — O0993 Supervision of high risk pregnancy, unspecified, third trimester: Secondary | ICD-10-CM

## 2023-05-01 HISTORY — DX: Unspecified pre-existing hypertension complicating pregnancy, third trimester: O10.913

## 2023-05-01 LAB — POCT URINALYSIS DIPSTICK OB
Appearance: NORMAL
Bilirubin, UA: NEGATIVE
Blood, UA: NEGATIVE
Glucose, UA: NEGATIVE
Ketones, UA: NEGATIVE
Leukocytes, UA: NEGATIVE
Nitrite, UA: NEGATIVE
Odor: NORMAL
POC,PROTEIN,UA: NEGATIVE
Spec Grav, UA: 1.015 (ref 1.010–1.025)
Urobilinogen, UA: 0.2 E.U./dL
pH, UA: 5 (ref 5.0–8.0)

## 2023-05-01 NOTE — Progress Notes (Addendum)
ROB [redacted]w[redacted]d: NST. Patient reports good fetal movement with pelvic pain/pressure and irregular contractions. Patient states no other concerns at this time.

## 2023-05-01 NOTE — Patient Instructions (Signed)

## 2023-05-01 NOTE — Progress Notes (Signed)
ROB: Reports that she is doing well.  Has some lower extremity edema and some varicose veins but otherwise normal.  Reports daily fetal movement.  She is being followed for chronic hypertension and her blood pressure is currently controlled on Procardia.  NST today is reactive.  Cesarean delivery has been scheduled for May 20.

## 2023-05-01 NOTE — Progress Notes (Signed)
    NURSE VISIT NOTE  Subjective:    Patient ID: Lydia Kennedy, female    DOB: 1991-02-08, 32 y.o.   MRN: [id number]  HPI  Patient is a 32 y.o. G23P1001 female who presents for fetal monitoring per order from Hildred Laser, MD.   Objective:    BP 130/82   Pulse 95   Ht 5\' 5"  (1.651 m)   Wt 200 lb 3.2 oz (90.8 kg)   LMP 08/02/2022 (Exact Date)   BMI 33.32 kg/m  Estimated Date of Delivery: 05/18/23  [redacted]w[redacted]d  Fetus A Non-Stress Test Interpretation for 05/01/23  Indication: Chronic Hypertenstion  Fetal Heart Rate A Mode: External Baseline Rate (A): 135 bpm Variability: Moderate Accelerations: 15 x 15 Decelerations: None Multiple birth?: No  Uterine Activity Mode: Toco Contraction Frequency (min): ui  Interpretation (Fetal Testing) Nonstress Test Interpretation: Reactive Overall Impression: Reassuring for gestational age   Assessment:   1. Maternal chronic hypertension, third trimester   2. [redacted] weeks gestation of pregnancy      Plan:   Results reviewed and discussed with patient by  Brennan Bailey, MD.     Rocco Serene, LPN

## 2023-05-02 ENCOUNTER — Encounter: Payer: Self-pay | Admitting: Obstetrics and Gynecology

## 2023-05-03 ENCOUNTER — Other Ambulatory Visit: Payer: Medicaid Other

## 2023-05-05 ENCOUNTER — Observation Stay
Admission: EM | Admit: 2023-05-05 | Discharge: 2023-05-06 | Disposition: A | Discharge status: Home / Self Care | Payer: Managed Care, Other (non HMO) | Attending: Certified Nurse Midwife | Admitting: Certified Nurse Midwife

## 2023-05-05 DIAGNOSIS — O26893 Other specified pregnancy related conditions, third trimester: Principal | ICD-10-CM | POA: Insufficient documentation

## 2023-05-05 DIAGNOSIS — R112 Nausea with vomiting, unspecified: Secondary | ICD-10-CM | POA: Insufficient documentation

## 2023-05-05 DIAGNOSIS — Z3A38 38 weeks gestation of pregnancy: Secondary | ICD-10-CM | POA: Insufficient documentation

## 2023-05-05 DIAGNOSIS — G43909 Migraine, unspecified, not intractable, without status migrainosus: Secondary | ICD-10-CM | POA: Insufficient documentation

## 2023-05-05 DIAGNOSIS — Z3480 Encounter for supervision of other normal pregnancy, unspecified trimester: Secondary | ICD-10-CM

## 2023-05-06 ENCOUNTER — Telehealth: Payer: Self-pay

## 2023-05-06 ENCOUNTER — Other Ambulatory Visit: Payer: Self-pay

## 2023-05-06 ENCOUNTER — Encounter: Payer: Self-pay | Admitting: Certified Nurse Midwife

## 2023-05-06 ENCOUNTER — Other Ambulatory Visit: Payer: Self-pay | Admitting: Advanced Practice Midwife

## 2023-05-06 DIAGNOSIS — O219 Vomiting of pregnancy, unspecified: Secondary | ICD-10-CM

## 2023-05-06 DIAGNOSIS — O26893 Other specified pregnancy related conditions, third trimester: Secondary | ICD-10-CM | POA: Diagnosis present

## 2023-05-06 DIAGNOSIS — G43909 Migraine, unspecified, not intractable, without status migrainosus: Secondary | ICD-10-CM

## 2023-05-06 DIAGNOSIS — O99353 Diseases of the nervous system complicating pregnancy, third trimester: Secondary | ICD-10-CM | POA: Diagnosis not present

## 2023-05-06 DIAGNOSIS — Z3A28 28 weeks gestation of pregnancy: Secondary | ICD-10-CM | POA: Diagnosis not present

## 2023-05-06 DIAGNOSIS — R519 Headache, unspecified: Secondary | ICD-10-CM

## 2023-05-06 DIAGNOSIS — R112 Nausea with vomiting, unspecified: Secondary | ICD-10-CM | POA: Diagnosis not present

## 2023-05-06 DIAGNOSIS — Z3A38 38 weeks gestation of pregnancy: Secondary | ICD-10-CM | POA: Diagnosis not present

## 2023-05-06 LAB — CBC
HCT: 35.5 % — ABNORMAL LOW (ref 36.0–46.0)
Hemoglobin: 12.3 g/dL (ref 12.0–15.0)
MCH: 30.4 pg (ref 26.0–34.0)
MCHC: 34.6 g/dL (ref 30.0–36.0)
MCV: 87.7 fL (ref 80.0–100.0)
Platelets: 234 10*3/uL (ref 150–400)
RBC: 4.05 MIL/uL (ref 3.87–5.11)
RDW: 12.6 % (ref 11.5–15.5)
WBC: 9 10*3/uL (ref 4.0–10.5)
nRBC: 0 % (ref 0.0–0.2)

## 2023-05-06 LAB — URINALYSIS, ROUTINE W REFLEX MICROSCOPIC
Bilirubin Urine: NEGATIVE
Glucose, UA: NEGATIVE mg/dL
Hgb urine dipstick: NEGATIVE
Leukocytes,Ua: NEGATIVE
Nitrite: NEGATIVE
Protein, ur: 30 mg/dL — AB
Specific Gravity, Urine: 1.02 (ref 1.005–1.030)
pH: 8.5 — ABNORMAL HIGH (ref 5.0–8.0)

## 2023-05-06 LAB — COMPREHENSIVE METABOLIC PANEL
ALT: 14 U/L (ref 0–44)
AST: 22 U/L (ref 15–41)
Albumin: 2.7 g/dL — ABNORMAL LOW (ref 3.5–5.0)
Alkaline Phosphatase: 128 U/L — ABNORMAL HIGH (ref 38–126)
Anion gap: 8 (ref 5–15)
BUN: 13 mg/dL (ref 6–20)
CO2: 23 mmol/L (ref 22–32)
Calcium: 8.6 mg/dL — ABNORMAL LOW (ref 8.9–10.3)
Chloride: 103 mmol/L (ref 98–111)
Creatinine, Ser: 0.68 mg/dL (ref 0.44–1.00)
GFR, Estimated: >60 mL/min (ref >60)
Glucose, Bld: 134 mg/dL — ABNORMAL HIGH (ref 70–99)
Potassium: 3.6 mmol/L (ref 3.5–5.1)
Sodium: 134 mmol/L — ABNORMAL LOW (ref 135–145)
Total Bilirubin: 0.3 mg/dL (ref 0.3–1.2)
Total Protein: 6.5 g/dL (ref 6.5–8.1)

## 2023-05-06 LAB — PROTEIN / CREATININE RATIO, URINE
Creatinine, Urine: 157 mg/dL
Protein Creatinine Ratio: 0.15 mg/mg{Cre} (ref 0.00–0.15)
Total Protein, Urine: 24 mg/dL

## 2023-05-06 MED ORDER — LACTATED RINGERS IV SOLN
500.0000 mL | INTRAVENOUS | Status: DC | PRN
  Administered 2023-05-06: 1000 mL via INTRAVENOUS

## 2023-05-06 MED ORDER — BUTALBITAL-APAP-CAFFEINE 50-325-40 MG PO TABS
2.0000 | ORAL_TABLET | Freq: Four times a day (QID) | ORAL | Status: DC | PRN
  Administered 2023-05-06: 2 via ORAL
  Filled 2023-05-06: qty 2

## 2023-05-06 MED ORDER — ONDANSETRON 4 MG PO TBDP
8.0000 mg | ORAL_TABLET | Freq: Once | ORAL | Status: AC
  Administered 2023-05-06: 8 mg via ORAL
  Filled 2023-05-06: qty 2

## 2023-05-06 MED ORDER — BUTALBITAL-APAP-CAFFEINE 50-325-40 MG PO TABS
1.0000 | ORAL_TABLET | Freq: Four times a day (QID) | ORAL | 0 refills | Status: DC | PRN
Start: 2023-05-06 — End: 2023-06-19

## 2023-05-06 MED ORDER — LACTATED RINGERS IV SOLN: INTRAVENOUS | Status: DC

## 2023-05-06 NOTE — Telephone Encounter (Signed)
Called Lydia Kennedy to check in on her after seeing she was advised to go to the ED for migraine. She states that they gave her some medication in the ED to help with the migraine but didn't send her home with any medications. Lydia Kennedy states this morning that the migraine is back this morning in the back of her head. I asked her how her blood pressure has been she states they have been good and that she had labs done while in the ED.   Can you please advise on what she can do or take to help with the migraine?

## 2023-05-06 NOTE — OB Triage Note (Signed)
Lydia Kennedy 32 y.o. G2P1001 at 38wk2d presents to Labor & Delivery triage via wheelchair steered by ED staff reporting an ongoing migraine since this past Friday at 8/10 pain. She also reports having nausea and vomiting for the past week that has lessened since Friday, and reports her daughter had Bronchitis 1-2wks ago. Patient also informed RN that she has a scheduled cesarean section on 5/20. She denies leaking of fluid or bleeding. She reports feeling contractions since this past week and positive fetal movement. External FM and TOCO applied to non-tender abdomen. Initial FHR 135. Vital signs obtained and within normal limits. Patient oriented to care environment including call bell and bed control use. Janee Morn, CNM notified of patient's arrival.

## 2023-05-06 NOTE — Progress Notes (Signed)
Rx fioricet and recommendations: increase hydration and sleep, take 0tc magnesium 400 mg daily, caffeine, fioricet as needed.

## 2023-05-06 NOTE — OB Triage Note (Signed)
   L&D OB Triage Note  SUBJECTIVE Lydia Kennedy is a 32 y.o. G2P1001 female at [redacted]w[redacted]d, EDD Estimated Date of Delivery: 05/18/23 who presented to triage with complaints of migraine headache, nausea and vomiting which is now improved. She denies contractions, loss of fluid , vaginal bleeding and is feeling good movement.   OB History  Gravida Para Term Preterm AB Living  2 1 1  0 0 1  SAB IAB Ectopic Multiple Live Births  0 0 0 0 1    # Outcome Date GA Lbr Len/2nd Weight Sex Delivery Anes PTL Lv  2 Current           1 Term 07/12/11 [redacted]w[redacted]d  3600 g F CS-LTranv   LIV     Complications: Failure to Progress in Second Stage     Name: zayli    Medications Prior to Admission  Medication Sig Dispense Refill Last Dose   NIFEdipine (PROCARDIA-XL/NIFEDICAL-XL) 30 MG 24 hr tablet Take 1 tablet (30 mg total) by mouth daily. Can increase to twice a day as needed for symptomatic contractions 30 tablet 2 05/05/2023 at 1900   Prenatal Vit-Fe Fumarate-FA (WESTAB PLUS) 27-1 MG TABS TAKE 1 TABLET BY MOUTH EVERY DAY 90 tablet 1 05/05/2023 at 1900   cyclobenzaprine (FLEXERIL) 10 MG tablet Take 1 tablet (10 mg total) by mouth 3 (three) times daily as needed for muscle spasms. (Patient not taking: Reported on 05/05/2023) 20 tablet 0 Not Taking     OBJECTIVE  Nursing Evaluation:   BP 117/78   Pulse 92   Temp (!) 97.2 F (36.2 C) (Oral)   LMP 08/02/2022 (Exact Date)    Findings:   negative pre eclampsia labs    Headache resolved with medication     Nausea improved with medication   NST was performed and has been reviewed by me.  NST INTERPRETATION: Category I  Baseline 130 Moderate variability Accelerations present Decelerations absent  Ctx: irregular   ASSESSMENT Impression:  1.  Pregnancy:  G2P1001 at [redacted]w[redacted]d , EDD Estimated Date of Delivery: 05/18/23 2.  Reassuring fetal and maternal status 3.  Migraine headache   PLAN 1. Current condition and above findings reviewed.  Reassuring fetal and  maternal condition. 2. Discharge home with standard labor precautions given to return to L&D or call the office for problems. 3. Continue routine prenatal care.  Doreene Burke, CNM

## 2023-05-06 NOTE — Progress Notes (Signed)
Patient discharged home per Provider order. After Visit Summary was printed and given to the patient. Discharge education completed with patient and support person including follow up instructions, appointments, and medication list. +FM on EFM prior to removal of monitors. Informed patient to not drive home due to administration of Fioricet and to have support person drive her home. Patient verbalized understanding and voiced no questions. Patient instructed to return to ED, call 911, or call provider for any changes in condition. Patient discharged home accompanied by support person.

## 2023-05-07 ENCOUNTER — Encounter
Admission: RE | Admit: 2023-05-07 | Discharge: 2023-05-07 | Disposition: A | Discharge status: Home / Self Care | Payer: Medicaid Other | Source: Ambulatory Visit | Attending: Obstetrics and Gynecology | Admitting: Obstetrics and Gynecology

## 2023-05-07 HISTORY — DX: Nontoxic single thyroid nodule: E04.1

## 2023-05-07 HISTORY — DX: Essential (primary) hypertension: I10

## 2023-05-07 NOTE — Patient Instructions (Addendum)
Your procedure is scheduled on: Monday, May 20  Arrival Time: Please call Labor and Delivery on Friday, May 17 to find out your arrival time. [phone].  Arrival: If your arrival time is prior to 6:00 am, please enter through the Emergency Room Entrance and you will be directed to Labor and Delivery. If your arrival time is 6:00 am or later, please enter the Medical Mall and follow the greeter's instructions.  REMEMBER: Instructions that are not followed completely may result in serious medical risk, up to and including death; or upon the discretion of your surgeon and anesthesiologist your surgery may need to be rescheduled.  Do not eat or drink after midnight the night before surgery.  No gum chewing or hard candies.  One week prior to surgery: starting May 14 Stop Anti-inflammatories (NSAIDS) such as Advil, Aleve, Ibuprofen, Motrin, Naproxen, Naprosyn and Aspirin based products such as Excedrin, Goody's Powder, BC Powder. Stop ANY OVER THE COUNTER supplements until after surgery. You may however, continue to take Tylenol if needed for pain up until the day of surgery.  Continue taking all prescribed medications   DO NOT TAKE ANY MEDICATIONS THE MORNING OF SURGERY   No Alcohol for 24 hours before or after surgery.  No Smoking including e-cigarettes for 24 hours prior to surgery.  No chewable tobacco products for at least 6 hours prior to surgery.  No nicotine patches on the day of surgery.  Do not use any "recreational" drugs for at least a week prior to your surgery.  Please be advised that the combination of cocaine and anesthesia may have negative outcomes, up to and including death. If you test positive for cocaine, your surgery will be cancelled.  On the morning of surgery brush your teeth with toothpaste and water, you may rinse your mouth with mouthwash if you wish. Do not swallow any toothpaste or mouthwash.  Use CHG wipes as directed on instruction sheet.  Do not  wear jewelry, make-up, hairpins, clips or nail polish.  Do not wear lotions, powders, or perfumes.   Do not shave body hair from the neck down 48 hours before surgery.  Contact lenses, hearing aids and dentures may not be worn into surgery.  Do not bring valuables to the hospital. [facility] is not responsible for any missing/lost belongings or valuables.   Notify your doctor if there is any change in your medical condition (cold, fever, infection).  Wear comfortable clothing (specific to your surgery type) to the hospital.  After surgery, you can help prevent lung complications by doing breathing exercises.  Take deep breaths and cough every 1-2 hours. Your doctor may order a device called an Incentive Spirometer to help you take deep breaths. When coughing or sneezing, hold a pillow firmly against your incision with both hands. This is called "splinting." Doing this helps protect your incision. It also decreases belly discomfort.  Please call the Pre-admissions Testing Dept. at [phone] if you have any questions about these instructions.  Surgery Visitation Policy:  Visitor Passes   All visitors, including children, need an identification sticker when visiting. These stickers must be worn where they can be seen.   Labor & Delivery  Laboring women may have one designated support person and two other visitors of any age visit. The support person must remain the same. The visitors may switch with other visitors. Visitation is permitted 24 hours per day. The designated support person or a visitor over the age of 16 may sleep overnight in  the patient's room. A doula registered with [facility] for labor and delivery support is not considered a visitor. Doulas not registered with [facility] are considered visitors.  Mother Baby Unit, OB Specialty and Gynecological Care  A designated support person and three visitors of any age may visit. The three visitors may switch  out. The designated support person or a visitor age 49 or older may stay overnight in the room. During the postpartum period (up to 6 weeks), if the mother is the patient, she can have her newborn stay with her if there is another support person present who can be responsible for the baby.    Preparing the Skin Before Surgery     To help prevent the risk of infection at your surgical site, we are now providing you with rinse-free Sage 2% Chlorhexidine Gluconate (CHG) disposable wipes.  Chlorhexidine Gluconate (CHG) Soap  o An antiseptic cleaner that kills germs and bonds with the skin to continue killing germs even after washing  o Used for showering the night before surgery and morning of surgery  The night before surgery: Shower or bathe with warm water. Do not apply perfume, lotions, powders. Wait one hour after shower. Skin should be dry and cool. Open Sage wipe package - use 6 disposable cloths. Wipe body using one cloth for the right arm, one cloth for the left arm, one cloth for the right leg, one cloth for the left leg, one cloth for the chest/abdomen area, and one cloth for the back. Do not use on open wounds or sores. Do not use on face or genitals (private parts). If you are breast feeding, do not use on breasts. 5. Do not rinse, allow to dry. 6. Skin may feel "tacky" for several minutes. 7. Dress in clean clothes. 8. Place clean sheets on your bed and do not sleep with pets.  REPEAT ABOVE ON THE MORNING OF SURGERY BEFORE ARRIVING TO THE HOSPITAL.

## 2023-05-08 DIAGNOSIS — Z3A38 38 weeks gestation of pregnancy: Secondary | ICD-10-CM | POA: Insufficient documentation

## 2023-05-08 NOTE — Patient Instructions (Signed)

## 2023-05-08 NOTE — Progress Notes (Unsigned)
    NURSE VISIT NOTE  Subjective:    Patient ID: Lydia Kennedy, female    DOB: [birth year], 32 y.o.   MRN: [id number]  HPI  Patient is a 32 y.o. G53P1001 female who presents for fetal monitoring per order from Brennan Bailey, MD.   Objective:    LMP 08/02/2022 (Exact Date)  Estimated Date of Delivery: 05/18/23  [redacted]w[redacted]d  Fetus A Non-Stress Test Interpretation for 05/08/23  Indication: Chronic Hypertenstion            Assessment:   1. Maternal chronic hypertension, third trimester   2. [redacted] weeks gestation of pregnancy      Plan:   Results reviewed and discussed with patient by  Tresea Mall, CNM.     Rocco Serene, LPN

## 2023-05-09 ENCOUNTER — Ambulatory Visit (INDEPENDENT_AMBULATORY_CARE_PROVIDER_SITE_OTHER): Payer: Managed Care, Other (non HMO)

## 2023-05-09 ENCOUNTER — Ambulatory Visit (INDEPENDENT_AMBULATORY_CARE_PROVIDER_SITE_OTHER): Payer: Medicaid Other | Admitting: Advanced Practice Midwife

## 2023-05-09 VITALS — BP 135/86 | HR 80 | Ht 65.0 in | Wt 199.0 lb

## 2023-05-09 VITALS — BP 135/86 | HR 80 | Wt 199.0 lb

## 2023-05-09 DIAGNOSIS — Z3A38 38 weeks gestation of pregnancy: Secondary | ICD-10-CM | POA: Diagnosis not present

## 2023-05-09 DIAGNOSIS — O10013 Pre-existing essential hypertension complicating pregnancy, third trimester: Secondary | ICD-10-CM

## 2023-05-09 DIAGNOSIS — O10913 Unspecified pre-existing hypertension complicating pregnancy, third trimester: Secondary | ICD-10-CM

## 2023-05-09 DIAGNOSIS — O10019 Pre-existing essential hypertension complicating pregnancy, unspecified trimester: Secondary | ICD-10-CM

## 2023-05-09 DIAGNOSIS — O0993 Supervision of high risk pregnancy, unspecified, third trimester: Secondary | ICD-10-CM

## 2023-05-09 LAB — POCT URINALYSIS DIPSTICK OB
Bilirubin, UA: NEGATIVE
Blood, UA: NEGATIVE
Glucose, UA: NEGATIVE
Ketones, UA: NEGATIVE
Leukocytes, UA: NEGATIVE
Nitrite, UA: NEGATIVE
POC,PROTEIN,UA: NEGATIVE
Spec Grav, UA: 1.01 (ref 1.010–1.025)
Urobilinogen, UA: 0.2 E.U./dL
pH, UA: 6.5 (ref 5.0–8.0)

## 2023-05-09 NOTE — Progress Notes (Signed)
Routine Prenatal Care Visit  Subjective  Lydia Kennedy is a 32 y.o. G2P1001 at [redacted]w[redacted]d being seen today for ongoing prenatal care.  She is currently monitored for the following issues for this high-risk pregnancy and has Generalized anxiety disorder; Other somatoform disorders; Gastroesophageal reflux disease without esophagitis; Acute gastritis without hemorrhage; Supervision of high-risk pregnancy; Essential hypertension; History of cesarean delivery affecting pregnancy; History of kidney problems; Pre-existing hypertension during pregnancy, antepartum; Excessive weight gain during pregnancy in third trimester; ADHD; Thyroid nodule; Maternal chronic hypertension, third trimester; and [redacted] weeks gestation of pregnancy on their problem list.  ----------------------------------------------------------------------------------- Patient reports no complaints.  She asked about her lab work from 3 days ago. Reviewed negative for preeclampsia. Contractions: Irregular. Vag. Bleeding: None.  Movement: Present. Leaking Fluid denies.  ----------------------------------------------------------------------------------- The following portions of the patient's history were reviewed and updated as appropriate: allergies, current medications, past family history, past medical history, past social history, past surgical history and problem list. Problem list updated.  Objective  Blood pressure 135/86, pulse 80, weight 199 lb (90.3 kg), last menstrual period 08/02/2022. Pregravid weight 126 lb (57.2 kg) Total Weight Gain 73 lb (33.1 kg) Urinalysis: Urine Protein    Urine Glucose    Fetal Status: Fetal Heart Rate (bpm): 130   Movement: Present     NST: reactive 20 minute tracing, 130 bpm, moderate variability, +accelerations, -decelerations  General:  Alert, oriented and cooperative. Patient is in no acute distress.  Skin: Skin is warm and dry. No rash noted.   Cardiovascular: Normal heart rate noted   Respiratory: Normal respiratory effort, no problems with respiration noted  Abdomen: Soft, gravid, appropriate for gestational age. Pain/Pressure: Present     Pelvic:  Cervical exam deferred        Extremities: Normal range of motion.  Edema: Trace  Mental Status: Normal mood and affect. Normal behavior. Normal judgment and thought content.   Assessment   32 y.o. G2P1001 at [redacted]w[redacted]d by  05/18/2023, by Ultrasound presenting for routine prenatal visit  Plan   G2 Problems (from 11/22/22 to present)     Problem Noted Resolved   Supervision of high-risk pregnancy 10/12/2022 by Loney Laurence, CMA No   Overview Addendum 04/22/2023 11:18 AM by Tommie Raymond, CMA     Clinical Staff Provider  Office Location  [facility] Dating  Not found.  Language  english Anatomy US    Flu Vaccine  Declined Genetic Screen  NIPS: Negative/Female: 10/19/22  TDaP vaccine   Declined  Hgb A1C or  GTT Early : Third trimester : 121 (03/06/2023)  Covid Declined    LAB RESULTS   Rhogam  A/Positive/-- (10/27 1610)  Blood Type A/Positive/-- (10/27 0859)   Feeding Plan Bottle/breast if able Antibody Negative (10/27 0859)  Contraception  Rubella 5.26 (10/27 0859)  Circumcision IF FEMALE YES RPR Non Reactive (10/27 0859)   Pediatrician   HBsAg Negative (10/27 0859)   Support Person Significant other HIV Non Reactive (10/27 0859)  Prenatal Classes  Varicella     GBS  (For PCN allergy, check sensitivities)   BTL Consent  Hep C Non Reactive (10/27 0859)   VBAC Consent  Pap Diagnosis  Date Value Ref Range Status  07/06/2020   Final   - Negative for intraepithelial lesion or malignancy (NILM)      Hgb Electro      CF      SMA  Term labor symptoms and general obstetric precautions including but not limited to vaginal bleeding, contractions, leaking of fluid and fetal movement were reviewed in detail with the patient. Please refer to After Visit Summary for other counseling  recommendations.   Return for c/section on 5/20.  Tresea Mall, CNM 05/09/2023 2:12 PM

## 2023-05-09 NOTE — Addendum Note (Signed)
Addended by: Cornelius Moras D on: 05/09/2023 02:19 PM   Modules accepted: Orders

## 2023-05-10 ENCOUNTER — Ambulatory Visit: Payer: Medicaid Other | Admitting: Skilled Nursing Facility1

## 2023-05-10 ENCOUNTER — Encounter
Admission: RE | Admit: 2023-05-10 | Discharge: 2023-05-10 | Disposition: A | Discharge status: Home / Self Care | Payer: Managed Care, Other (non HMO) | Source: Ambulatory Visit | Attending: Obstetrics and Gynecology | Admitting: Obstetrics and Gynecology

## 2023-05-10 DIAGNOSIS — Z01812 Encounter for preprocedural laboratory examination: Secondary | ICD-10-CM | POA: Diagnosis present

## 2023-05-10 DIAGNOSIS — Z3A35 35 weeks gestation of pregnancy: Secondary | ICD-10-CM

## 2023-05-10 LAB — CBC
HCT: 36.3 % (ref 36.0–46.0)
Hemoglobin: 12.5 g/dL (ref 12.0–15.0)
MCH: 30.3 pg (ref 26.0–34.0)
MCHC: 34.4 g/dL (ref 30.0–36.0)
MCV: 88.1 fL (ref 80.0–100.0)
Platelets: 213 10*3/uL (ref 150–400)
RBC: 4.12 MIL/uL (ref 3.87–5.11)
RDW: 13 % (ref 11.5–15.5)
WBC: 6.9 10*3/uL (ref 4.0–10.5)
nRBC: 0 % (ref 0.0–0.2)

## 2023-05-10 LAB — TYPE AND SCREEN
ABO/RH(D): A POS
Antibody Screen: NEGATIVE
Extend sample reason: UNDETERMINED

## 2023-05-11 LAB — RPR: RPR Ser Ql: NONREACTIVE

## 2023-05-13 ENCOUNTER — Inpatient Hospital Stay
Admission: RE | Admit: 2023-05-13 | Discharge: 2023-05-16 | DRG: 787 | Disposition: A | Discharge status: Home / Self Care | Payer: Managed Care, Other (non HMO) | Source: Ambulatory Visit | Attending: Obstetrics and Gynecology | Admitting: Obstetrics and Gynecology

## 2023-05-13 ENCOUNTER — Inpatient Hospital Stay: Payer: Managed Care, Other (non HMO) | Admitting: Urgent Care

## 2023-05-13 ENCOUNTER — Other Ambulatory Visit: Payer: Self-pay

## 2023-05-13 ENCOUNTER — Encounter: Payer: Self-pay | Admitting: Obstetrics and Gynecology

## 2023-05-13 ENCOUNTER — Encounter
Admission: RE | Disposition: A | Discharge status: Home / Self Care | Payer: Self-pay | Source: Ambulatory Visit | Attending: Obstetrics and Gynecology

## 2023-05-13 DIAGNOSIS — F909 Attention-deficit hyperactivity disorder, unspecified type: Secondary | ICD-10-CM

## 2023-05-13 DIAGNOSIS — O9903 Anemia complicating the puerperium: Secondary | ICD-10-CM

## 2023-05-13 DIAGNOSIS — F419 Anxiety disorder, unspecified: Secondary | ICD-10-CM | POA: Diagnosis present

## 2023-05-13 DIAGNOSIS — Z3A39 39 weeks gestation of pregnancy: Secondary | ICD-10-CM | POA: Diagnosis not present

## 2023-05-13 DIAGNOSIS — O1002 Pre-existing essential hypertension complicating childbirth: Secondary | ICD-10-CM

## 2023-05-13 DIAGNOSIS — D62 Acute posthemorrhagic anemia: Secondary | ICD-10-CM | POA: Diagnosis not present

## 2023-05-13 DIAGNOSIS — Z3490 Encounter for supervision of normal pregnancy, unspecified, unspecified trimester: Principal | ICD-10-CM

## 2023-05-13 DIAGNOSIS — Z98891 History of uterine scar from previous surgery: Principal | ICD-10-CM

## 2023-05-13 DIAGNOSIS — O34211 Maternal care for low transverse scar from previous cesarean delivery: Secondary | ICD-10-CM | POA: Diagnosis present

## 2023-05-13 DIAGNOSIS — O1092 Unspecified pre-existing hypertension complicating childbirth: Secondary | ICD-10-CM | POA: Diagnosis present

## 2023-05-13 DIAGNOSIS — O9081 Anemia of the puerperium: Secondary | ICD-10-CM | POA: Diagnosis not present

## 2023-05-13 DIAGNOSIS — O99344 Other mental disorders complicating childbirth: Secondary | ICD-10-CM

## 2023-05-13 HISTORY — DX: Encounter for supervision of normal pregnancy, unspecified, unspecified trimester: Z34.90

## 2023-05-13 LAB — ABO/RH: ABO/RH(D): A POS

## 2023-05-13 SURGERY — Surgical Case
Anesthesia: Spinal

## 2023-05-13 MED ORDER — SOD CITRATE-CITRIC ACID 500-334 MG/5ML PO SOLN
ORAL | Status: AC
  Administered 2023-05-13: 30 mL
  Filled 2023-05-13: qty 15

## 2023-05-13 MED ORDER — FAMOTIDINE 20 MG PO TABS
20.0000 mg | ORAL_TABLET | Freq: Once | ORAL | Status: AC
  Administered 2023-05-13: 20 mg via ORAL
  Filled 2023-05-13: qty 1

## 2023-05-13 MED ORDER — ACETAMINOPHEN 325 MG PO TABS
650.0000 mg | ORAL_TABLET | ORAL | Status: DC | PRN
  Administered 2023-05-13 – 2023-05-16 (×8): 650 mg via ORAL
  Filled 2023-05-13 (×8): qty 2

## 2023-05-13 MED ORDER — METHYLERGONOVINE MALEATE 0.2 MG/ML IJ SOLN
INTRAMUSCULAR | Status: AC
  Filled 2023-05-13: qty 1

## 2023-05-13 MED ORDER — CLINDAMYCIN PHOSPHATE 900 MG/50ML IV SOLN
900.0000 mg | INTRAVENOUS | Status: AC
  Administered 2023-05-13: 900 mg via INTRAVENOUS
  Filled 2023-05-13: qty 50

## 2023-05-13 MED ORDER — GABAPENTIN 300 MG PO CAPS
300.0000 mg | ORAL_CAPSULE | Freq: Every day | ORAL | Status: DC
  Administered 2023-05-13 – 2023-05-16 (×4): 300 mg via ORAL
  Filled 2023-05-13 (×4): qty 1

## 2023-05-13 MED ORDER — LACTATED RINGERS IV SOLN: INTRAVENOUS | Status: DC

## 2023-05-13 MED ORDER — OXYTOCIN-SODIUM CHLORIDE 30-0.9 UT/500ML-% IV SOLN
INTRAVENOUS | Status: AC
  Filled 2023-05-13: qty 500

## 2023-05-13 MED ORDER — CHLORHEXIDINE GLUCONATE 0.12 % MT SOLN
15.0000 mL | Freq: Once | OROMUCOSAL | Status: AC
  Administered 2023-05-13: 15 mL via OROMUCOSAL
  Filled 2023-05-13: qty 15

## 2023-05-13 MED ORDER — IBUPROFEN 600 MG PO TABS
600.0000 mg | ORAL_TABLET | Freq: Four times a day (QID) | ORAL | Status: DC
  Administered 2023-05-15 – 2023-05-16 (×7): 600 mg via ORAL
  Filled 2023-05-13 (×7): qty 1

## 2023-05-13 MED ORDER — CARBOPROST TROMETHAMINE 250 MCG/ML IM SOLN
INTRAMUSCULAR | Status: AC
  Filled 2023-05-13: qty 1

## 2023-05-13 MED ORDER — IBUPROFEN 600 MG PO TABS
600.0000 mg | ORAL_TABLET | Freq: Four times a day (QID) | ORAL | Status: DC
  Filled 2023-05-13: qty 1

## 2023-05-13 MED ORDER — PRENATAL MULTIVITAMIN CH
1.0000 | ORAL_TABLET | Freq: Every day | ORAL | Status: DC
  Administered 2023-05-14 – 2023-05-16 (×3): 1 via ORAL
  Filled 2023-05-13 (×3): qty 1

## 2023-05-13 MED ORDER — METHYLERGONOVINE MALEATE 0.2 MG PO TABS: 0.2000 mg | ORAL_TABLET | ORAL | Status: DC | PRN

## 2023-05-13 MED ORDER — ONDANSETRON HCL 4 MG/2ML IJ SOLN: 4.0000 mg | Freq: Three times a day (TID) | INTRAMUSCULAR | Status: DC | PRN

## 2023-05-13 MED ORDER — OXYCODONE HCL 5 MG/5ML PO SOLN: 5.0000 mg | Freq: Once | ORAL | Status: DC | PRN

## 2023-05-13 MED ORDER — MORPHINE SULFATE (PF) 0.5 MG/ML IJ SOLN
INTRAMUSCULAR | Status: DC | PRN
  Administered 2023-05-13: .1 mg via INTRATHECAL

## 2023-05-13 MED ORDER — FENTANYL CITRATE (PF) 100 MCG/2ML IJ SOLN
INTRAMUSCULAR | Status: DC | PRN
  Administered 2023-05-13: 15 ug via INTRATHECAL

## 2023-05-13 MED ORDER — LACTATED RINGERS IV SOLN: Freq: Once | INTRAVENOUS | Status: AC

## 2023-05-13 MED ORDER — KETOROLAC TROMETHAMINE 30 MG/ML IJ SOLN: 30.0000 mg | Freq: Four times a day (QID) | INTRAMUSCULAR | Status: AC | PRN

## 2023-05-13 MED ORDER — PHENYLEPHRINE HCL-NACL 20-0.9 MG/250ML-% IV SOLN
INTRAVENOUS | Status: DC | PRN
  Administered 2023-05-13: 50 ug/min via INTRAVENOUS

## 2023-05-13 MED ORDER — FENTANYL CITRATE (PF) 100 MCG/2ML IJ SOLN
INTRAMUSCULAR | Status: AC
  Filled 2023-05-13: qty 2

## 2023-05-13 MED ORDER — WITCH HAZEL-GLYCERIN EX PADS: 1.0000 | MEDICATED_PAD | CUTANEOUS | Status: DC | PRN

## 2023-05-13 MED ORDER — METHYLERGONOVINE MALEATE 0.2 MG/ML IJ SOLN: 0.2000 mg | INTRAMUSCULAR | Status: DC | PRN

## 2023-05-13 MED ORDER — SODIUM CHLORIDE 0.9% FLUSH: 3.0000 mL | INTRAVENOUS | Status: DC | PRN

## 2023-05-13 MED ORDER — MENTHOL 3 MG MT LOZG: 1.0000 | LOZENGE | OROMUCOSAL | Status: DC | PRN

## 2023-05-13 MED ORDER — ORAL CARE MOUTH RINSE: 15.0000 mL | Freq: Once | OROMUCOSAL | Status: AC

## 2023-05-13 MED ORDER — ACETAMINOPHEN 500 MG PO TABS
1000.0000 mg | ORAL_TABLET | Freq: Once | ORAL | Status: AC
  Administered 2023-05-13: 1000 mg via ORAL
  Filled 2023-05-13: qty 2

## 2023-05-13 MED ORDER — DIPHENHYDRAMINE HCL 25 MG PO CAPS: 25.0000 mg | ORAL_CAPSULE | Freq: Four times a day (QID) | ORAL | Status: DC | PRN

## 2023-05-13 MED ORDER — GENTAMICIN SULFATE 40 MG/ML IJ SOLN
5.0000 mg/kg | INTRAVENOUS | Status: AC
  Administered 2023-05-13: 440 mg via INTRAVENOUS
  Filled 2023-05-13: qty 11

## 2023-05-13 MED ORDER — BUPIVACAINE IN DEXTROSE 0.75-8.25 % IT SOLN
INTRATHECAL | Status: DC | PRN
  Administered 2023-05-13: 1.5 mL via INTRATHECAL

## 2023-05-13 MED ORDER — ONDANSETRON HCL 4 MG/2ML IJ SOLN
INTRAMUSCULAR | Status: DC | PRN
  Administered 2023-05-13: 4 mg via INTRAVENOUS

## 2023-05-13 MED ORDER — KETOROLAC TROMETHAMINE 30 MG/ML IJ SOLN
30.0000 mg | Freq: Four times a day (QID) | INTRAMUSCULAR | Status: AC
  Administered 2023-05-14 (×3): 30 mg via INTRAVENOUS
  Filled 2023-05-13 (×4): qty 1

## 2023-05-13 MED ORDER — SCOPOLAMINE 1 MG/3DAYS TD PT72: 1.0000 | MEDICATED_PATCH | Freq: Once | TRANSDERMAL | Status: DC

## 2023-05-13 MED ORDER — OXYTOCIN-SODIUM CHLORIDE 30-0.9 UT/500ML-% IV SOLN
INTRAVENOUS | Status: DC | PRN
  Administered 2023-05-13: 30 [IU] via INTRAVENOUS

## 2023-05-13 MED ORDER — OXYCODONE HCL 5 MG PO TABS: 5.0000 mg | ORAL_TABLET | Freq: Once | ORAL | Status: DC | PRN

## 2023-05-13 MED ORDER — MORPHINE SULFATE (PF) 0.5 MG/ML IJ SOLN
INTRAMUSCULAR | Status: AC
  Filled 2023-05-13: qty 10

## 2023-05-13 MED ORDER — OXYTOCIN-SODIUM CHLORIDE 30-0.9 UT/500ML-% IV SOLN: 2.5000 [IU]/h | INTRAVENOUS | Status: AC

## 2023-05-13 MED ORDER — KETOROLAC TROMETHAMINE 30 MG/ML IJ SOLN
INTRAMUSCULAR | Status: DC | PRN
  Administered 2023-05-13: 30 mg via INTRAVENOUS

## 2023-05-13 MED ORDER — LIDOCAINE 5 % EX PTCH
MEDICATED_PATCH | CUTANEOUS | Status: DC | PRN
  Administered 2023-05-13: 1 via TRANSDERMAL

## 2023-05-13 MED ORDER — ZOLPIDEM TARTRATE 5 MG PO TABS: 5.0000 mg | ORAL_TABLET | Freq: Every evening | ORAL | Status: DC | PRN

## 2023-05-13 MED ORDER — POVIDONE-IODINE 10 % EX SWAB
2.0000 | Freq: Once | CUTANEOUS | Status: AC
  Administered 2023-05-13: 2 via TOPICAL

## 2023-05-13 MED ORDER — NALOXONE HCL 4 MG/10ML IJ SOLN
1.0000 ug/kg/h | INTRAVENOUS | Status: DC | PRN
  Filled 2023-05-13: qty 5

## 2023-05-13 MED ORDER — SIMETHICONE 80 MG PO CHEW
80.0000 mg | CHEWABLE_TABLET | Freq: Four times a day (QID) | ORAL | Status: DC
  Administered 2023-05-13 – 2023-05-16 (×14): 80 mg via ORAL
  Filled 2023-05-13 (×14): qty 1

## 2023-05-13 MED ORDER — DIBUCAINE (PERIANAL) 1 % EX OINT: 1.0000 | TOPICAL_OINTMENT | CUTANEOUS | Status: DC | PRN

## 2023-05-13 MED ORDER — NALOXONE HCL 0.4 MG/ML IJ SOLN: 0.4000 mg | INTRAMUSCULAR | Status: DC | PRN

## 2023-05-13 MED ORDER — KETOROLAC TROMETHAMINE 30 MG/ML IJ SOLN
30.0000 mg | Freq: Four times a day (QID) | INTRAMUSCULAR | Status: AC | PRN
  Administered 2023-05-13 – 2023-05-14 (×2): 30 mg via INTRAVENOUS
  Filled 2023-05-13: qty 1

## 2023-05-13 MED ORDER — SENNOSIDES-DOCUSATE SODIUM 8.6-50 MG PO TABS
2.0000 | ORAL_TABLET | ORAL | Status: DC
  Administered 2023-05-13 – 2023-05-16 (×4): 2 via ORAL
  Filled 2023-05-13 (×4): qty 2

## 2023-05-13 MED ORDER — FENTANYL CITRATE (PF) 100 MCG/2ML IJ SOLN: 25.0000 ug | INTRAMUSCULAR | Status: DC | PRN

## 2023-05-13 MED ORDER — TRANEXAMIC ACID-NACL 1000-0.7 MG/100ML-% IV SOLN
INTRAVENOUS | Status: AC
  Filled 2023-05-13: qty 100

## 2023-05-13 MED ORDER — OXYCODONE-ACETAMINOPHEN 5-325 MG PO TABS
1.0000 | ORAL_TABLET | ORAL | Status: DC | PRN
  Administered 2023-05-13 (×2): 1 via ORAL
  Administered 2023-05-14: 2 via ORAL
  Administered 2023-05-14: 1 via ORAL
  Administered 2023-05-14: 2 via ORAL
  Administered 2023-05-14: 1 via ORAL
  Administered 2023-05-14 – 2023-05-15 (×2): 2 via ORAL
  Filled 2023-05-13: qty 2
  Filled 2023-05-13: qty 1
  Filled 2023-05-13: qty 2
  Filled 2023-05-13: qty 1
  Filled 2023-05-13 (×3): qty 2
  Filled 2023-05-13: qty 1

## 2023-05-13 MED ORDER — ALPRAZOLAM 0.5 MG PO TABS
2.0000 mg | ORAL_TABLET | Freq: Three times a day (TID) | ORAL | Status: DC | PRN
  Administered 2023-05-13 – 2023-05-16 (×6): 2 mg via ORAL
  Filled 2023-05-13 (×6): qty 4

## 2023-05-13 MED ORDER — LIDOCAINE 5 % EX PTCH
MEDICATED_PATCH | CUTANEOUS | Status: AC
  Filled 2023-05-13: qty 1

## 2023-05-13 SURGICAL SUPPLY — 28 items
ADH LQ OCL WTPRF AMP STRL LF (MISCELLANEOUS) ×1
ADHESIVE MASTISOL STRL (MISCELLANEOUS) ×1 IMPLANT
APL PRP STRL LF DISP 70% ISPRP (MISCELLANEOUS) ×2
BAG COUNTER SPONGE SURGICOUNT (BAG) ×1 IMPLANT
BAG SPNG CNTER NS LX DISP (BAG) ×1
CHLORAPREP W/TINT 26 (MISCELLANEOUS) ×2 IMPLANT
DRSG TELFA 3X8 NADH STRL (GAUZE/BANDAGES/DRESSINGS) ×1 IMPLANT
GAUZE SPONGE 4X4 12PLY STRL (GAUZE/BANDAGES/DRESSINGS) ×1 IMPLANT
GLOVE PI ORTHO PRO STRL 7.5 (GLOVE) ×1 IMPLANT
GOWN STRL REUS W/ TWL LRG LVL3 (GOWN DISPOSABLE) ×2 IMPLANT
GOWN STRL REUS W/TWL LRG LVL3 (GOWN DISPOSABLE) ×2
KIT TURNOVER KIT A (KITS) ×1 IMPLANT
MANIFOLD NEPTUNE II (INSTRUMENTS) ×1 IMPLANT
MAT PREVALON FULL STRYKER (MISCELLANEOUS) ×1 IMPLANT
NS IRRIG 1000ML POUR BTL (IV SOLUTION) ×1 IMPLANT
PACK C SECTION AR (MISCELLANEOUS) ×1 IMPLANT
PAD OB MATERNITY 4.3X12.25 (PERSONAL CARE ITEMS) ×1 IMPLANT
PAD PREP OB/GYN DISP 24X41 (PERSONAL CARE ITEMS) ×1 IMPLANT
RETRACTOR WND ALEXIS-O 25 LRG (MISCELLANEOUS) ×1 IMPLANT
RTRCTR WOUND ALEXIS O 25CM LRG (MISCELLANEOUS) ×1
SCRUB CHG 4% DYNA-HEX 4OZ (MISCELLANEOUS) ×1 IMPLANT
SPONGE T-LAP 18X18 ~~LOC~~+RFID (SPONGE) ×1 IMPLANT
SUT VIC AB 0 CTX 36 (SUTURE) ×2
SUT VIC AB 0 CTX36XBRD ANBCTRL (SUTURE) ×2 IMPLANT
SUT VIC AB 1 CT1 36 (SUTURE) ×2 IMPLANT
SUT VICRYL+ 3-0 36IN CT-1 (SUTURE) ×2 IMPLANT
TRAP FLUID SMOKE EVACUATOR (MISCELLANEOUS) ×1 IMPLANT
WATER STERILE IRR 500ML POUR (IV SOLUTION) ×1 IMPLANT

## 2023-05-13 NOTE — Lactation Note (Addendum)
This note was copied from a baby's chart. Lactation Consultation Note  Patient Name: Lydia Kennedy [id number] Date: 05/13/2023 Age:32 hours Reason for consult: L&D Initial assessment   Maternal Data Has patient been taught Hand Expression?: Yes Does the patient have breastfeeding experience prior to this delivery?: Yes How long did the patient breastfeed?: attempted x2 wks HX of breast augmentation in 2016, axillary incision, sub-muscular placement  Feeding Mother's Current Feeding Choice: Breast Milk Several attempts made to latch baby per transition nurses, and then by myself, mom has sl flat nipples and firm area behind areola, making breast difficult to shape for deep latch,  DEBP pump kit obtained, manual pump initiated to lengthen nipples and soften areola, mom can easily hand express colostrum, baby was then able to latch to left breast in cradle hold with breast shaping and compression and nursed x 10 min, baby came off breast and then transferred to right breast in cradle hold, manual pump used again to lenghten nipples and soften areola area, approx teaspoon colostrum obtained, baby able to latch easily with breast compression, became sleepy after 5 min of nursing and came off breast. Some swallows noted LATCH Score Latch: Repeated attempts needed to sustain latch, nipple held in mouth throughout feeding, stimulation needed to elicit sucking reflex.  Audible Swallowing: A few with stimulation  Type of Nipple: Flat (nipples evert but sl flat)  Comfort (Breast/Nipple): Filling, red/small blisters or bruises, mild/mod discomfort (breasts firm due to hx of breast augmentation)  Hold (Positioning): Assistance needed to correctly position infant at breast and maintain latch.  LATCH Score: 5   Lactation Tools Discussed/Used Tools: Pump Breast pump type: Manual Reason for Pumping: to elongate nipples and soften areola Pumping frequency: pre pump Pumped volume:   (teaspoon)  Interventions Interventions: Breast feeding basics reviewed;Assisted with latch;Skin to skin;Hand express;Pre-pump if needed;Breast compression;Adjust position;Support pillows;Hand pump;Education  Discharge Pump: Manual;DEBP WIC Program: No  Consult Status Consult Status: Follow-up from L&D    Dyann Kief 05/13/2023, 2:46 PM

## 2023-05-13 NOTE — H&P (Signed)
History and Physical   HPI  Lydia Kennedy is a 32 y.o. G2P1001 at [redacted]w[redacted]d Estimated Date of Delivery: 05/18/23 who is being admitted for C-section repeat.  Her pregnancy hs been complicated by chronic HTN   OB History  OB History  Gravida Para Term Preterm AB Living  2 1 1  0 0 1  SAB IAB Ectopic Multiple Live Births  0 0 0 0 1    # Outcome Date GA Lbr Len/2nd Weight Sex Delivery Anes PTL Lv  2 Current           1 Term 07/12/11 [redacted]w[redacted]d  3600 g F CS-LTranv   LIV     Complications: Failure to Progress in Second Stage     Name: zayli    PROBLEM LIST  Pregnancy complications or risks: Patient Active Problem List   Diagnosis Date Noted   Pregnancy 05/13/2023   [redacted] weeks gestation of pregnancy 05/08/2023   Maternal chronic hypertension, third trimester 05/01/2023   Pre-existing hypertension during pregnancy, antepartum 03/06/2023   Excessive weight gain during pregnancy in third trimester 03/06/2023   History of kidney problems 11/23/2022   History of cesarean delivery affecting pregnancy 11/04/2022   Supervision of high-risk pregnancy 10/12/2022   Essential hypertension 10/24/2021   ADHD 10/24/2021   Thyroid nodule 09/08/2021   Other somatoform disorders    Gastroesophageal reflux disease without esophagitis    Acute gastritis without hemorrhage    Generalized anxiety disorder 05/04/2016    Prenatal labs and studies: ABO, Rh: --/--/A POS Performed at [facility], [address] [place], [place] [address]  [phone]) Antibody: NEG (05/17 0902) Rubella: 5.26 (10/27 0859) RPR: NON REACTIVE (05/17 0902)  HBsAg: Negative (10/27 0859)  HIV: Non Reactive (03/13 1007)  GBS:--/Negative (04/23 0946)   Past Medical History:  Diagnosis Date   Acute gastritis without hemorrhage    ADHD    Anemia    Anxiety    Chronic kidney disease    kidney reflux, problem more as child/ recuurent interstitial cystitis   Essential hypertension    Family history of  breast cancer    7/21 cancer genetic testing letter sent   GERD (gastroesophageal reflux disease)    Headache    daily   IC (interstitial cystitis)    Infection    UTI   Labor and delivery, indication for care 03/29/2023   Thyroid dysfunction    Thyroid nodule    Tonsillitis      Past Surgical History:  Procedure Laterality Date   BREAST ENHANCEMENT SURGERY Bilateral 2016   CESAREAN SECTION  2012   ESOPHAGOGASTRODUODENOSCOPY (EGD) WITH PROPOFOL N/A 07/07/2018   Procedure: ESOPHAGOGASTRODUODENOSCOPY (EGD) WITH PROPOFOL;  Surgeon: Midge Minium, MD;  Location: [facility];  Service: Endoscopy;  Laterality: N/A;   TONSILLECTOMY N/A 01/29/2018   Procedure: TONSILLECTOMY;  Surgeon: Bud Face, MD;  Location: [facility];  Service: ENT;  Laterality: N/A;  UPREG     Medications    Current Discharge Medication List     CONTINUE these medications which have NOT CHANGED   Details  alprazolam (XANAX) 2 MG tablet Take 2 mg by mouth 3 (three) times daily as needed for anxiety.    butalbital-acetaminophen-caffeine (FIORICET) 50-325-40 MG tablet Take 1-2 tablets by mouth every 6 (six) hours as needed for headache. Qty: 20 tablet, Refills: 0   Associated Diagnoses: Nonintractable headache, unspecified chronicity pattern, unspecified headache type    NIFEdipine (PROCARDIA-XL/NIFEDICAL-XL) 30 MG 24 hr tablet Take 1  tablet (30 mg total) by mouth daily. Can increase to twice a day as needed for symptomatic contractions Qty: 30 tablet, Refills: 2   Associated Diagnoses: Primary hypertension    Prenatal Vit-Fe Fumarate-FA (WESTAB PLUS) 27-1 MG TABS TAKE 1 TABLET BY MOUTH EVERY DAY Qty: 90 tablet, Refills: 1   Comments: **Patient requests 90 days supply** Associated Diagnoses: [redacted] weeks gestation of pregnancy         Allergies  Cephalexin, Elemental sulfur, Penicillins, Sulfa antibiotics, and Chlorhexidine gluconate  Review of Systems  Pertinent items noted in  HPI and remainder of comprehensive ROS otherwise negative.  Physical Exam  BP 121/79   Pulse 83   Temp 97.9 F (36.6 C) (Oral)   Resp 15   Ht 5\' 5"  (1.651 m)   Wt 89.8 kg   LMP 08/02/2022 (Exact Date)   BMI 32.95 kg/m   Lungs:  CTA B Cardio: RRR without M/R/G Abd: Soft, gravid, NT Presentation: cephalic EXT: No C/C/ 1+ Edema DTRs: 2+ B CERVIX:    See Prenatal records for more detailed PE.     FHR:  Variability: Good {> 6 bpm)  Toco: Uterine Contractions: None  Test Results  Results for orders placed or performed during the hospital encounter of 05/13/23 (from the past 24 hour(s))  ABO/Rh     Status: None   Collection Time: 05/13/23  6:03 AM  Result Value Ref Range   ABO/RH(D)      A POS Performed at [facility], [address], [place], [place] [address]    Group B Strep negative  Assessment   G2P1001 at [redacted]w[redacted]d Estimated Date of Delivery: 05/18/23  The fetus is reassuring.   Patient Active Problem List   Diagnosis Date Noted   Pregnancy 05/13/2023   [redacted] weeks gestation of pregnancy 05/08/2023   Maternal chronic hypertension, third trimester 05/01/2023   Pre-existing hypertension during pregnancy, antepartum 03/06/2023   Excessive weight gain during pregnancy in third trimester 03/06/2023   History of kidney problems 11/23/2022   History of cesarean delivery affecting pregnancy 11/04/2022   Supervision of high-risk pregnancy 10/12/2022   Essential hypertension 10/24/2021   ADHD 10/24/2021   Thyroid nodule 09/08/2021   Other somatoform disorders    Gastroesophageal reflux disease without esophagitis    Acute gastritis without hemorrhage    Generalized anxiety disorder 05/04/2016    Plan  1. Admit to L&D :   2. EFM: -- Category 1 3. Repeat CD  Elonda Husky, M.D. 05/13/2023 7:40 AM

## 2023-05-13 NOTE — Lactation Note (Signed)
This note was copied from a baby's chart. Lactation Consultation Note  Patient Name: Girl Khaniya Kust NFAOZ'H Date: 05/13/2023 Age:32 hours Reason for consult: Follow-up assessment   Maternal Data Has patient been taught Hand Expression?: Yes Does the patient have breastfeeding experience prior to this delivery?: Yes How long did the patient breastfeed?: attempted x2 wks  Feeding Mother's Current Feeding Choice: Breast Milk Assisted with latch to right breast in cradle hold, pre pumped first to more evert nipple and soften breast, baby latched well with shaping breast and gentle chin pressure to widen latch, nursed x 10 min with occ swallows, fell asleep at breast LATCH Score Latch: Grasps breast easily, tongue down, lips flanged, rhythmical sucking.  Audible Swallowing: A few with stimulation  Type of Nipple: Flat (everts with stim)  Comfort (Breast/Nipple): Soft / non-tender  Hold (Positioning): Assistance needed to correctly position infant at breast and maintain latch.  LATCH Score: 7   Lactation Tools Discussed/Used Tools: Pump Breast pump type: Double-Electric Breast Pump Reason for Pumping: prepump Pumping frequency: pre pump Pumped volume:  (teaspoon)  Interventions Interventions: Pre-pump if needed;Assisted with latch;Skin to skin;Breast compression;Support pillows;Education LC name and no written on white board Discharge Pump: Manual;DEBP WIC Program: No  Consult Status Consult Status: Follow-up Date: 05/14/23 Follow-up type: In-patient    Dyann Kief 05/13/2023, 5:57 PM

## 2023-05-13 NOTE — Transfer of Care (Signed)
Immediate Anesthesia Transfer of Care Note  Patient: Lydia Kennedy  Procedure(s) Performed: REPEAT CESAREAN SECTION  Patient Location: PACU  Anesthesia Type:Spinal  Level of Consciousness: awake, alert , and oriented  Airway & Oxygen Therapy: Patient Spontanous Breathing  Post-op Assessment: Report given to RN and Post -op Vital signs reviewed and stable  Post vital signs: Reviewed and stable  Last Vitals:  Vitals Value Taken Time  BP 111/67 05/13/23 1303  Temp    Pulse 94 05/13/23 1303  Resp 12   SpO2 98 % 05/13/23 1303    Last Pain:  Vitals:   05/13/23 1303  TempSrc:   PainSc: 0-No pain         Complications: No notable events documented.

## 2023-05-13 NOTE — Progress Notes (Signed)
Pt state she takes xanax 2 mg TID, confirmed with Dr. Logan Bores . Orders placed.   Doreene Burke, CNM

## 2023-05-13 NOTE — Anesthesia Preprocedure Evaluation (Signed)
Anesthesia Evaluation  Patient identified by MRN, date of birth, ID band Patient awake    Reviewed: Allergy & Precautions, NPO status , Patient's Chart, lab work & pertinent test results  History of Anesthesia Complications Negative for: history of anesthetic complications  Airway Mallampati: III  TM Distance: >3 FB Neck ROM: full    Dental  (+) Chipped   Pulmonary neg pulmonary ROS, neg shortness of breath, former smoker   Pulmonary exam normal        Cardiovascular Exercise Tolerance: Good hypertension, (-) angina (-) Past MI Normal cardiovascular exam     Neuro/Psych neg Headaches PSYCHIATRIC DISORDERS Anxiety        GI/Hepatic Neg liver ROS,GERD  Controlled,,  Endo/Other  negative endocrine ROS    Renal/GU Renal disease     Musculoskeletal   Abdominal   Peds  Hematology negative hematology ROS (+)   Anesthesia Other Findings Past Medical History: No date: Acute gastritis without hemorrhage No date: ADHD No date: Anemia No date: Anxiety No date: Chronic kidney disease     Comment:  kidney reflux, problem more as child/ recuurent               interstitial cystitis No date: Essential hypertension No date: Family history of breast cancer     Comment:  7/21 cancer genetic testing letter sent No date: GERD (gastroesophageal reflux disease) No date: Headache     Comment:  daily No date: IC (interstitial cystitis) No date: Infection     Comment:  UTI 03/29/2023: Labor and delivery, indication for care No date: Thyroid dysfunction No date: Thyroid nodule No date: Tonsillitis  Past Surgical History: 2016: BREAST ENHANCEMENT SURGERY; Bilateral 2012: CESAREAN SECTION 07/07/2018: ESOPHAGOGASTRODUODENOSCOPY (EGD) WITH PROPOFOL; N/A     Comment:  Procedure: ESOPHAGOGASTRODUODENOSCOPY (EGD) WITH               PROPOFOL;  Surgeon: Midge Minium, MD;  Location: [facility];  Service:  Endoscopy;  Laterality: N/A; 01/29/2018: TONSILLECTOMY; N/A     Comment:  Procedure: TONSILLECTOMY;  Surgeon: Bud Face,               MD;  Location: [facility];  Service: ENT;                Laterality: N/A;  UPREG  BMI    Body Mass Index: 32.95 kg/m      Reproductive/Obstetrics negative OB ROS                             Anesthesia Physical Anesthesia Plan  ASA: 3  Anesthesia Plan: Spinal   Post-op Pain Management:    Induction:   PONV Risk Score and Plan:   Airway Management Planned: Natural Airway and Nasal Cannula  Additional Equipment:   Intra-op Plan:   Post-operative Plan:   Informed Consent: I have reviewed the patients History and Physical, chart, labs and discussed the procedure including the risks, benefits and alternatives for the proposed anesthesia with the patient or authorized representative who has indicated his/her understanding and acceptance.     Dental Advisory Given  Plan Discussed with: Anesthesiologist, CRNA and Surgeon  Anesthesia Plan Comments: (Patient reports no bleeding problems and no anticoagulant use.  Plan for spinal with backup GA  Patient consented for risks of anesthesia including but not limited to:  - adverse reactions to medications -  damage to eyes, teeth, lips or other oral mucosa - nerve damage due to positioning  - risk of bleeding, infection and or nerve damage from spinal that could lead to paralysis - risk of headache or failed spinal - damage to teeth, lips or other oral mucosa - sore throat or hoarseness - damage to heart, brain, nerves, lungs, other parts of body or loss of life  Patient voiced understanding.)       Anesthesia Quick Evaluation

## 2023-05-13 NOTE — Anesthesia Procedure Notes (Signed)
Spinal  Patient location during procedure: OR Start time: 05/13/2023 11:40 AM End time: 05/13/2023 11:45 AM Reason for block: surgical anesthesia Staffing Performed: resident/CRNA  Anesthesiologist: Stephanie Coup, MD Resident/CRNA: Karoline Caldwell, CRNA Performed by: Karoline Caldwell, CRNA Authorized by: Stephanie Coup, MD   Preanesthetic Checklist Completed: patient identified, IV checked, site marked, risks and benefits discussed, surgical consent, monitors and equipment checked, pre-op evaluation and timeout performed Spinal Block Patient position: sitting Prep: ChloraPrep Patient monitoring: heart rate, continuous pulse ox, blood pressure and cardiac monitor Approach: midline Location: L3-4 Injection technique: single-shot Needle Needle type: Whitacre and Introducer  Needle gauge: 24 G Needle length: 9 cm Assessment Sensory level: T4 Events: CSF return Additional Notes Sterile aseptic technique used throughout the procedure.  Negative paresthesia. Negative blood return. Positive free-flowing CSF. Expiration date of kit checked and confirmed. Patient tolerated procedure well, without complications.

## 2023-05-13 NOTE — Op Note (Signed)
     OP NOTE  Date: 05/13/2023   1:35 PM Name Lydia Kennedy MR# [id number]  Preoperative Diagnosis: 1. Intrauterine pregnancy at [redacted]w[redacted]d Principal Problem:   Pregnancy Active Problems:   Labor and delivery, indication for care  2.  Repeat - Pt desires   Postoperative Diagnosis: 1. Intrauterine pregnancy at [redacted]w[redacted]d, delivered 2. Viable infant 3. Remainder same as pre-op   Procedure: 1. Repeat Low-Transverse Cesarean Section  Surgeon: Elonda Husky, MD  Assistant:  Thompson CNM  No other capable assistant was available for this surgery which requires an experienced, high level assistant.   Anesthesia: Spinal    EBL: 700  ml     Findings: 1) Female infant, Apgar scores of 8   at 1 minute and 9   at 5 minutes and a birthweight of 111.11  ounces.    2) Normal uterus, tubes and ovaries.    Procedure:  The patient was prepped and draped in the supine position and placed under spinal anesthesia.  A transverse incision was made across the abdomen in a Pfannenstiel manner. If indicated the old scar was systematically removed with sharp dissection.  We carried the dissection down to the level of the fascia.  The fascia was incised in a curvilinear manner.  The fascia was then elevated from the rectus muscles with blunt and sharp dissection.  The rectus muscles were separated laterally exposing the peritoneum.  The peritoneum was carefully entered with care being taken to avoid bowel and bladder.  A self-retaining retractor was placed.  The visceral peritoneum was incised in a curvilinear fashion across the lower uterine segment creating a bladder flap. A transverse incision was made across the lower uterine segment and extended laterally and superiorly using the bandage scissors.  Artificial rupture membranes was performed and None fluid was noted.  The infant was delivered from the cephalic position.  A nuchal cord was not present. After an appropriate time interval, the cord was  doubly clamped and cut. Cord blood was obtained if required.  The infant was handed to the pediatric personnel  who then placed the infant under heat lamps where it was cleaned dried and suctioned as needed. The placenta was delivered. The hysterotomy incision was then identified on ring forceps.  The uterine cavity was cleaned with a moist lap sponge.  The hysterotomy incision was closed with a running interlocking suture of Vicryl.  Hemostasis was excellent.  Pitocin was run in the IV and the uterus was found to be firm. The posterior cul-de-sac and gutters were cleaned and inspected.  Hemostasis was noted.  The fascia was then closed with a running suture of #1 Vicryl.  Hemostasis of the subcutaneous tissues was obtained using the Bovie.  The subcutaneous tissues were closed with a running suture of 000 Vicryl.  A subcuticular suture was placed.  Steri-strips were applied in the usual manner.  A Lidoderm patch was applied.  A pressure dressing was placed.  The patient went to the recovery room in stable condition. Janee Morn CNM provided exposure, dissection, suctioning, retraction, and general support and assistance during the procedure.   Elonda Husky, M.D. 05/13/2023 1:35 PM

## 2023-05-14 ENCOUNTER — Encounter: Payer: Self-pay | Admitting: Obstetrics and Gynecology

## 2023-05-14 ENCOUNTER — Encounter: Payer: Self-pay | Admitting: Advanced Practice Midwife

## 2023-05-14 LAB — CBC
HCT: 24.5 % — ABNORMAL LOW (ref 36.0–46.0)
Hemoglobin: 8.4 g/dL — ABNORMAL LOW (ref 12.0–15.0)
MCH: 30.5 pg (ref 26.0–34.0)
MCHC: 34.3 g/dL (ref 30.0–36.0)
MCV: 89.1 fL (ref 80.0–100.0)
Platelets: 157 10*3/uL (ref 150–400)
RBC: 2.75 MIL/uL — ABNORMAL LOW (ref 3.87–5.11)
RDW: 13.2 % (ref 11.5–15.5)
WBC: 8.1 10*3/uL (ref 4.0–10.5)
nRBC: 0 % (ref 0.0–0.2)

## 2023-05-14 MED ORDER — NIFEDIPINE ER OSMOTIC RELEASE 30 MG PO TB24
30.0000 mg | ORAL_TABLET | Freq: Every day | ORAL | Status: DC
  Administered 2023-05-14 – 2023-05-16 (×3): 30 mg via ORAL
  Filled 2023-05-14 (×3): qty 1

## 2023-05-14 NOTE — Anesthesia Postprocedure Evaluation (Signed)
Anesthesia Post Note  Patient: Lydia Kennedy  Procedure(s) Performed: REPEAT CESAREAN SECTION  Patient location during evaluation: Mother Baby Anesthesia Type: Spinal Level of consciousness: oriented and awake and alert Pain management: pain level controlled Vital Signs Assessment: post-procedure vital signs reviewed and stable Respiratory status: spontaneous breathing and respiratory function stable Cardiovascular status: blood pressure returned to baseline and stable Postop Assessment: no headache, no backache, no apparent nausea or vomiting and able to ambulate Anesthetic complications: no   No notable events documented.   Last Vitals:  Vitals:   05/14/23 0000 05/14/23 0320  BP: 115/70 112/72  Pulse: 85 82  Resp: 18 19  Temp: 36.9 C 36.9 C  SpO2: 99% 100%    Last Pain:  Vitals:   05/14/23 0320  TempSrc: Oral  PainSc: 6                  Malachi Pro C

## 2023-05-14 NOTE — Progress Notes (Addendum)
Progress Note - Cesarean Delivery  Lydia Kennedy is a 32 y.o. G2P2002 now PP day 1 s/p C-Section, Low Transverse .   Subjective:  Patient reports no problems with eating, bowel movements, voiding, or their wound  She is breast-feeding without issue.  The pain is controlled.  She has been out of bed and is voiding without problem.  Objective:  Vital signs in last 24 hours: Temp:  [97.9 F (36.6 C)-98.5 F (36.9 C)] 98.4 F (36.9 C) (05/21 0320) Pulse Rate:  [75-102] 82 (05/21 0320) Resp:  [12-22] 19 (05/21 0320) BP: (104-143)/(63-111) 112/72 (05/21 0320) SpO2:  [95 %-100 %] 100 % (05/21 0320)  Physical Exam:  General: alert, cooperative, and no distress Lochia: appropriate Uterine Fundus: firm Incision: Dressing intact    Data Review Recent Labs    05/14/23 0630  HGB 8.4*  HCT 24.5*    Assessment:  Principal Problem:   Pregnancy Active Problems:   Labor and delivery, indication for care   Status post Cesarean section. Doing well postoperatively.   Hypertension currently controlled with labetalol -follow pressures today  Hemoglobin 8.4 follow-up for signs of anemia  Plan:       Continue current care.  Follow BPs today  Follow-up for symptoms of anemia  Shower and dressing change today  Probable discharge tomorrow  Elonda Husky, M.D. 05/14/2023 6:54 AM

## 2023-05-14 NOTE — Anesthesia Post-op Follow-up Note (Signed)
  Anesthesia Pain Follow-up Note  Patient: Lydia Kennedy  Day #: 1  Date of Follow-up: 05/14/2023 Time: 7:17 AM  Last Vitals:  Vitals:   05/14/23 0000 05/14/23 0320  BP: 115/70 112/72  Pulse: 85 82  Resp: 18 19  Temp: 36.9 C 36.9 C  SpO2: 99% 100%    Level of Consciousness: alert  Pain: none   Side Effects:None  Catheter Site Exam:clean, dry, no drainage     Plan: D/C from anesthesia care at surgeon's request  Rosanne Gutting

## 2023-05-14 NOTE — Clinical Social Work Maternal (Signed)
  CLINICAL SOCIAL WORK MATERNAL/CHILD NOTE  Patient Details  Name: Lydia Kennedy MRN: [id number] Date of Birth: November 30, 1991  Date:  05/14/2023  Clinical Social Worker Initiating Note:  Darolyn Rua, [place] Date/Time: Initiated:  05/14/23/1446     Child's Name:  Lydia Kennedy   Biological Parents:  Mother, Father   Need for Interpreter:  None   Reason for Referral:   (anxiety)   Address:  [address] [place] [address]    Phone number:  [phone] (home)     Additional phone number:   Household Members/Support Persons (HM/SP):       HM/SP Name Relationship DOB or Age  HM/SP -1        HM/SP -2        HM/SP -3        HM/SP -4        HM/SP -5        HM/SP -6        HM/SP -7        HM/SP -8          Natural Supports (not living in the home):  Immediate Family   Professional Supports:     Employment: Full-time   Type of Work: Therapist, sports group   Education:      Homebound arranged:    Surveyor, quantity Resources:      Other Resources:  Food Stamps     Cultural/Religious Considerations Which May Impact Care:    Strengths:      Psychotropic Medications:         Pediatrician:       Pediatrician List:   [facility]      Pediatrician Fax Number: [facility]  Risk Factors/Current Problems:      Cognitive State:  Alert     Mood/Affect:  Calm     CSW Assessment:   TOC consult for anxiety hx and during hospital stay. CSW met with patient and spouse Lydia Kennedy at bedside along with baby Lydia.   Patient reports working full time having food stamps and has another child at home [name] who is 65 years old. Patient reports seeing a psychiatrist every three months, next apt in July. She reports being on 2mg  of Alprazolam. She reports she has been seeing her psychiatrist since 2011, reports she is feeling better and does not need  increase of medications at this time. Is agreeable to anxiety coping skills to be added on AVS. Patient reports having crib and car seat for discharge, states Lydia Kennedy will be going to [name] for follow up appointments. No additional needs identified.   CSW Plan/Description:  No Further Intervention Required/No Barriers to Discharge    Darolyn Rua, LCSW 05/14/2023, 2:48 PM

## 2023-05-14 NOTE — Lactation Note (Signed)
This note was copied from a baby's chart. Lactation Consultation Note  Patient Name: Lydia Kennedy [id number] Date: 05/14/2023 Age:32 hours Reason for consult: Follow-up assessment;1st time breastfeeding;Breast augmentation;Breastfeeding assistance;RN request Lactation to the room for initial visit. Mother is holding the baby at the breast attempting to latch. Baby is frantic and fussy at breast. Taught hand expression and spoon feeding. Mother attempted to latch and feed her first child but was unsuccessful.   Baby is latching easily but falls off the breast or stops sucking with minimal swallows. Gave and reviewed nipple shield. Baby was able to maintain latch with more swallows noted but still remains frantic. Discussed supplementation if she is unable to express volume or baby remains frantic. LC and Mother expressed and fed to baby. Baby tolerated spoon feed well. Baby was left skin to skin with Mother. Encouraged 8 or more good feeds after 24 HOL. Reviewed pump set up, cleaning of supplies and milk storage guidelines. [facility] # left on board, encouraged to call for any assistance. Mother has no further questions at this time.    Maternal Data Has patient been taught Hand Expression?: Yes Does the patient have breastfeeding experience prior to this delivery?: Yes How long did the patient breastfeed?: attempted for 2 weeks, latch difficulty  Feeding Mother's Current Feeding Choice: Breast Milk  LATCH Score Latch: Repeated attempts needed to sustain latch, nipple held in mouth throughout feeding, stimulation needed to elicit sucking reflex.  Audible Swallowing: A few with stimulation  Type of Nipple: Everted at rest and after stimulation (short)  Comfort (Breast/Nipple): Filling, red/small blisters or bruises, mild/mod discomfort  Hold (Positioning): Assistance needed to correctly position infant at breast and maintain latch.  LATCH Score: 6   Lactation Tools  Discussed/Used Tools: Other (comment);Nipple Shields (spoon fed colostrum) Nipple shield size: 20 Breast pump type: Double-Electric Breast Pump Pump Education: Setup, frequency, and cleaning;Milk Storage Reason for Pumping: breast augment, latch diff, nipple shield Pumping frequency: encouraged to pump 8x's/24hours  Interventions Interventions: Breast feeding basics reviewed;Assisted with latch;Breast massage;Hand express;Adjust position;Support pillows;Position options;Expressed milk;Comfort gels;DEBP;Education  Discharge Pump: DEBP (from insurance)  Consult Status Consult Status: Follow-up Follow-up type: Call as needed    Kavian Peters D Lyanne Kates 05/14/2023, 11:01 AM

## 2023-05-15 MED ORDER — DIPHENHYDRAMINE HCL 50 MG/ML IJ SOLN
50.0000 mg | Freq: Once | INTRAMUSCULAR | Status: AC
  Administered 2023-05-15: 50 mg via INTRAVENOUS
  Filled 2023-05-15: qty 1

## 2023-05-15 MED ORDER — SODIUM CHLORIDE 0.9 % IV SOLN
1000.0000 mg | Freq: Once | INTRAVENOUS | Status: AC
  Administered 2023-05-15: 1000 mg via INTRAVENOUS
  Filled 2023-05-15: qty 20

## 2023-05-15 MED ORDER — OXYCODONE HCL 5 MG PO TABS
5.0000 mg | ORAL_TABLET | ORAL | Status: DC | PRN
  Administered 2023-05-15 – 2023-05-16 (×3): 5 mg via ORAL
  Administered 2023-05-16 (×5): 10 mg via ORAL
  Filled 2023-05-15 (×2): qty 2
  Filled 2023-05-15: qty 1
  Filled 2023-05-15: qty 2
  Filled 2023-05-15: qty 1
  Filled 2023-05-15: qty 2
  Filled 2023-05-15: qty 1
  Filled 2023-05-15: qty 2

## 2023-05-15 MED ORDER — SODIUM CHLORIDE 0.9 % IV SOLN
25.0000 mg | Freq: Once | INTRAVENOUS | Status: AC
  Administered 2023-05-15: 25 mg via INTRAVENOUS
  Filled 2023-05-15: qty 0.5

## 2023-05-15 MED ORDER — WHITE PETROLATUM EX OINT: TOPICAL_OINTMENT | CUTANEOUS | Status: DC | PRN

## 2023-05-15 MED ORDER — DOCUSATE SODIUM 100 MG PO CAPS
100.0000 mg | ORAL_CAPSULE | Freq: Two times a day (BID) | ORAL | Status: DC
  Administered 2023-05-15 – 2023-05-16 (×4): 100 mg via ORAL
  Filled 2023-05-15 (×4): qty 1

## 2023-05-15 NOTE — Progress Notes (Signed)
Subjective:   Lydia Kennedy had a planned repeat c/s on 05/13/23. Has had routine postpartum care.  Pt. Is eating, hydrating, and voiding regularly without difficulty. Has had BM but felt is was hard and would like to add colace to her daily meds. Has worked with lactation and was able to get baby the breast with LC support but not independently yet. Has hand expressed a little and spoon fed some but is mainly bottle feeding formula currently. Knows importance of frequently milk removal for maintenance of milk supply and has pump and flanges available to her. Reports scant vaginal bleeding, denies passing large blood clots. Pain has been mainly incisional, improved with tylenol and ibuprofen. Was taking percocet but had requested switch to oxycodone PRN so that she would only need to take narcotics when in more intense pain but still be able to take tylenol independently. Also has pain on sides of abdomen where tape from pressure dressing was, in removal process she was left with some abrasions that are sensitive. Partner at bedside, holding baby.   Denies feeling SOB, dizzy, lightheaded with ambulation. Also denies palpitations.   Per parents, Pediatrician would like baby to stay another day. They are concerned about possible NAS symptoms and TTN and would like to keep monitoring. Anticipate discharge home tomorrow.   Lydia Kennedy had a positive UDS on 10/27 for alprazolam, oxycodone, amphetamines and nicotine. She reports having a Rx for alprazolam for anxiety from a psychiatrist. She told her PP RN she took Adderall for ADD. Also endorses Tob use. Repeat UDS was not done. Chart review does not show active Rx for alprazolam or Adderall although prescribers notes may not be available through care everywhere. Cord stat on baby was sent and is pending.  Objective:  Vital signs in last 24 hours: Temp:  [97.8 F (36.6 C)-98.5 F (36.9 C)] 98.5 F (36.9 C) (05/22 1227) Pulse Rate:  [83-99] 83  (05/22 1227) Resp:  [17-20] 17 (05/22 1227) BP: (104-124)/(56-81) 121/78 (05/22 1227) SpO2:  [95 %-99 %] 99 % (05/22 1227)    General: NAD Pulmonary: no increased work of breathing Abdomen: soft, non-tender, left sided superficial abrasion to abdomen Fundus: firm, midline, at umbilicus Lochia: scant rubra, no clots Incision: honeycomb dressing intact, slight bruising below incision on left side, no erythema/edema/exudate Extremities: slight edema to lower extremities, no erythema, no tenderness  Results for orders placed or performed during the hospital encounter of 05/13/23 (from the past 72 hour(s))  ABO/Rh     Status: None   Collection Time: 05/13/23  6:03 AM  Result Value Ref Range   ABO/RH(D)      A POS Performed at [facility], [address] [place], [place] [address]   CBC     Status: Abnormal   Collection Time: 05/14/23  6:30 AM  Result Value Ref Range   WBC 8.1 4.0 - 10.5 K/uL   RBC 2.75 (L) 3.87 - 5.11 MIL/uL   Hemoglobin 8.4 (L) 12.0 - 15.0 g/dL   HCT 91.4 (L) 78.2 - 95.6 %   MCV 89.1 80.0 - 100.0 fL   MCH 30.5 26.0 - 34.0 pg   MCHC 34.3 30.0 - 36.0 g/dL   RDW 21.3 08.6 - 57.8 %   Platelets 157 150 - 400 K/uL   nRBC 0.0 0.0 - 0.2 %    Comment: Performed at [facility], [address] [place], [place] [address]    Assessment:   32 y.o. [phone] 2 day(s)  s/p repeat c/s Primarily bottle feeding, some breastfeeding Anemia secondary to acute blood loss- hemodynamically stable and symptomatic VSS Pain well controlled constipation  Plan:    PO Fe in PNV, Fe infusion ordered to be administered today, patient agreeable to plan Blood Type --/--/A POS Performed at [facility], [address] [place], [place] [address]  [phone]) / [name] 5.26 (10/27 0859) / Varicella Immune Rhogam, rubella, varicella not indicated Tdap declined Feeding plan breast & bottle, lactation support Encouraged to continue breastfeeding, BF  education on latch, position changes, cluster feeding, hunger cues, lactogenesis II, milk supply Vasoline PRN ordered to apply to superficial abdominal abrasion Colace ordered in addition to [place]  Continued routine postpartum care  Counseled on normal uterine involution and vaginal bleeding postpartum Anticipate discharge home tomorrow    Raeford Razor, FNP, CNM [facility] 05/15/2023, 1:47 PM

## 2023-05-15 NOTE — Progress Notes (Signed)
Patient has been anxious on and off but appearing appropriate when it comes to the care of baby. Had an episode of anxiety after receiving benadryl IV and felt she was going to pass out. This was given almost 2 hours after xanax. Patient is very drowsy and sleepy now. Continuing to monitor

## 2023-05-16 MED ORDER — ACETAMINOPHEN 325 MG PO TABS: 650.0000 mg | ORAL_TABLET | ORAL | Status: DC | PRN

## 2023-05-16 MED ORDER — IBUPROFEN 600 MG PO TABS: 600.0000 mg | ORAL_TABLET | Freq: Four times a day (QID) | ORAL | 0 refills | Status: DC

## 2023-05-16 MED ORDER — OXYCODONE HCL 5 MG PO TABS: 5.0000 mg | ORAL_TABLET | ORAL | 0 refills | Status: DC | PRN

## 2023-05-16 NOTE — Discharge Instructions (Addendum)
Discharge instructions:   Call office if you have any of the following: headache, visual changes, fever >101 F, chills, breast concerns, excessive vaginal bleeding, leg pain or redness, depression or any other concerns.   Activity: Do not lift > 20 lbs for 6 weeks.  No intercourse or tampons for 6 weeks.  No driving for 1-2 weeks.   Call your doctor for increased pain or vaginal bleeding, temperature above 101.0, depression, or concerns.  No strenuous activity or heavy lifting for 6 weeks.  No intercourse, tampons, douching, or enemas for 6 weeks.  Tub baths and showers are ok.  No driving for 2 weeks or while taking pain medications.  Continue prenatal vitamin and iron.  Increase calories and fluids while breastfeeding.  For concerns about your baby please call the pediatrician For breastfeeding issues please remember to call our lactation consultants.  Remove honeycomb dressing on Sunday May 26th

## 2023-05-16 NOTE — Discharge Summary (Signed)
Obstetrical Discharge Summary  Date of Admission: 05/13/2023 Date of Discharge: 05/16/2023  Primary OB: [facility]   Gestational Age at Delivery: [redacted]w[redacted]d   Antepartum complications: CHTN, anxiety, ADHD, pos UDS  Reason for Admission: scheduled c-section  Date of Delivery: 5/202/2024 Delivered By: Dr Logan Bores  Delivery Type: repeat cesarean section, low transverse incision Intrapartum complications/course: None Anesthesia: spinal Placenta: Delivered and expressed via active management. Intact: yes. To pathology: no.  Laceration: n/a Episiotomy: LCT uterus EBL: Baby: Liveborn female, APGARs 8/9, weight 3150 g.    Discharge Diagnosis: Delivered.   Postpartum course: Doing well. Ambulating and voiding without difficulty. Has had a BM, which ws difficult to pass. Now using stool softeners. Pain well managed. Is using 2 tablets of oxy as needed, desires script to be sent to pharmacy. Mood is ok, admits to feeling anxious as the infant has a heart murmur and needs to to stay one more day for observation. Hopes to give breast milk, has only pumped once since birth. Has a lot of family around for PP support. Desires to go home later today.   Discharge Vital Signs:  Current Vital Signs 24h Vital Sign Ranges  T 97.9 F (36.6 C) Temp  Avg: 98.3 F (36.8 C)  Min: 97.9 F (36.6 C)  Max: 98.8 F (37.1 C)  BP 112/70 BP  Min: 110/70  Max: 133/83  HR 86 Pulse  Avg: 88.3  Min: 79  Max: 106  RR 20 Resp  Avg: 19.4  Min: 17  Max: 20  SaO2 98 % Room Air SpO2  Avg: 99.2 %  Min: 98 %  Max: 100 %       24 Hour I/O Current Shift I/O  Time Ins Outs 05/22 0701 - 05/23 0700 In: 109.4  Out: -  05/23 0701 - 05/23 1900 In: 120 [P.O.:120] Out: -      Discharge Exam:  NAD Perineum: NA Abdomen: firm fundus below the umbilicus, NTTP, non distended, +bowel sounds.  Incision c/d/i with honey comb dressing. Soft bandage on left side-dray and intact  RRR no MRGs CTAB Breasts: soft, no redness or  masses. Nipples intact bilaterally.  ZOX:WRUEA BLE, negative homan's   Recent Labs  Lab 05/10/23 0902 05/14/23 0630  WBC 6.9 8.1  HGB 12.5 8.4*  HCT 36.3 24.5*  PLT 213 157    Disposition: Home  Rh Immune globulin given: no Rubella vaccine given: no Tdap vaccine given in AP or PP setting: declined Flu vaccine given in AP or PP setting: declined  Contraception:  undecided, it pretty sure she does not desire another pregnancy. Reviewed progesterone only methods.   Prenatal/Postnatal Panel: A POS Performed at [facility], [address] [place], [place] [address] [name] Immune//Varicella Immune//RPR negative//HIV negative/HepB Surface Ag negative//pap no abnormalities (date: 03/2020)//plans to breastfeed, plans to bottle feed  Plan:  ONEKA VEGH was discharged to home in good condition. Follow-up appointment with DJE in 1 week for a postop visit and then in 6wks for a PP visit   Future Appointments  Date Time Provider Department Center  05/21/2023  9:35 AM Linzie Collin, MD [facility] None    Discharge Medications: Allergies as of 05/16/2023       Reactions   Cephalexin Anaphylaxis   Elemental Sulfur Shortness Of Breath   Other reaction(s): Muscle Pain   Penicillins Anaphylaxis, Shortness Of Breath   Throat swelling   Sulfa Antibiotics Anaphylaxis   Chlorhexidine Gluconate Rash  Medication List     TAKE these medications    acetaminophen 325 MG tablet Commonly known as: TYLENOL Take 2 tablets (650 mg total) by mouth every 4 (four) hours as needed for mild pain (temperature > 101.5.).   alprazolam 2 MG tablet Commonly known as: XANAX Take 2 mg by mouth 3 (three) times daily as needed for anxiety.   butalbital-acetaminophen-caffeine 50-325-40 MG tablet Commonly known as: FIORICET Take 1-2 tablets by mouth every 6 (six) hours as needed for headache.   ibuprofen 600 MG tablet Commonly known as: ADVIL Take 1 tablet (600 mg  total) by mouth every 6 (six) hours.   NIFEdipine 30 MG 24 hr tablet Commonly known as: PROCARDIA-XL/NIFEDICAL-XL Take 1 tablet (30 mg total) by mouth daily. Can increase to twice a day as needed for symptomatic contractions What changed: when to take this   oxyCODONE 5 MG immediate release tablet Commonly known as: Oxy IR/ROXICODONE Take 1-2 tablets (5-10 mg total) by mouth every 4 (four) hours as needed for moderate pain or breakthrough pain.   WesTab Plus 27-1 MG Tabs TAKE 1 TABLET BY MOUTH EVERY DAY       Carie Caddy, CNM  , [facility]  05/16/2023 12:11 PM

## 2023-05-16 NOTE — Final Progress Note (Signed)
Obstetric Postpartum Daily Progress Note Subjective:  32 y.o. [id number] postpartum day #3 status post RLTCS    Doing well. Ambulating and voiding without difficulty. Has had a BM, which ws difficult to pass. Now using stool softeners. Pain well managed. Is using 2 tablets of oxy as needed, desires script to be sent to pharmacy. Mood is ok, admits to feeling anxious as the infant has a heart murmur and needs to to stay one more day for observation. Hopes to give breast milk, has only pumped once since birth. Has a lot of family around for PP support. Desires to go home later today.    Medications SCHEDULED MEDICATIONS   docusate sodium  100 mg Oral BID   gabapentin  300 mg Oral QHS   ibuprofen  600 mg Oral Q6H   NIFEdipine  30 mg Oral Daily   prenatal multivitamin  1 tablet Oral Q1200   scopolamine  1 patch Transdermal Once   senna-docusate  2 tablet Oral Q24H   simethicone  80 mg Oral QID    MEDICATION INFUSIONS   lactated ringers Stopped (05/13/23 2328)   naloxone HCl (NARCAN) 2 mg in dextrose 5 % 250 mL infusion      PRN MEDICATIONS  acetaminophen, ALPRAZolam, witch hazel-glycerin **AND** dibucaine, diphenhydrAMINE, menthol-cetylpyridinium, methylergonovine **OR** methylergonovine, naloxone **AND** sodium chloride flush, naloxone HCl (NARCAN) 2 mg in dextrose 5 % 250 mL infusion, ondansetron (ZOFRAN) IV, oxyCODONE, white petrolatum, zolpidem    Objective:   Vitals:   05/15/23 2046 05/15/23 2336 05/16/23 0414 05/16/23 0857  BP: 132/82 110/70 114/76 112/70  Pulse: 88 88 79 86  Resp: 20 20 20    Temp: 98.4 F (36.9 C) 98 F (36.7 C) 98.1 F (36.7 C) 97.9 F (36.6 C)  TempSrc: Oral Oral Oral   SpO2: 100% 100% 98% 98%  Weight:      Height:        Current Vital Signs 24h Vital Sign Ranges  T 97.9 F (36.6 C) Temp  Avg: 98.3 F (36.8 C)  Min: 97.9 F (36.6 C)  Max: 98.8 F (37.1 C)  BP 112/70 BP  Min: 110/70  Max: 133/83  HR 86 Pulse  Avg: 88.3  Min: 79  Max: 106  RR 20 Resp   Avg: 19.4  Min: 17  Max: 20  SaO2 98 % Room Air SpO2  Avg: 99.2 %  Min: 98 %  Max: 100 %       24 Hour I/O Current Shift I/O  Time Ins Outs 05/22 0701 - 05/23 0700 In: 109.4  Out: -  05/23 0701 - 05/23 1900 In: 120 [P.O.:120] Out: -    NAD Perineum: NA Abdomen: firm fundus below the umbilicus, NTTP, non distended, +bowel sounds.  Incision c/d/i with honey comb dressing. Soft bandage on left side-dray and intact  RRR no MRGs CTAB Breasts: soft, no redness or masses. Nipples intact bilaterally.  YNW:GNFAO BLE, negative homan's       Labs:  Recent Labs  Lab 05/10/23 0902 05/14/23 0630  WBC 6.9 8.1  HGB 12.5 8.4*  HCT 36.3 24.5*  PLT 213 157     Assessment:   32 y.o. G2P2002 postpartum day # 3 status post RLTCS  Plan:   1) Acute blood loss anemia - hemodynamically stable and asymptomatic - po ferrous sulfate  2) A POS Performed at [facility], [address] [place], [place] [address]  / [name] 5.26 (10/27 0859)/ Varicella Immune  3) TDAP status declined  4) breast and bottle /Contraception =  undecided   5) Disposition discharge later today   Ellouise Newer [facility], CNM  05/16/2023 12:12 PM

## 2023-05-17 ENCOUNTER — Ambulatory Visit: Payer: Self-pay

## 2023-05-17 NOTE — Lactation Note (Signed)
This note was copied from a baby's chart. Lactation Consultation Note  Patient Name: Lydia Kennedy Date: 05/17/2023 Age:32 days    Lactation Note: Mother called lactation phone to request assistance prior to discharge to home. Mother experiencing bilateral engorgement. Mom had initially offered baby to breastfeed but has been formula feeding baby every 2-3 hours. Mom has not pumped. LC entered the room . Mom had just completed pumping to relieve her breast engorgement. Mom has 2 bottles of breastmilk. Mom unsure of feeding plan going forward. She had questions about drying up and  managing breast engorgement. Provided tips and strategies. She also had questions if she decided to pump and provide milk to baby. Mom reports she does take xanax 2 mg three times per day. Reviewed with mother recommendation from Medications and Mother's Milk([place], 2023) that adult dose of xanax in order to be considered probably compatible(L3) would need to be short term or intermittent use at a low dose of 0.5 mg-1.0 mg not more than 3 times per day. Reviewed concerns for infant monitoring of sedation, slowed breathing rate, not waking to feed/poor feeding. As parents and baby are ready for discharge to home recommended mom continue with current formula feeding plan and to discuss further with baby's pediatrician at first visit if she decides she would like to give the baby some expressed breastmilk.   Lydia Beers RN,BSN,IBCLC    Fuller Song 05/17/2023, 6:13 PM

## 2023-05-17 NOTE — Progress Notes (Signed)
All discharge instructions reviewed with pt; pt knows where to get prescriptions; pt already has 1 week follow up appointment scheduled; pt discharged at 2359 on 05-16-23; pt staying here due to baby still being a patient at hospital; pt's significant other still at bedside assisting with infant care

## 2023-05-21 ENCOUNTER — Ambulatory Visit (INDEPENDENT_AMBULATORY_CARE_PROVIDER_SITE_OTHER): Payer: Medicaid Other | Admitting: Obstetrics

## 2023-05-21 ENCOUNTER — Encounter: Payer: Medicaid Other | Admitting: Obstetrics and Gynecology

## 2023-05-21 ENCOUNTER — Encounter: Payer: Self-pay | Admitting: Obstetrics

## 2023-05-21 VITALS — BP 114/80 | HR 103 | Wt 174.2 lb

## 2023-05-21 DIAGNOSIS — Z9889 Other specified postprocedural states: Secondary | ICD-10-CM

## 2023-05-21 DIAGNOSIS — G8918 Other acute postprocedural pain: Secondary | ICD-10-CM

## 2023-05-21 MED ORDER — TRAMADOL HCL 50 MG PO TABS
50.0000 mg | ORAL_TABLET | Freq: Four times a day (QID) | ORAL | 0 refills | Status: DC | PRN
Start: 2023-05-21 — End: 2023-05-22

## 2023-05-22 ENCOUNTER — Other Ambulatory Visit: Payer: Self-pay | Admitting: Obstetrics

## 2023-05-22 ENCOUNTER — Encounter: Payer: Self-pay | Admitting: Obstetrics

## 2023-05-22 ENCOUNTER — Other Ambulatory Visit: Payer: Self-pay | Admitting: Certified Nurse Midwife

## 2023-05-22 DIAGNOSIS — G8918 Other acute postprocedural pain: Secondary | ICD-10-CM

## 2023-05-22 MED ORDER — OXYCODONE HCL 5 MG PO TABS: 5.0000 mg | ORAL_TABLET | ORAL | 0 refills | Status: DC | PRN

## 2023-05-22 NOTE — Progress Notes (Unsigned)
Patient called in tot he office. Still struggling with postop pain. The Tramadol is making her very dizzy. We will renew her Rx for roxicodone [employer] is not working for this CNM at the office, so will contact the CNM on call to assist in getting her medication renewed.I have also recommended the pateint make another appointment to see an MD at the office. Mirna Mires, CNM  05/22/2023 5:31 PM

## 2023-05-22 NOTE — Telephone Encounter (Signed)
FOB calling; pt had called him; he is on his way home from work; the tramadol is sort of helping the pain but it makes pt nauseas, H/A, and off balance which makes it hard to take care of a newborn.  Adv him her msg from earlier has been send to the provider and we will get back with her. He states she is in a lot of pain.

## 2023-06-06 ENCOUNTER — Telehealth: Payer: Self-pay

## 2023-06-06 NOTE — Telephone Encounter (Signed)
[facility]- Discharge Call Backs- Spoke to patient on the phone about the following below. 1-Do you have any questions or concerns about yourself as you heal?Yes-had a question about fluid on her knees an up to hips and pain with that since delivery. Encouraged pt to call OBGYN to speak with a nurse. 2-Any concerns or questions about your baby?No Is your baby eating, peeing,pooping well?Yes 3-Reviewed ABC's of safe sleep. 4-How was your stay at the hospital?Good 5- Did our team work together to care for you?Yes You should be receiving a survey in the mail soon.   We would really appreciate it if you could fill that out for [place] and return it in the mail.  We value the feedback to make improvements and continue the great work we do.   If you have any questions please feel free to call me back at [phone]

## 2023-06-06 NOTE — Telephone Encounter (Signed)
We don't have a sooner appt.  There is very little wiggle room in the schedule.

## 2023-06-06 NOTE — Telephone Encounter (Signed)
Pt calling; is 3w pp; still has pain and swelling in her knees that is worse than it was while she was preg; is unable to sleep on sides.  [phone]  Pt states her knees hurts to straighten them out and when she does there is a raised area that comes up on both knees.  Has tried tylenol/IBU alternatively, heating pad and ice pack.  Pt does have a virtual appt with [facility] later this pm but thinks it might be better if she saw someone face to face instead so they could get a better look at her.  Pt is going to see if UC can see her today and if so will cancel appt at [facility].  Pt's BP has been normal per pt.

## 2023-06-16 ENCOUNTER — Encounter: Payer: Self-pay | Admitting: Obstetrics and Gynecology

## 2023-06-17 NOTE — Telephone Encounter (Signed)
Patient called requesting a note to return back to work.  I explained to her that she will need to come in for her 6 week PP on June 19, 2023 with Dr. Logan Bores.  She is insisting to speak to Dr. Logan Bores' nurse.  She also wants a call today.  She is also kept asking to Nancy(who no longer works here).  Please give patient a call back.

## 2023-06-17 NOTE — Telephone Encounter (Signed)
Spoke with patient regarding medical clearance to return to work prior to her 6 week ppv. Discussed with Paula Compton, CNM, patient should wait until appointment on Wednesday due to ongoing pain issues in her postpartum period. Called patient with recommendation and she states she understands and will be at her upcoming appointment.

## 2023-06-18 ENCOUNTER — Encounter: Payer: Self-pay | Admitting: Obstetrics and Gynecology

## 2023-06-19 ENCOUNTER — Encounter: Payer: Self-pay | Admitting: Obstetrics and Gynecology

## 2023-06-19 ENCOUNTER — Ambulatory Visit (INDEPENDENT_AMBULATORY_CARE_PROVIDER_SITE_OTHER): Payer: Managed Care, Other (non HMO) | Admitting: Obstetrics and Gynecology

## 2023-06-19 VITALS — BP 100/68 | HR 94 | Ht 65.0 in | Wt 182.3 lb

## 2023-06-19 DIAGNOSIS — Z9889 Other specified postprocedural states: Secondary | ICD-10-CM

## 2023-06-19 DIAGNOSIS — G44219 Episodic tension-type headache, not intractable: Secondary | ICD-10-CM

## 2023-06-19 MED ORDER — BUTALBITAL-APAP-CAFFEINE 50-325-40 MG PO CAPS
1.0000 | ORAL_CAPSULE | Freq: Four times a day (QID) | ORAL | 1 refills | Status: DC | PRN
Start: 2023-06-19 — End: 2023-12-05

## 2023-06-19 NOTE — Progress Notes (Signed)
Patient presents today for 6 week postpartum follow-up. Patient had a cesarean delivery on 05/13/23. Patient is bottle feeding. She states she remains unsure of what she would like for birth control. Continuing to take 30 mg Procardia daily and monitoring BP at home. EPDS score of 4. She states no other questions or concerns at this time.

## 2023-06-19 NOTE — Progress Notes (Signed)
HPI:      [name] is a 32 y.o. [phone] who LMP was No LMP recorded.  Subjective:   She presents today 6 weeks postop from cystoscopy delivery.  She is generally doing well.  Reports no issues with her incision.  Reports no problems with pelvic pain.  She does state that she occasionally has hip pain, has had knee pain and received prednisone from [facility] for this but she has discontinued it, has intermittent migraines and complains of nighttime cramps in her legs and hips.  She is requesting medications for her migraine short-term until she can find a PCP.  She has been taking her Procardia and reports that her blood pressures have all been normal at home. She is requesting a back to work note    Hx: The following portions of the patient's history were reviewed and updated as appropriate:             She  has a past medical history of Acute gastritis without hemorrhage, ADHD, Anemia, Anxiety, Chronic kidney disease, Essential hypertension, Family history of breast cancer, GERD (gastroesophageal reflux disease), Headache, IC (interstitial cystitis), Infection, Labor and delivery, indication for care (03/29/2023), Thyroid dysfunction, Thyroid nodule, and Tonsillitis. She does not have any pertinent problems on file. She  has a past surgical history that includes Cesarean section (2012); Breast enhancement surgery (Bilateral, 2016); Tonsillectomy (N/A, 01/29/2018); Esophagogastroduodenoscopy (egd) with propofol (N/A, 07/07/2018); and Cesarean section (N/A, 05/13/2023). Her family history includes Breast cancer (age of onset: 86) in her mother; Hypertension in her father; Pancreatic cancer (age of onset: 37) in her maternal grandmother. She  reports that she quit smoking about 5 weeks ago. Her smoking use included cigarettes. She smoked an average of .25 packs per day. She has never used smokeless tobacco. She reports that she does not currently use alcohol after a past usage of about 3.0  standard drinks of alcohol per week. She reports that she does not use drugs. She has a current medication list which includes the following prescription(s): acetaminophen, alprazolam, ibuprofen, and nifedipine. She is allergic to cephalexin, elemental sulfur, penicillins, sulfa antibiotics, and chlorhexidine gluconate.       Review of Systems:  Review of Systems  Constitutional: Denied constitutional symptoms, night sweats, recent illness, fatigue, fever, insomnia and weight loss.  Eyes: Denied eye symptoms, eye pain, photophobia, vision change and visual disturbance.  Ears/Nose/Throat/Neck: Denied ear, nose, throat or neck symptoms, hearing loss, nasal discharge, sinus congestion and sore throat.  Cardiovascular: Denied cardiovascular symptoms, arrhythmia, chest pain/pressure, edema, exercise intolerance, orthopnea and palpitations.  Respiratory: Denied pulmonary symptoms, asthma, pleuritic pain, productive sputum, cough, dyspnea and wheezing.  Gastrointestinal: Denied, gastro-esophageal reflux, melena, nausea and vomiting.  Genitourinary: Denied genitourinary symptoms including symptomatic vaginal discharge, pelvic relaxation issues, and urinary complaints.  Musculoskeletal: See HPI for additional information.  Dermatologic: Denied dermatology symptoms, rash and scar.  Neurologic: Denied neurology symptoms, dizziness, headache, neck pain and syncope.  Psychiatric: Denied psychiatric symptoms, anxiety and depression.  Endocrine: Denied endocrine symptoms including hot flashes and night sweats.   Meds:   Current Outpatient Medications on File Prior to Visit  Medication Sig Dispense Refill   acetaminophen (TYLENOL) 325 MG tablet Take 2 tablets (650 mg total) by mouth every 4 (four) hours as needed for mild pain (temperature > 101.5.).     alprazolam (XANAX) 2 MG tablet Take 2 mg by mouth 3 (three) times daily as needed for anxiety.     ibuprofen (ADVIL) 600 MG  tablet Take 1 tablet (600 mg  total) by mouth every 6 (six) hours. 30 tablet 0   NIFEdipine (PROCARDIA-XL/NIFEDICAL-XL) 30 MG 24 hr tablet Take 1 tablet (30 mg total) by mouth daily. Can increase to twice a day as needed for symptomatic contractions (Patient taking differently: Take 30 mg by mouth at bedtime. Can increase to twice a day as needed for symptomatic contractions) 30 tablet 2   No current facility-administered medications on file prior to visit.      Objective:     Vitals:   06/19/23 1115  BP: 100/68  Pulse: 94   Filed Weights   06/19/23 1115  Weight: 182 lb 4.8 oz (82.7 kg)               Abdomen: Soft.  Non-tender.  No masses.  No HSM.  Incision/s: Intact.  Healing well.  No erythema.  No drainage.             Assessment:    G2P2002 Patient Active Problem List   Diagnosis Date Noted   Pregnancy 05/13/2023   Maternal chronic hypertension, third trimester 05/01/2023   Labor and delivery, indication for care 03/29/2023   Pre-existing hypertension during pregnancy, antepartum 03/06/2023   Excessive weight gain during pregnancy in third trimester 03/06/2023   History of kidney problems 11/23/2022   History of cesarean delivery affecting pregnancy 11/04/2022   Supervision of high-risk pregnancy 10/12/2022   Essential hypertension 10/24/2021   ADHD 10/24/2021   Thyroid nodule 09/08/2021   Other somatoform disorders    Gastroesophageal reflux disease without esophagitis    Acute gastritis without hemorrhage    Generalized anxiety disorder 05/04/2016     1. Postoperative state   2. Postpartum care following cesarean delivery   3. Episodic tension-type headache, not intractable        Plan:            1.  May resume normal activities  2.  Short course of Fioricet for headaches  3.  Discussed use of potassium for leg cramps  4.  Discontinue Procardia over the next 10 days by decreasing dose to 15 daily and then stopping.  Patient will then begin to take blood pressures at home.   She will contact us if they become elevated. Orders No orders of the defined types were placed in this encounter.   No orders of the defined types were placed in this encounter.     F/U  Return in about 3 months (around 09/19/2023) for Annual Physical.  Elonda Husky, M.D. 06/19/2023 11:30 AM

## 2023-07-16 NOTE — Telephone Encounter (Signed)
Pt saw Dr. Logan Bores on 06/19/23 at 10:55.

## 2023-08-09 ENCOUNTER — Ambulatory Visit: Payer: Managed Care, Other (non HMO)

## 2023-08-14 NOTE — Progress Notes (Signed)
05/22/2023     Obstetrics & Gynecology Office Visit   Chief Complaint:  Chief Complaint  Patient presents with   Wound Check    History of Present Illness: Lydia Kennedy is a 32 year old post -operative patient , who had  a repeat Cesarean section  on 05/13/2023. She presents today with a complaint of ongoing postop pain. She has no more Percocet medication, and reports that the incisional pain is high. She denies any dysuria.  She denies any heavy bleeding; denies incisional leakage. Reports incisional and abdominal pain is 7 out of 10   Review of Systems:  Review of Systems  Constitutional: Negative.   HENT: Negative.    Eyes: Negative.   Respiratory: Negative.    Cardiovascular: Negative.   Gastrointestinal: Negative.   Genitourinary: Negative.   Musculoskeletal: Negative.   Skin:        Incisional pain from CS  Neurological: Negative.   Endo/Heme/Allergies: Negative.   Psychiatric/Behavioral:  The patient is nervous/anxious.      Past Medical History:  Past Medical History:  Diagnosis Date   Acute gastritis without hemorrhage    ADHD    Anemia    Anxiety    Chronic kidney disease    kidney reflux, problem more as child/ recuurent interstitial cystitis   Essential hypertension    Family history of breast cancer    7/21 cancer genetic testing letter sent   GERD (gastroesophageal reflux disease)    Headache    daily   IC (interstitial cystitis)    Infection    UTI   Labor and delivery, indication for care 03/29/2023   Thyroid dysfunction    Thyroid nodule    Tonsillitis     Past Surgical History:  Past Surgical History:  Procedure Laterality Date   BREAST ENHANCEMENT SURGERY Bilateral 2016   CESAREAN SECTION  2012   CESAREAN SECTION N/A 05/13/2023   Procedure: REPEAT CESAREAN SECTION;  Surgeon: Linzie Collin, MD;  Location: [facility];  Service: Obstetrics;  Laterality: N/A;   ESOPHAGOGASTRODUODENOSCOPY (EGD) WITH PROPOFOL N/A 07/07/2018   Procedure:  ESOPHAGOGASTRODUODENOSCOPY (EGD) WITH PROPOFOL;  Surgeon: Midge Minium, MD;  Location: [facility];  Service: Endoscopy;  Laterality: N/A;   TONSILLECTOMY N/A 01/29/2018   Procedure: TONSILLECTOMY;  Surgeon: Bud Face, MD;  Location: [facility];  Service: ENT;  Laterality: N/A;  UPREG    Gynecologic History: No LMP recorded.  Obstetric History: [id number]  Family History:  Family History  Problem Relation Age of Onset   Breast cancer Mother 59   Hypertension Father    Pancreatic cancer Maternal Grandmother 45    Social History:  Social History   Socioeconomic History   Marital status: Married    Spouse name: Markie   Number of children: 1   Years of education: Not on file   Highest education level: Associate degree: academic program  Occupational History   Occupation: Investment banker, corporate    Comment: [employer]  Tobacco Use   Smoking status: Former    Current packs/day: 0.00    Types: Cigarettes    Quit date: 05/13/2023    Years since quitting: 0.2   Smokeless tobacco: Never  Vaping Use   Vaping status: Some Days   Last attempt to quit: 01/12/2019  Substance and Sexual Activity   Alcohol use: Not Currently    Alcohol/week: 3.0 standard drinks of alcohol    Types: 3 Standard drinks or equivalent per week    Comment: occ   Drug  use: No   Sexual activity: Not Currently    Birth control/protection: None  Other Topics Concern   Not on file  Social History Narrative   Not on file   Social Determinants of Health   Financial Resource Strain: Medium Risk (10/12/2022)   Overall Financial Resource Strain (CARDIA)    Difficulty of Paying Living Expenses: Somewhat hard  Food Insecurity: No Food Insecurity (05/13/2023)   Hunger Vital Sign    Worried About Running Out of Food in the Last Year: Never true    Ran Out of Food in the Last Year: Never true  Transportation Needs: No Transportation Needs (05/13/2023)   PRAPARE - Doctor, general practice (Medical): No    Lack of Transportation (Non-Medical): No  Physical Activity: Insufficiently Active (10/12/2022)   Exercise Vital Sign    Days of Exercise per Week: 2 days    Minutes of Exercise per Session: 30 min  Stress: Stress Concern Present (10/12/2022)   [employer] of Occupational Health - Occupational Stress Questionnaire    Feeling of Stress : To some extent  Social Connections: Moderately Integrated (10/12/2022)   Social Connection and Isolation Panel [NHANES]    Frequency of Communication with Friends and Family: More than three times a week    Frequency of Social Gatherings with Friends and Family: More than three times a week    Attends Religious Services: 1 to 4 times per year    Active Member of [employer] or Organizations: No    Attends Banker Meetings: Never    Marital Status: Living with partner  Intimate Partner Violence: Not At Risk (05/13/2023)   Humiliation, Afraid, Rape, and Kick questionnaire    Fear of Current or Ex-Partner: No    Emotionally Abused: No    Physically Abused: No    Sexually Abused: No    Allergies:  Allergies  Allergen Reactions   Cephalexin Anaphylaxis   Elemental Sulfur Shortness Of Breath    Other reaction(s): Muscle Pain   Penicillins Anaphylaxis and Shortness Of Breath    Throat swelling   Sulfa Antibiotics Anaphylaxis   Chlorhexidine Gluconate Rash    Medications: Prior to Admission medications   Medication Sig Start Date End Date Taking? Authorizing Provider  acetaminophen (TYLENOL) 325 MG tablet Take 2 tablets (650 mg total) by mouth every 4 (four) hours as needed for mild pain (temperature > 101.5.). 05/16/23   Dominic, Courtney Heys, CNM  alprazolam Prudy Feeler) 2 MG tablet Take 2 mg by mouth 3 (three) times daily as needed for anxiety.    [provider]  Butalbital-APAP-Caffeine [phone] MG capsule Take 1-2 capsules by mouth every 6 (six) hours as needed for headache. 06/19/23   Linzie Collin, MD  ibuprofen (ADVIL) 600 MG tablet Take 1 tablet (600 mg total) by mouth every 6 (six) hours. 05/16/23   Dominic, Courtney Heys, CNM  NIFEdipine (PROCARDIA-XL/NIFEDICAL-XL) 30 MG 24 hr tablet Take 1 tablet (30 mg total) by mouth daily. Can increase to twice a day as needed for symptomatic contractions Patient taking differently: Take 30 mg by mouth at bedtime. Can increase to twice a day as needed for symptomatic contractions 01/22/23   Dominic, Courtney Heys, CNM    Physical Exam Vitals:  Vitals:   05/21/23 1352  BP: 114/80  Pulse: (!) 103   No LMP recorded.  Physical Exam Vitals reviewed.  Constitutional:      Appearance: Normal appearance.  HENT:     Head: Normocephalic  and atraumatic.  Cardiovascular:     Rate and Rhythm: Normal rate and regular rhythm.     Pulses: Normal pulses.     Heart sounds: Normal heart sounds.  Pulmonary:     Effort: Pulmonary effort is normal.     Breath sounds: Normal breath sounds.  Abdominal:     Comments: Operative incision is closed with minimal drainage. Healing well at one week post surgery. Fundus is firm. Some abdominal pina with fundal palpation.   Genitourinary:    Comments: Deferred. Musculoskeletal:        General: Normal range of motion.  Skin:    General: Skin is warm and dry.  Neurological:     General: No focal deficit present.     Mental Status: She is alert and oriented to person, place, and time.      Assessment: 33 y.o. [id number] now 8 days post Repeat Cesarean section. Pain control issues   Plan: Problem List Items Addressed This Visit   None Visit Diagnoses     Post-operative pain    -  Primary   Postpartum care following cesarean delivery          I have prescribed her additional narcotic pain medication. Advised her to stay well hydrated and use stool softeners , as the opioid is constipating. She has an appointment with Dr. Logan Bores for follow up  scheduled.  Mirna Mires, CNM  08/14/2023 12:04  PM  .

## 2023-10-04 ENCOUNTER — Ambulatory Visit: Payer: Managed Care, Other (non HMO)

## 2023-10-11 ENCOUNTER — Ambulatory Visit (INDEPENDENT_AMBULATORY_CARE_PROVIDER_SITE_OTHER): Payer: Managed Care, Other (non HMO)

## 2023-10-11 VITALS — BP 107/73 | HR 106 | Ht 65.0 in | Wt 167.0 lb

## 2023-10-11 DIAGNOSIS — Z3200 Encounter for pregnancy test, result unknown: Secondary | ICD-10-CM

## 2023-10-11 DIAGNOSIS — Z3201 Encounter for pregnancy test, result positive: Secondary | ICD-10-CM | POA: Diagnosis not present

## 2023-10-11 LAB — POCT URINE PREGNANCY: Preg Test, Ur: POSITIVE — AB

## 2023-10-11 NOTE — Progress Notes (Signed)
NURSE VISIT NOTE  Subjective:    Patient ID: SHONITA ACKROYD, female    DOB: April 15, 1991, 32 y.o.   MRN: [id number]  HPI  Patient is a 32 y.o. G64P2002 female who presents for evaluation of amenorrhea. She believes she could be pregnant.  This pregnancy was a surprise  Current symptoms also include: fatigue, nausea, and positive home pregnancy test. Last period was normal.Is unsure when her last period was, maybe July?    Objective:    BP 107/73   Pulse (!) 106   Ht 5\' 5"  (1.651 m)   Wt 167 lb (75.8 kg)   LMP  (LMP Unknown)   Breastfeeding No   BMI 27.79 kg/m   Lab Review  No results found for any visits on 10/11/23.  Assessment:   1. Possible pregnancy, not confirmed     Plan:   Pregnancy Test: Positive  Encouraged well-balanced diet, plenty of rest when needed, pre-natal vitamins daily and walking for exercise.  Discussed self-help for nausea, avoiding OTC medications until consulting provider or pharmacist, other than Tylenol as needed, minimal caffeine (1-2 cups daily) and avoiding alcohol.   She will schedule her nurse visit @ 7-[redacted] wks pregnant, u/s for dating @10  wk, and NOB visit at [redacted] wk pregnant.    Feel free to call with any questions. Consulted with Isabelle Course.Dominic CMN stated ok to do [place] as unsure when Lmp and she is stating it was sometime in July.     Loney Laurence, CMA

## 2023-10-16 ENCOUNTER — Other Ambulatory Visit: Payer: Self-pay | Admitting: Licensed Practical Nurse

## 2023-10-16 ENCOUNTER — Ambulatory Visit
Admission: RE | Admit: 2023-10-16 | Discharge: 2023-10-16 | Disposition: A | Discharge status: Home / Self Care | Payer: Managed Care, Other (non HMO) | Source: Ambulatory Visit | Attending: Licensed Practical Nurse | Admitting: Licensed Practical Nurse

## 2023-10-16 DIAGNOSIS — Z3200 Encounter for pregnancy test, result unknown: Secondary | ICD-10-CM | POA: Insufficient documentation

## 2023-10-17 ENCOUNTER — Telehealth: Payer: Self-pay | Admitting: Obstetrics and Gynecology

## 2023-10-17 ENCOUNTER — Telehealth: Payer: Managed Care, Other (non HMO)

## 2023-10-17 NOTE — Telephone Encounter (Signed)
Reached out to pt to reschedule NOB Nurse Intake visit that was scheduled on 10/17/2023 at 11:15.  Could not leave a message bc mailbox was full.

## 2023-10-18 ENCOUNTER — Encounter: Payer: Self-pay | Admitting: Obstetrics and Gynecology

## 2023-10-18 NOTE — Telephone Encounter (Signed)
Contacted the patient via phone, left voicemail asking the patient to contact the office for rescheduled. My chart letter sent

## 2023-12-05 ENCOUNTER — Telehealth: Payer: Managed Care, Other (non HMO)

## 2023-12-05 VITALS — Wt 167.0 lb

## 2023-12-05 DIAGNOSIS — Z3689 Encounter for other specified antenatal screening: Secondary | ICD-10-CM

## 2023-12-05 DIAGNOSIS — Z3480 Encounter for supervision of other normal pregnancy, unspecified trimester: Secondary | ICD-10-CM | POA: Insufficient documentation

## 2023-12-05 DIAGNOSIS — Z369 Encounter for antenatal screening, unspecified: Secondary | ICD-10-CM

## 2023-12-05 NOTE — Progress Notes (Signed)
New OB Intake  I connected with  Lydia Kennedy on 12/05/23 at  3:15 PM EST by Video Visit and verified that I am speaking with the correct person using two identifiers. Nurse is located at [facility] and pt is located at work.  I explained I am completing New OB Intake today. We discussed her EDD of 04/20/2024 that is based on u/s of [redacted]w[redacted]d. Pt is G3/P2002. I reviewed her allergies, medications, Medical/Surgical/OB history, and appropriate screenings. There are no cats in the home.  Based on history, this is a/an pregnancy uncomplicated . Her obstetrical history is significant for  essential hypertension .  Patient Active Problem List   Diagnosis Date Noted   Supervision of other normal pregnancy, antepartum 12/05/2023   Pre-existing hypertension during pregnancy, antepartum 03/06/2023   History of kidney problems 11/23/2022   Essential hypertension 10/24/2021   ADHD 10/24/2021   Thyroid nodule 09/08/2021   Other somatoform disorders    Gastroesophageal reflux disease without esophagitis    Acute gastritis without hemorrhage    Generalized anxiety disorder 05/04/2016    Concerns addressed today Pt stated after having sex yesterday she had a little spotting and sees brown when she wipes; adv most likely from intercourse; appt made per protocol for 2:55 tomorrow with MMS.  Delivery Plans:  Plans to deliver at [facility].  Anatomy US Explained first scheduled [place] was Oct 23rd and an anatomy scan will be ordered.  Labs Discussed Lydia Kennedy genetic screening with patient. Patient desires genetic testing to be drawn at new OB visit. Discussed possible labs to be drawn at new OB appointment.  COVID Vaccine Patient has not had COVID vaccine.   Social Determinants of Health Food Insecurity: denies food insecurity  Transportation: Patient denies transportation needs. Childcare: Discussed no children allowed at ultrasound appointments.   First visit review I  reviewed new OB appt with pt. I explained she will have ob bloodwork and pap smear/pelvic exam if indicated. Explained pt will be seen by Lydia Kennedy,  CNM at first visit; encounter routed to appropriate provider.   Lydia Kennedy, [facility] 12/05/2023  4:07 PM

## 2023-12-05 NOTE — Patient Instructions (Addendum)
Second Trimester of Pregnancy  The second trimester of pregnancy is from week 13 through week 27. This is months 4 through 6 of pregnancy. The second trimester is often a time when you feel your best. Your body has adjusted to being pregnant, and you begin to feel better physically. During the second trimester: Morning sickness has lessened or stopped completely. You may have more energy. You may have an increase in appetite. The second trimester is also a time when the unborn baby (fetus) is growing rapidly. At the end of the sixth month, the fetus may be up to 12 inches long and weigh about 1 pounds. You will likely begin to feel the baby move (quickening) between 16 and 20 weeks of pregnancy. Body changes during your second trimester Your body continues to go through many changes during your second trimester. The changes vary and generally return to normal after the baby is born. Physical changes Your weight will continue to increase. You will notice your lower abdomen bulging out. You may begin to get stretch marks on your hips, abdomen, and breasts. Your breasts will continue to grow and to become tender. Dark spots or blotches (chloasma or mask of pregnancy) may develop on your face. A dark line from your belly button to the pubic area (linea nigra) may appear. You may have changes in your hair. These can include thickening of your hair, rapid growth, and changes in texture. Some people also have hair loss during or after pregnancy, or hair that feels dry or thin. Health changes You may develop headaches. You may have heartburn. You may develop constipation. You may develop hemorrhoids or swollen, bulging veins (varicose veins). Your gums may bleed and may be sensitive to brushing and flossing. You may urinate more often because the fetus is pressing on your bladder. You may have back pain. This is caused by: Weight gain. Pregnancy hormones that are relaxing the joints in your  pelvis. A shift in weight and the muscles that support your balance. Follow these instructions at home: Medicines Follow your health care provider's instructions regarding medicine use. Specific medicines may be either safe or unsafe to take during pregnancy. Do not take any medicines unless approved by your health care provider. Take a prenatal vitamin that contains at least 600 micrograms (mcg) of folic acid. Eating and drinking Eat a healthy diet that includes fresh fruits and vegetables, whole grains, good sources of protein such as meat, eggs, or tofu, and low-fat dairy products. Avoid raw meat and unpasteurized juice, milk, and cheese. These carry germs that can harm you and your baby. You may need to take these actions to prevent or treat constipation: Drink enough fluid to keep your urine pale yellow. Eat foods that are high in fiber, such as beans, whole grains, and fresh fruits and vegetables. Limit foods that are high in fat and processed sugars, such as fried or sweet foods. Activity Exercise only as directed by your health care provider. Most people can continue their usual exercise routine during pregnancy. Try to exercise for 30 minutes at least 5 days a week. Stop exercising if you develop contractions in your uterus. Stop exercising if you develop pain or cramping in the lower abdomen or lower back. Avoid exercising if it is very hot or humid or if you are at a high altitude. Avoid heavy lifting. If you choose to, you may have sex unless your health care provider tells you not to. Relieving pain and discomfort Wear a supportive   bra to prevent discomfort from breast tenderness. Take warm sitz baths to soothe any pain or discomfort caused by hemorrhoids. Use hemorrhoid cream if your health care provider approves. Rest with your legs raised (elevated) if you have leg cramps or low back pain. If you develop varicose veins: Wear support hose as told by your health care  provider. Elevate your feet for 15 minutes, 3-4 times a day. Limit salt in your diet. Safety Wear your seat belt at all times when driving or riding in a car. Talk with your health care provider if someone is verbally or physically abusive to you. Lifestyle Do not use hot tubs, steam rooms, or saunas. Do not douche. Do not use tampons or scented sanitary pads. Avoid cat litter boxes and soil used by cats. These carry germs that can cause birth defects in the baby and possibly loss of the fetus by miscarriage or stillbirth. Do not use herbal remedies, alcohol, illegal drugs, or medicines that are not approved by your health care provider. Chemicals in these products can harm your baby. Do not use any products that contain nicotine or tobacco, such as cigarettes, e-cigarettes, and chewing tobacco. If you need help quitting, ask your health care provider. General instructions During a routine prenatal visit, your health care provider will do a physical exam and other tests. He or she will also discuss your overall health. Keep all follow-up visits. This is important. Ask your health care provider for a referral to a local prenatal education class. Ask for help if you have counseling or nutritional needs during pregnancy. Your health care provider can offer advice or refer you to specialists for help with various needs. Where to find more information American Pregnancy Association: americanpregnancy.org American College of Obstetricians and Gynecologists: acog.org/en/Womens%20Health/Pregnancy Office on Women's Health: womenshealth.gov/pregnancy Contact a health care provider if you have: A headache that does not go away when you take medicine. Vision changes or you see spots in front of your eyes. Mild pelvic cramps, pelvic pressure, or nagging pain in the abdominal area. Persistent nausea, vomiting, or diarrhea. A bad-smelling vaginal discharge or foul-smelling urine. Pain when you  urinate. Sudden or extreme swelling of your face, hands, ankles, feet, or legs. A fever. Get help right away if you: Have fluid leaking from your vagina. Have spotting or bleeding from your vagina. Have severe abdominal cramping or pain. Have difficulty breathing. Have chest pain. Have fainting spells. Have not felt your baby move for the time period told by your health care provider. Have new or increased pain, swelling, or redness in an arm or leg. Summary The second trimester of pregnancy is from week 13 through week 27 (months 4 through 6). Do not use herbal remedies, alcohol, illegal drugs, or medicines that are not approved by your health care provider. Chemicals in these products can harm your baby. Exercise only as directed by your health care provider. Most people can continue their usual exercise routine during pregnancy. Keep all follow-up visits. This is important. This information is not intended to replace advice given to you by your health care provider. Make sure you discuss any questions you have with your health care provider. Document Revised: 05/18/2020 Document Reviewed: 03/24/2020 Elsevier Patient Education  2024 [employer] First Trimester of Pregnancy  The first trimester of pregnancy starts on the first day of your last menstrual period until the end of week 12. This is also called months 1 through 3 of pregnancy. Body changes during your first trimester Your   body goes through many changes during pregnancy. The changes usually return to normal after your baby is born. Physical changes You may gain or lose weight. Your breasts may grow larger and hurt. The area around your nipples may get darker. Dark spots or blotches may develop on your face. You may have changes in your hair. Health changes You may feel like you might vomit (nauseous), and you may vomit. You may have heartburn. You may have headaches. You may have trouble pooping (constipation). Your  gums may bleed. Other changes You may get tired easily. You may pee (urinate) more often. Your menstrual periods will stop. You may not feel hungry. You may want to eat certain kinds of food. You may have changes in your emotions from day to day. You may have more dreams. Follow these instructions at home: Medicines Take over-the-counter and prescription medicines only as told by your doctor. Some medicines are not safe during pregnancy. Take a prenatal vitamin that contains at least 600 micrograms (mcg) of folic acid. Eating and drinking Eat healthy meals that include: Fresh fruits and vegetables. Whole grains. Good sources of protein, such as meat, eggs, or tofu. Low-fat dairy products. Avoid raw meat and unpasteurized juice, milk, and cheese. If you feel like you may vomit, or you vomit: Eat 4 or 5 small meals a day instead of 3 large meals. Try eating a few soda crackers. Drink liquids between meals instead of during meals. You may need to take these actions to prevent or treat trouble pooping: Drink enough fluids to keep your pee (urine) pale yellow. Eat foods that are high in fiber. These include beans, whole grains, and fresh fruits and vegetables. Limit foods that are high in fat and sugar. These include fried or sweet foods. Activity Exercise only as told by your doctor. Most people can do their usual exercise routine during pregnancy. Stop exercising if you have cramps or pain in your lower belly (abdomen) or low back. Do not exercise if it is too hot or too humid, or if you are in a place of great height (high altitude). Avoid heavy lifting. If you choose to, you may have sex unless your doctor tells you not to. Relieving pain and discomfort Wear a good support bra if your breasts are sore. Rest with your legs raised (elevated) if you have leg cramps or low back pain. If you have bulging veins (varicose veins) in your legs: Wear support hose as told by your  doctor. Raise your feet for 15 minutes, 3-4 times a day. Limit salt in your food. Safety Wear your seat belt at all times when you are in a car. Talk with your doctor if someone is hurting you or yelling at you. Talk with your doctor if you are feeling sad or have thoughts of hurting yourself. Lifestyle Do not use hot tubs, steam rooms, or saunas. Do not douche. Do not use tampons or scented sanitary pads. Do not use herbal medicines, illegal drugs, or medicines that are not approved by your doctor. Do not drink alcohol. Do not smoke or use any products that contain nicotine or tobacco. If you need help quitting, ask your doctor. Avoid cat litter boxes and soil that is used by cats. These carry germs that can cause harm to the baby and can cause a loss of your baby by miscarriage or stillbirth. General instructions Keep all follow-up visits. This is important. Ask for help if you need counseling or if you need help with   nutrition. Your doctor can give you advice or tell you where to go for help. Visit your dentist. At home, brush your teeth with a soft toothbrush. Floss gently. Write down your questions. Take them to your prenatal visits. Where to find more information American Pregnancy Association: americanpregnancy.org American College of Obstetricians and Gynecologists: www.acog.org Office on Women's Health: womenshealth.gov/pregnancy Contact a doctor if: You are dizzy. You have a fever. You have mild cramps or pressure in your lower belly. You have a nagging pain in your belly area. You continue to feel like you may vomit, you vomit, or you have watery poop (diarrhea) for 24 hours or longer. You have a bad-smelling fluid coming from your vagina. You have pain when you pee. You are exposed to a disease that spreads from person to person, such as chickenpox, measles, Zika virus, HIV, or hepatitis. Get help right away if: You have spotting or bleeding from your vagina. You have  very bad belly cramping or pain. You have shortness of breath or chest pain. You have any kind of injury, such as from a fall or a car crash. You have new or increased pain, swelling, or redness in an arm or leg. Summary The first trimester of pregnancy starts on the first day of your last menstrual period until the end of week 12 (months 1 through 3). Eat 4 or 5 small meals a day instead of 3 large meals. Do not smoke or use any products that contain nicotine or tobacco. If you need help quitting, ask your doctor. Keep all follow-up visits. This information is not intended to replace advice given to you by your health care provider. Make sure you discuss any questions you have with your health care provider. Document Revised: 05/18/2020 Document Reviewed: 03/24/2020 Elsevier Patient Education  2024 Elsevier Inc. Commonly Asked Questions During Pregnancy  Cats: A parasite can be excreted in cat feces.  To avoid exposure you need to have another person empty the little box.  If you must empty the litter box you will need to wear gloves.  Wash your hands after handling your cat.  This parasite can also be found in raw or undercooked meat so this should also be avoided.  Colds, Sore Throats, Flu: Please check your medication sheet to see what you can take for symptoms.  If your symptoms are unrelieved by these medications please call the office.  Dental Work: Most any dental work your dentist recommends is permitted.  X-rays should only be taken during the first trimester if absolutely necessary.  Your abdomen should be shielded with a lead apron during all x-rays.  Please notify your provider prior to receiving any x-rays.  Novocaine is fine; gas is not recommended.  If your dentist requires a note from us prior to dental work please call the office and we will provide one for you.  Exercise: Exercise is an important part of staying healthy during your pregnancy.  You may continue most exercises  you were accustomed to prior to pregnancy.  Later in your pregnancy you will most likely notice you have difficulty with activities requiring balance like riding a bicycle.  It is important that you listen to your body and avoid activities that put you at a higher risk of falling.  Adequate rest and staying well hydrated are a must!  If you have questions about the safety of specific activities ask your provider.    Exposure to Children with illness: Try to avoid obvious exposure; report any   symptoms to us when noted,  If you have chicken pos, red measles or mumps, you should be immune to these diseases.   Please do not take any vaccines while pregnant unless you have checked with your OB provider.  Fetal Movement: After 28 weeks we recommend you do "kick counts" twice daily.  Lie or sit down in a calm quiet environment and count your baby movements "kicks".  You should feel your baby at least 10 times per hour.  If you have not felt 10 kicks within the first hour get up, walk around and have something sweet to eat or drink then repeat for an additional hour.  If count remains less than 10 per hour notify your provider.  Fumigating: Follow your pest control agent's advice as to how long to stay out of your home.  Ventilate the area well before re-entering.  Hemorrhoids:   Most over-the-counter preparations can be used during pregnancy.  Check your medication to see what is safe to use.  It is important to use a stool softener or fiber in your diet and to drink lots of liquids.  If hemorrhoids seem to be getting worse please call the office.   Hot Tubs:  Hot tubs Jacuzzis and saunas are not recommended while pregnant.  These increase your internal body temperature and should be avoided.  Intercourse:  Sexual intercourse is safe during pregnancy as long as you are comfortable, unless otherwise advised by your provider.  Spotting may occur after intercourse; report any bright red bleeding that is heavier  than spotting.  Labor:  If you know that you are in labor, please go to the hospital.  If you are unsure, please call the office and let us help you decide what to do.  Lifting, straining, etc:  If your job requires heavy lifting or straining please check with your provider for any limitations.  Generally, you should not lift items heavier than that you can lift simply with your hands and arms (no back muscles)  Painting:  Paint fumes do not harm your pregnancy, but may make you ill and should be avoided if possible.  Latex or water based paints have less odor than oils.  Use adequate ventilation while painting.  Permanents & Hair Color:  Chemicals in hair dyes are not recommended as they cause increase hair dryness which can increase hair loss during pregnancy.  " Highlighting" and permanents are allowed.  Dye may be absorbed differently and permanents may not hold as well during pregnancy.  Sunbathing:  Use a sunscreen, as skin burns easily during pregnancy.  Drink plenty of fluids; avoid over heating.  Tanning Beds:  Because their possible side effects are still unknown, tanning beds are not recommended.  Ultrasound Scans:  Routine ultrasounds are performed at approximately 20 weeks.  You will be able to see your baby's general anatomy an if you would like to know the gender this can usually be determined as well.  If it is questionable when you conceived you may also receive an ultrasound early in your pregnancy for dating purposes.  Otherwise ultrasound exams are not routinely performed unless there is a medical necessity.  Although you can request a scan we ask that you pay for it when conducted because insurance does not cover " patient request" scans.  Work: If your pregnancy proceeds without complications you may work until your due date, unless your physician or employer advises otherwise.  Round Ligament Pain/Pelvic Discomfort:  Sharp, shooting pains not associated   with bleeding are  fairly common, usually occurring in the second trimester of pregnancy.  They tend to be worse when standing up or when you remain standing for long periods of time.  These are the result of pressure of certain pelvic ligaments called "round ligaments".  Rest, Tylenol and heat seem to be the most effective relief.  As the womb and fetus grow, they rise out of the pelvis and the discomfort improves.  Please notify the office if your pain seems different than that described.  It may represent a more serious condition.  Common Medications Safe in Pregnancy  Acne:      Constipation:  Benzoyl Peroxide     Colace  Clindamycin      Dulcolax Suppository  Topica Erythromycin     Fibercon  Salicylic Acid      Metamucil         Miralax AVOID:        Senakot   Accutane    Cough:  Retin-A       Cough Drops  Tetracycline      Phenergan w/ Codeine if Rx  Minocycline      Robitussin (Plain & DM)  Antibiotics:     Crabs/Lice:  Ceclor       RID  Cephalosporins    AVOID:  E-Mycins      Kwell  Keflex  Macrobid/Macrodantin   Diarrhea:  Penicillin      Kao-Pectate  Zithromax      Imodium AD         PUSH FLUIDS AVOID:       Cipro     Fever:  Tetracycline      Tylenol (Regular or Extra  Minocycline       Strength)  Levaquin      Extra Strength-Do not          Exceed 8 tabs/24 hrs Caffeine:        <200mg/day (equiv. To 1 cup of coffee or  approx. 3 12 oz sodas)         Gas: Cold/Hayfever:       Gas-X  Benadryl      Mylicon  Claritin       Phazyme  **Claritin-D        Chlor-Trimeton    Headaches:  Dimetapp      ASA-Free Excedrin  Drixoral-Non-Drowsy     Cold Compress  Mucinex (Guaifenasin)     Tylenol (Regular or Extra  Sudafed/Sudafed-12 Hour     Strength)  **Sudafed PE Pseudoephedrine   Tylenol Cold & Sinus     Vicks Vapor Rub  Zyrtec  **AVOID if Problems With Blood Pressure         Heartburn: Avoid lying down for at least 1 hour after meals  Aciphex      Maalox     Rash:  Milk of  Magnesia     Benadryl    Mylanta       1% Hydrocortisone Cream  Pepcid  Pepcid Complete   Sleep Aids:  Prevacid      Ambien   Prilosec       Benadryl  Rolaids       Chamomile Tea  Tums (Limit 4/day)     Unisom         Tylenol PM         Warm milk-add vanilla or  Hemorrhoids:       Sugar for taste  Anusol/Anusol H.C.  (RX: Analapram 2.5%)  Sugar Substitutes:    Hydrocortisone OTC     Ok in moderation  Preparation H      Tucks        Vaseline lotion applied to tissue with wiping    Herpes:     Throat:  Acyclovir      Oragel  Famvir  Valtrex     Vaccines:         Flu Shot Leg Cramps:       *Gardasil  Benadryl      Hepatitis A         Hepatitis B Nasal Spray:       Pneumovax  Saline Nasal Spray     Polio Booster         Tetanus Nausea:       Tuberculosis test or PPD  Vitamin B6 25 mg TID   AVOID:    Dramamine      *Gardasil  Emetrol       Live Poliovirus  Ginger Root 250 mg QID    MMR (measles, mumps &  High Complex Carbs @ Bedtime    rebella)  Sea Bands-Accupressure    Varicella (Chickenpox)  Unisom 1/2 tab TID     *No known complications           If received before Pain:         Known pregnancy;   Darvocet       Resume series after  Lortab        Delivery  Percocet    Yeast:   Tramadol      Femstat  Tylenol 3      Gyne-lotrimin  Ultram       Monistat  Vicodin           MISC:         All Sunscreens           Hair Coloring/highlights          Insect Repellant's          (Including DEET)         Mystic Tans  

## 2023-12-06 ENCOUNTER — Telehealth: Payer: Self-pay | Admitting: Licensed Practical Nurse

## 2023-12-06 ENCOUNTER — Encounter: Payer: Managed Care, Other (non HMO) | Admitting: Obstetrics

## 2023-12-06 NOTE — Telephone Encounter (Signed)
The patient missed scheduled appointment for 12/06/23 for routine visit with [facility]. I contact the patient via phone. No answer, voicemail is full unable to leave message. Asking the patient to call the office for rescheduling

## 2023-12-09 ENCOUNTER — Encounter: Payer: Self-pay | Admitting: Obstetrics

## 2023-12-09 NOTE — Telephone Encounter (Signed)
I contact the patient via phone. No answer, voicemail is full unable to leave message. Asking the patient to call the office for rescheduling

## 2023-12-10 ENCOUNTER — Encounter: Payer: Managed Care, Other (non HMO) | Admitting: Licensed Practical Nurse

## 2023-12-10 ENCOUNTER — Telehealth: Payer: Self-pay | Admitting: Licensed Practical Nurse

## 2023-12-10 DIAGNOSIS — Z3A21 21 weeks gestation of pregnancy: Secondary | ICD-10-CM

## 2023-12-10 DIAGNOSIS — Z3480 Encounter for supervision of other normal pregnancy, unspecified trimester: Secondary | ICD-10-CM

## 2023-12-10 NOTE — Telephone Encounter (Signed)
Reached out to pt to reschedule NOB appt that was scheduled on 11/24/2023 at 1:15.  Could not leave a message bc the mailbox was full.

## 2023-12-11 ENCOUNTER — Encounter: Payer: Self-pay | Admitting: Licensed Practical Nurse

## 2023-12-11 NOTE — Telephone Encounter (Signed)
Reached out to pt (2x) to reschedule NOB appt that was scheduled on 12/10/2023 at 1:15.  Could not leave a message bc mailbox was full.  Will send a MyChart letter to pt.

## 2023-12-19 ENCOUNTER — Ambulatory Visit
Admission: RE | Admit: 2023-12-19 | Discharge: 2023-12-19 | Disposition: A | Discharge status: Home / Self Care | Payer: Managed Care, Other (non HMO) | Source: Ambulatory Visit | Attending: Licensed Practical Nurse | Admitting: Licensed Practical Nurse

## 2023-12-19 DIAGNOSIS — Z3A22 22 weeks gestation of pregnancy: Secondary | ICD-10-CM | POA: Diagnosis not present

## 2023-12-19 DIAGNOSIS — Z3482 Encounter for supervision of other normal pregnancy, second trimester: Secondary | ICD-10-CM | POA: Diagnosis not present

## 2023-12-19 DIAGNOSIS — Z369 Encounter for antenatal screening, unspecified: Secondary | ICD-10-CM | POA: Insufficient documentation

## 2023-12-19 DIAGNOSIS — Z3480 Encounter for supervision of other normal pregnancy, unspecified trimester: Secondary | ICD-10-CM | POA: Diagnosis present

## 2023-12-29 ENCOUNTER — Other Ambulatory Visit: Payer: Self-pay | Admitting: Licensed Practical Nurse

## 2023-12-29 DIAGNOSIS — O0990 Supervision of high risk pregnancy, unspecified, unspecified trimester: Secondary | ICD-10-CM

## 2023-12-29 DIAGNOSIS — I10 Essential (primary) hypertension: Secondary | ICD-10-CM

## 2023-12-29 NOTE — Progress Notes (Signed)
 Anatomy US  shows  Triangular, anechoic area arising from the falx into the posterior horn of the right ventricle. Etiology is indeterminate. MFM consultation is recommended. Referral to MFM placed Jinnie Cookey, EDDY  [facility]  12/29/23  12:38 AM

## 2024-02-19 ENCOUNTER — Ambulatory Visit: Payer: Medicaid Other | Attending: Maternal & Fetal Medicine

## 2024-02-19 ENCOUNTER — Encounter: Payer: Self-pay | Admitting: Licensed Practical Nurse

## 2024-02-19 ENCOUNTER — Other Ambulatory Visit: Payer: Self-pay

## 2024-02-19 ENCOUNTER — Ambulatory Visit (HOSPITAL_BASED_OUTPATIENT_CLINIC_OR_DEPARTMENT_OTHER): Payer: Medicaid Other | Admitting: Maternal & Fetal Medicine

## 2024-02-19 DIAGNOSIS — O359XX0 Maternal care for (suspected) fetal abnormality and damage, unspecified, not applicable or unspecified: Secondary | ICD-10-CM | POA: Diagnosis not present

## 2024-02-19 DIAGNOSIS — O283 Abnormal ultrasonic finding on antenatal screening of mother: Secondary | ICD-10-CM

## 2024-02-19 DIAGNOSIS — Z363 Encounter for antenatal screening for malformations: Secondary | ICD-10-CM | POA: Insufficient documentation

## 2024-02-19 DIAGNOSIS — O35EXX0 Maternal care for other (suspected) fetal abnormality and damage, fetal genitourinary anomalies, not applicable or unspecified: Secondary | ICD-10-CM | POA: Diagnosis not present

## 2024-02-19 DIAGNOSIS — Z3A31 31 weeks gestation of pregnancy: Secondary | ICD-10-CM

## 2024-02-19 DIAGNOSIS — O0990 Supervision of high risk pregnancy, unspecified, unspecified trimester: Secondary | ICD-10-CM

## 2024-02-19 DIAGNOSIS — O0993 Supervision of high risk pregnancy, unspecified, third trimester: Secondary | ICD-10-CM | POA: Insufficient documentation

## 2024-02-19 NOTE — Progress Notes (Signed)
 MFM consultation  Lydia Kennedy is a G3P2 who is here at 31w 2d with a EDD of 04/20/24.  She is seen at the request of Lydia Kennedy regarding an out side exam concerned an intracranial finding.   She has only one visit and had not had genetic screening per Lydia records.  The pregnancy overall is has been uncomplicated.   Single intrauterine pregnancy here for a detailed anatomy due to suspected intracranial defect (not visualized today).  Normal anatomy with measurements consistent with dates There is good fetal movement and amniotic fluid volume Suboptimal views of the fetal anatomy were obtained secondary to fetal position and advanced gestational age.   I discussed with Lydia Kennedy and Lydia signficant Kennedy of today's finding of left unilateral renal pyelectasis with a measurement of 8.8 mm. I discussed the etiology of renal pyelectasis to include normal variant, ureteropelvic or vesicle junction obstruction and urethrovesicle reflux. Prior to 28 weeks the threshold for abnormal is <61mm but after 28 weeks >7 mm. The renal pelvis is an SFU grade 1 appearnace without renal caliectasis.  I reviewed the isolated nature of today's findings and discussed the associated with down syndrome when Kennedy markers of aneuploidy are present.   Secondly we reviewed the intracranial that is normal the "diamond shaped " area off of the falx is the parieto-occiptal fissure which is normal in pregnancy. No additional findings were noted. The lateral ventricles appear normal.   I spent 30 minutes with > 50% in face to face consultation  Novella Olive, MD

## 2024-03-05 ENCOUNTER — Inpatient Hospital Stay
Admission: EM | Admit: 2024-03-05 | Discharge: 2024-03-05 | Disposition: A | Discharge status: Home / Self Care | Attending: Obstetrics and Gynecology | Admitting: Obstetrics and Gynecology

## 2024-03-05 ENCOUNTER — Encounter: Payer: Self-pay | Admitting: Obstetrics and Gynecology

## 2024-03-05 ENCOUNTER — Other Ambulatory Visit: Payer: Self-pay

## 2024-03-05 DIAGNOSIS — Z3480 Encounter for supervision of other normal pregnancy, unspecified trimester: Secondary | ICD-10-CM

## 2024-03-05 DIAGNOSIS — R1030 Lower abdominal pain, unspecified: Secondary | ICD-10-CM | POA: Diagnosis present

## 2024-03-05 DIAGNOSIS — O26893 Other specified pregnancy related conditions, third trimester: Secondary | ICD-10-CM | POA: Diagnosis not present

## 2024-03-05 DIAGNOSIS — Z3A33 33 weeks gestation of pregnancy: Secondary | ICD-10-CM | POA: Diagnosis not present

## 2024-03-05 LAB — WET PREP, GENITAL
Clue Cells Wet Prep HPF POC: NONE SEEN
Sperm: NONE SEEN
Trich, Wet Prep: NONE SEEN
WBC, Wet Prep HPF POC: <10 (ref <10)
Yeast Wet Prep HPF POC: NONE SEEN

## 2024-03-05 LAB — URINALYSIS, ROUTINE W REFLEX MICROSCOPIC
Bilirubin Urine: NEGATIVE
Glucose, UA: NEGATIVE mg/dL
Hgb urine dipstick: NEGATIVE
Ketones, ur: NEGATIVE mg/dL
Leukocytes,Ua: NEGATIVE
Nitrite: NEGATIVE
Protein, ur: NEGATIVE mg/dL
Specific Gravity, Urine: 1.021 (ref 1.005–1.030)
pH: 6 (ref 5.0–8.0)

## 2024-03-05 LAB — CHLAMYDIA/NGC RT PCR (ARMC ONLY)
Chlamydia Tr: NOT DETECTED
N gonorrhoeae: NOT DETECTED

## 2024-03-05 LAB — GROUP B STREP BY PCR: Group B strep by PCR: NEGATIVE

## 2024-03-05 NOTE — OB Triage Note (Signed)
Pt discharged home per order.   Pt stable and ambulatory and an After Visit Summary was printed and given to the patient. Discharge education completed with patient/family including follow up instructions, appointments, and medication list. Pt received labor and bleeding precautions. Patient able to verbalize understanding, all questions fully answered upon discharge.   Pt discharged home via personal vehicle with support person.  

## 2024-03-05 NOTE — OB Triage Note (Addendum)
 Pt reports to labor and delivery with complaints of lower abdominal pain. She describes it as a burning sensation near her c-section scar. Rates pain as 8/10. RN touched patient's C/S scar and pt denied pain. RN applied pressure to the tissue near patient's C/S scar and patient denied pain. Denies vaginal bleeding and LOF. States positive fetal movement. Notes inconsistent contractions that started within the last week or 2. EFM and toco applied and assessing.

## 2024-03-05 NOTE — OB Triage Note (Signed)
 LABOR & DELIVERY OB TRIAGE NOTE  SUBJECTIVE  HPI Lydia Kennedy is a 33 y.o. G3P2002 at [redacted]w[redacted]d who presents to Labor & Delivery for burning sensation below her c/s scar. She first felt the pain a week ago and has felt it intermittently most days since. Last felt it this morning. Last cesearan section was in May 2025.  Denies LOF, bleeding. Endorses fetal movement  Additionally, she has not been seen in clinic this pregnancy. She does not offer a reason why. Advised NOB labs and PIH labs today. Patient declines all blood draw lab work. Will make appointment for next week in [facility] clinic.   OB History     Gravida  3   Para  2   Term  2   Preterm      AB      Living  2      SAB      IAB      Ectopic      Multiple  0   Live Births  2           OBJECTIVE  BP 121/78 (BP Location: Left Arm)   Pulse 92   Temp 97.8 F (36.6 C) (Oral)   Resp 16   Ht 5\' 5"  (1.651 m)   Wt 78 kg   LMP  (LMP Unknown)   BMI 28.62 kg/m   General: A&O x3 Lungs:normal work of breathing Abdomen: soft, non-tender GRAVID   NST I reviewed the NST and it was reactive.  Baseline: 145 Variability: moderate Accelerations: 15x15 Decelerations:none Toco: irritability (patient unaware) Category  ASSESSMENT Impression  1) Pregnancy at [id number], [redacted]w[redacted]d, Estimated Date of Delivery: 04/20/24 2) Reassuring maternal/fetal status 3) Previous surgical site pain with growing uterus. 4)Wet prep, GC/Chlamydia, UA and GBS collected. Urine culture pending.  4)US visits only this pregnancy.   PLAN 1)Warmth or ice for comfort. Pregnancy support belt may help. 2)Strongly encouraged to call [facility] for clinic visit this week. Will need to see MD for TOLAC versus repeat c/s plan. Needs all NOB and 28 week labs. 3) Advised patient to wait for results but patient declines and is requesting discharge. Patient discharge with precautions. Will send in medication for any positive results.    Oley Balm, CNM  03/05/24  4:56 PM

## 2024-03-07 LAB — URINE CULTURE: Culture: 10000 — AB

## 2024-03-09 ENCOUNTER — Other Ambulatory Visit: Payer: Self-pay | Admitting: Certified Nurse Midwife

## 2024-03-09 MED ORDER — NITROFURANTOIN MONOHYD MACRO 100 MG PO CAPS: 100.0000 mg | ORAL_CAPSULE | Freq: Two times a day (BID) | ORAL | 0 refills | Status: AC

## 2024-04-16 ENCOUNTER — Encounter: Payer: Self-pay | Admitting: Licensed Practical Nurse

## 2024-04-17 NOTE — Telephone Encounter (Signed)
 TRIAGE VOICEMAIL: Patient requesting to schedule C-Section. Her EDD is 4/28. She doesn't know if it is safe for her to go over considering her last pregnancy was high risk with C-Section at 39 weeks. She states her pregnancies were back to back. Requesting return call.

## 2024-04-20 ENCOUNTER — Encounter: Payer: Self-pay | Admitting: Obstetrics

## 2024-04-20 ENCOUNTER — Ambulatory Visit (INDEPENDENT_AMBULATORY_CARE_PROVIDER_SITE_OTHER): Admitting: Obstetrics

## 2024-04-20 VITALS — BP 136/78 | HR 89 | Wt 193.0 lb

## 2024-04-20 DIAGNOSIS — O34219 Maternal care for unspecified type scar from previous cesarean delivery: Secondary | ICD-10-CM | POA: Insufficient documentation

## 2024-04-20 DIAGNOSIS — O0993 Supervision of high risk pregnancy, unspecified, third trimester: Secondary | ICD-10-CM | POA: Diagnosis not present

## 2024-04-20 DIAGNOSIS — O09893 Supervision of other high risk pregnancies, third trimester: Secondary | ICD-10-CM | POA: Insufficient documentation

## 2024-04-20 DIAGNOSIS — O10919 Unspecified pre-existing hypertension complicating pregnancy, unspecified trimester: Secondary | ICD-10-CM

## 2024-04-20 DIAGNOSIS — O0933 Supervision of pregnancy with insufficient antenatal care, third trimester: Secondary | ICD-10-CM

## 2024-04-20 DIAGNOSIS — Z3A40 40 weeks gestation of pregnancy: Secondary | ICD-10-CM

## 2024-04-20 DIAGNOSIS — O10013 Pre-existing essential hypertension complicating pregnancy, third trimester: Secondary | ICD-10-CM | POA: Diagnosis not present

## 2024-04-20 DIAGNOSIS — O480 Post-term pregnancy: Secondary | ICD-10-CM

## 2024-04-20 NOTE — Progress Notes (Signed)
 Obstetric Initial Prenatal Note   Subjective  33 y.o. G3P2002 at [redacted]w[redacted]d presents with her husband and daughter to establish prenatal care. This was not a planned pregnancy. EDD 04/20/24 by a 13wk US , LMP is unknown. Her obstetrical history is significant for chronic HTN currently in prior two pregnancies, takes Nifedipine  30mg  daily, and history of prior cesarean x 2, see OB history below. Her past medical history is significant for anxiety and ADHD, for which she takes daily Xanax  and Adderall.   Pt delivered by Dr. Luster Salters for prior two deliveries but has not been seen this pregnancy in clinic, had one triage visit. States schedules were too difficult. She desires a repeat c-section and would like to schedule that today. Defers cervical exam.   Seen in OBT 03/05/24 and the following labs were done: -GBS: NEG -Ucx: NEG -Wet prep: NEG -GC/CT: NEG She declined all other labs at that time and was encouraged to start prenatal care.   She is planning to formula feed, but would like to try breastfeeding, and states this is "for sure" her "last baby" but is unsure on contraception. Is considering Mirena, declines placement at time of c-section. Declines BTL.   Patient reports: Movement: Present Contractions: Irregular Denies vaginal bleeding or leaking fluid.  OB History  Gravida Para Term Preterm AB Living  3 2 2  0 0 2  SAB IAB Ectopic Multiple Live Births  0 0 0 0 2    # Outcome Date GA Lbr Len/2nd Weight Sex Type Anes PTL Lv  3 Current           2 Term 05/13/23 [redacted]w[redacted]d  6 lb 15.1 oz (3.15 kg) F CS-LTranv Spinal  LIV     Name: Bomar,GIRL Evellyn     Apgar1: 8  Apgar5: 9  1 Term 07/12/11 [redacted]w[redacted]d  7 lb 15 oz (3.6 kg) F CS-LTranv   LIV     Complications: Failure to Progress in Second Stage     Name: zayli   Gynecologic History:  Menarche: unsure LMP: unsure Periods are Q30-40d, last 7 days, flow is moderate.  Last pap smear was 07/06/20.  Results were Normal.  Denies h/o abnormal  pap smears in the past.  Denies history of STIs.  Contraception prior to conception: none  Past Medical History:  Diagnosis Date   Acute gastritis without hemorrhage    ADHD    Anemia    Anxiety    Chronic kidney disease    kidney reflux, problem more as child/ recuurent interstitial cystitis   Essential hypertension    Family history of breast cancer    7/21 cancer genetic testing letter sent   GERD (gastroesophageal reflux disease)    Headache    daily   History of cesarean delivery affecting pregnancy 11/04/2022   IC (interstitial cystitis)    Infection    UTI   Labor and delivery, indication for care 03/29/2023   Maternal chronic hypertension, third trimester 05/01/2023   Pregnancy 05/13/2023   Supervision of high-risk pregnancy 10/12/2022              Clinical Staff    Provider      Office Location     [facility]    Dating     Not found.      Language     english    Anatomy US             Flu Vaccine     Declined    Genetic  Screen     NIPS: Negative/Female: 10/19/22      TDaP vaccine      Declined       Hgb A1C or   GTT    Early :  Third trimester : 121 (03/06/2023)      Covid    Declined          LAB RESULTS       Rhogam     A/P   Thyroid dysfunction    Thyroid nodule    Tonsillitis     Family History  Problem Relation Age of Onset   Breast cancer Mother 10   Hypertension Father    Healthy Brother    Pancreatic cancer Maternal Grandmother 39   Healthy Maternal Grandfather    Healthy Paternal Grandmother    Heart attack Paternal Grandfather     Past Surgical History:  Procedure Laterality Date   BREAST ENHANCEMENT SURGERY Bilateral 2016   CESAREAN SECTION  2012   CESAREAN SECTION N/A 05/13/2023   Procedure: REPEAT CESAREAN SECTION;  Surgeon: Zenobia Hila, MD;  Location: [facility];  Service: Obstetrics;  Laterality: N/A;   ESOPHAGOGASTRODUODENOSCOPY (EGD) WITH PROPOFOL  N/A 07/07/2018   Procedure: ESOPHAGOGASTRODUODENOSCOPY (EGD) WITH PROPOFOL ;  Surgeon:  Marnee Sink, MD;  Location: [facility];  Service: Endoscopy;  Laterality: N/A;   TONSILLECTOMY N/A 01/29/2018   Procedure: TONSILLECTOMY;  Surgeon: Rogers Clayman, MD;  Location: [facility];  Service: ENT;  Laterality: N/A;  UPREG   WISDOM TOOTH EXTRACTION     four; 2018    Social History   Socioeconomic History   Marital status: Married    Spouse name: Merchant navy officer   Number of children: 2   Years of education: 14   Highest education level: Associate degree: academic program  Occupational History   Occupation: Investment banker, corporate    Comment: [employer]  Tobacco Use   Smoking status: Former    Current packs/day: 0.00    Types: Cigarettes    Quit date: 05/13/2023    Years since quitting: 0.9   Smokeless tobacco: Never  Vaping Use   Vaping status: Some Days   Substances: Flavoring  Substance and Sexual Activity   Alcohol use: Not Currently    Alcohol/week: 3.0 standard drinks of alcohol    Types: 3 Standard drinks or equivalent per week    Comment: occ   Drug use: No   Sexual activity: Yes    Partners: Male    Birth control/protection: None  Other Topics Concern   Not on file  Social History Narrative   Not on file   Social Drivers of Health   Financial Resource Strain: Low Risk  (12/05/2023)   Overall Financial Resource Strain (CARDIA)    Difficulty of Paying Living Expenses: Not very hard  Food Insecurity: No Food Insecurity (12/05/2023)   Hunger Vital Sign    Worried About Running Out of Food in the Last Year: Never true    Ran Out of Food in the Last Year: Never true  Transportation Needs: No Transportation Needs (12/05/2023)   PRAPARE - Administrator, Civil Service (Medical): No    Lack of Transportation (Non-Medical): No  Physical Activity: Insufficiently Active (12/05/2023)   Exercise Vital Sign    Days of Exercise per Week: 2 days    Minutes of Exercise per Session: 30 min  Stress: No Stress Concern Present (12/05/2023)    [employer] of Occupational Health - Occupational Stress Questionnaire    Feeling  of Stress : Not at all  Social Connections: Moderately Integrated (12/05/2023)   Social Connection and Isolation Panel [NHANES]    Frequency of Communication with Friends and Family: More than three times a week    Frequency of Social Gatherings with Friends and Family: Twice a week    Attends Religious Services: More than 4 times per year    Active Member of [employer] or Organizations: No    Attends Banker Meetings: Never    Marital Status: Married  Catering manager Violence: Not At Risk (12/05/2023)   Humiliation, Afraid, Rape, and Kick questionnaire    Fear of Current or Ex-Partner: No    Emotionally Abused: No    Physically Abused: No    Sexually Abused: No    Current Outpatient Medications on File Prior to Visit  Medication Sig Dispense Refill   acetaminophen  (TYLENOL ) 325 MG tablet Take 2 tablets (650 mg total) by mouth every 4 (four) hours as needed for mild pain (temperature > 101.5.).     alprazolam  (XANAX ) 2 MG tablet Take 2 mg by mouth 2 (two) times daily.     NIFEdipine  (PROCARDIA -XL/NIFEDICAL-XL) 30 MG 24 hr tablet Take 1 tablet (30 mg total) by mouth at bedtime. Can increase to twice a day as needed for symptomatic contractions 30 tablet 6   No current facility-administered medications on file prior to visit.    Allergies  Allergen Reactions   Cephalexin  Anaphylaxis   Elemental Sulfur Shortness Of Breath    Other reaction(s): Muscle Pain   Penicillins Anaphylaxis and Shortness Of Breath    Throat swelling   Sulfa  Antibiotics Anaphylaxis   Chlorhexidine  Gluconate Rash   Review of Systems General: Not Present- Fever, Weight Loss and Weight Gain. Skin: Not Present- Rash. HEENT: Not Present- Blurred Vision, Headache and Bleeding Gums. Respiratory: Not Present- Difficulty Breathing. Breast: Not Present- Breast Mass. Cardiovascular: Not Present- Chest Pain, Elevated  Blood Pressure, Fainting / Blacking Out and Shortness of Breath. Gastrointestinal: Not Present- Abdominal Pain, Constipation, Nausea and Vomiting. Female Genitourinary: Not Present- Frequency, Painful Urination, Pelvic Pain, Vaginal Bleeding, Vaginal Discharge, Contractions, regular, Fetal Movements Decreased, Urinary Complaints and Vaginal Fluid. Musculoskeletal: Not Present- Back Pain and Leg Cramps. Neurological: Not Present- Dizziness. Psychiatric: Not Present- Depression.     Objective:  Flow sheet Vitals: Pulse Rate: 89 BP: 136/78 Fundal Height: 38 cm Fetal Heart Rate (bpm): 145 Total weight gain: 33 lb (15 kg)  General Appearance:    Alert, cooperative, no distress, appears stated age  Head:    Normocephalic, without obvious abnormality, atraumatic  Eyes:    PERRL, conjunctiva/corneas clear, EOM's intact, both eyes  Ears:    Normal external ear canals, both ears  Nose:   Nares normal, septum midline, mucosa normal, no drainage or sinus tenderness  Throat:   Lips, mucosa, and tongue normal; teeth and gums normal  Neck:   Supple, symmetrical, trachea midline, no adenopathy; thyroid: no enlargement/tenderness/nodules; no carotid bruit or JVD  Back:     Symmetric, no curvature, ROM normal, no CVA tenderness  Lungs:     Clear to auscultation bilaterally, respirations unlabored  Chest Wall:    No tenderness or deformity   Heart:    Regular rate and rhythm, S1 and S2 normal, no murmur, rub or gallop  Breast Exam:    No tenderness, masses, or nipple abnormality  Abdomen:     Soft, non-tender, bowel sounds active all four quadrants, no masses, no organomegaly.  FHT 145  bpm.  Genitalia:    Pelvic:external genitalia normal, vagina without lesions, discharge, or tenderness, rectovaginal septum  normal. Cervix normal in appearance, no cervical motion tenderness, no adnexal masses or tenderness.  Pregnancy positive findings: uterine enlargement: 38 wk size, nontender.   Rectal:    Normal  external sphincter.  No hemorrhoids appreciated. Internal exam not done.   Extremities:   Extremities normal, atraumatic, no cyanosis or edema  Pulses:   2+ and symmetric all extremities  Skin:   Skin color, texture, turgor normal, no rashes or lesions  Lymph nodes:   Cervical, supraclavicular, and axillary nodes normal  Neurologic:   CNII-XII intact, normal strength, sensation and reflexes throughout   Assessment/Plan   Plan  33 y.o. G3P2002 at [redacted]w[redacted]d by 13wk US  presents today for her first prenatal visit.   1. Supervision of high risk pregnancy in third trimester (Primary) 2. No prenatal care in current pregnancy in third trimester - Initial labs ordered today: NOB panel, baseline PIH labs, UP:C. - Problem list reviewed and updated. - Benefits of Breast Feeding were discussed. The patient is encouraged to consider nursing her baby post partum, will consult lactation - Social work consult recommended during hospital admission  3. Fetal drug exposure -Exposure to Xanax  and Adderall during pregnancy -Patient verbally agreed to UDS today, left without leaving urine sample -UDS on admission to hospital  4. HTN in pregnancy, chronic - Stable on Nifedipine  30mg  daily, continue - Delivery scheduled in 48hrs  5. History of cesarean delivery, currently pregnant - Desires repeat, scheduled for 4/30 at 7:30, [redacted]w[redacted]d - Pt with allergies to PCN and cephalosporins, plan for Clindamycin /Genamicin - Also allergic to Chlorhexidine , plan for betadine /iodine  as surgical prep  Orders Placed This Encounter  Procedures   RPR+Rh+ABO+Rub Ab+Ab Scr+CB...   Return in about 10 days (around 04/30/2024) for Post-op C/S incision check.   Future Appointments  Date Time Provider Department Center  05/01/2024  2:55 PM Sofia Dunn, MD [facility] None    For next visit:   rCD scheduled on 4/30  w/Dr. Dell Fennel  Total time was 35 minutes. That includes chart review before the visit, the actual patient visit, and time  spent on documentation after the visit. Time excludes procedures, if any.     Sofia Dunn, DO [facility]

## 2024-04-21 LAB — RPR+RH+ABO+RUB AB+AB SCR+CB...
Antibody Screen: NEGATIVE
HIV Screen 4th Generation wRfx: NONREACTIVE
Hematocrit: 31.3 % — ABNORMAL LOW (ref 34.0–46.6)
Hemoglobin: 10.1 g/dL — ABNORMAL LOW (ref 11.1–15.9)
Hepatitis B Surface Ag: NEGATIVE
MCH: 24.2 pg — ABNORMAL LOW (ref 26.6–33.0)
MCHC: 32.3 g/dL (ref 31.5–35.7)
MCV: 75 fL — ABNORMAL LOW (ref 79–97)
Platelets: 238 10*3/uL (ref 150–450)
RBC: 4.17 x10E6/uL (ref 3.77–5.28)
RDW: 13.9 % (ref 11.7–15.4)
RPR Ser Ql: NONREACTIVE
Rh Factor: POSITIVE
Rubella Antibodies, IGG: 6.12 {index} (ref >0.99)
Varicella zoster IgG: REACTIVE
WBC: 9.1 10*3/uL (ref 3.4–10.8)

## 2024-04-22 ENCOUNTER — Inpatient Hospital Stay
Admission: RE | Admit: 2024-04-22 | Discharge: 2024-04-24 | DRG: 787 | Disposition: A | Discharge status: Home / Self Care | Attending: Obstetrics | Admitting: Obstetrics

## 2024-04-22 ENCOUNTER — Other Ambulatory Visit: Payer: Self-pay

## 2024-04-22 ENCOUNTER — Encounter: Payer: Self-pay | Admitting: Obstetrics

## 2024-04-22 ENCOUNTER — Inpatient Hospital Stay: Admitting: Certified Registered"

## 2024-04-22 ENCOUNTER — Encounter
Admission: RE | Disposition: A | Discharge status: Home / Self Care | Payer: Self-pay | Source: Home / Self Care | Attending: Obstetrics

## 2024-04-22 DIAGNOSIS — Z3A40 40 weeks gestation of pregnancy: Secondary | ICD-10-CM | POA: Diagnosis not present

## 2024-04-22 DIAGNOSIS — F411 Generalized anxiety disorder: Secondary | ICD-10-CM

## 2024-04-22 DIAGNOSIS — O1022 Pre-existing hypertensive chronic kidney disease complicating childbirth: Secondary | ICD-10-CM | POA: Diagnosis present

## 2024-04-22 DIAGNOSIS — O34211 Maternal care for low transverse scar from previous cesarean delivery: Principal | ICD-10-CM

## 2024-04-22 DIAGNOSIS — O99344 Other mental disorders complicating childbirth: Secondary | ICD-10-CM

## 2024-04-22 DIAGNOSIS — O9903 Anemia complicating the puerperium: Secondary | ICD-10-CM | POA: Diagnosis not present

## 2024-04-22 DIAGNOSIS — O1002 Pre-existing essential hypertension complicating childbirth: Secondary | ICD-10-CM | POA: Diagnosis not present

## 2024-04-22 DIAGNOSIS — I129 Hypertensive chronic kidney disease with stage 1 through stage 4 chronic kidney disease, or unspecified chronic kidney disease: Secondary | ICD-10-CM | POA: Diagnosis present

## 2024-04-22 DIAGNOSIS — K219 Gastro-esophageal reflux disease without esophagitis: Secondary | ICD-10-CM | POA: Diagnosis present

## 2024-04-22 DIAGNOSIS — O34219 Maternal care for unspecified type scar from previous cesarean delivery: Secondary | ICD-10-CM | POA: Diagnosis present

## 2024-04-22 DIAGNOSIS — D62 Acute posthemorrhagic anemia: Secondary | ICD-10-CM | POA: Diagnosis not present

## 2024-04-22 DIAGNOSIS — O9962 Diseases of the digestive system complicating childbirth: Secondary | ICD-10-CM | POA: Diagnosis present

## 2024-04-22 DIAGNOSIS — F909 Attention-deficit hyperactivity disorder, unspecified type: Secondary | ICD-10-CM | POA: Diagnosis present

## 2024-04-22 DIAGNOSIS — O480 Post-term pregnancy: Secondary | ICD-10-CM

## 2024-04-22 DIAGNOSIS — O0933 Supervision of pregnancy with insufficient antenatal care, third trimester: Secondary | ICD-10-CM | POA: Diagnosis not present

## 2024-04-22 DIAGNOSIS — N189 Chronic kidney disease, unspecified: Secondary | ICD-10-CM | POA: Diagnosis present

## 2024-04-22 DIAGNOSIS — Z880 Allergy status to penicillin: Secondary | ICD-10-CM | POA: Diagnosis not present

## 2024-04-22 DIAGNOSIS — O0993 Supervision of high risk pregnancy, unspecified, third trimester: Secondary | ICD-10-CM

## 2024-04-22 DIAGNOSIS — Z8249 Family history of ischemic heart disease and other diseases of the circulatory system: Secondary | ICD-10-CM

## 2024-04-22 DIAGNOSIS — O9081 Anemia of the puerperium: Secondary | ICD-10-CM | POA: Diagnosis not present

## 2024-04-22 DIAGNOSIS — Z3480 Encounter for supervision of other normal pregnancy, unspecified trimester: Principal | ICD-10-CM

## 2024-04-22 DIAGNOSIS — Z87891 Personal history of nicotine dependence: Secondary | ICD-10-CM | POA: Diagnosis not present

## 2024-04-22 DIAGNOSIS — O10919 Unspecified pre-existing hypertension complicating pregnancy, unspecified trimester: Secondary | ICD-10-CM | POA: Diagnosis present

## 2024-04-22 DIAGNOSIS — O09893 Supervision of other high risk pregnancies, third trimester: Secondary | ICD-10-CM

## 2024-04-22 LAB — URINE DRUG SCREEN, QUALITATIVE (ARMC ONLY)
Amphetamines, Ur Screen: NOT DETECTED
Barbiturates, Ur Screen: NOT DETECTED
Benzodiazepine, Ur Scrn: POSITIVE — AB
Cannabinoid 50 Ng, Ur ~~LOC~~: NOT DETECTED
Cocaine Metabolite,Ur ~~LOC~~: NOT DETECTED
MDMA (Ecstasy)Ur Screen: NOT DETECTED
Methadone Scn, Ur: NOT DETECTED
Opiate, Ur Screen: NOT DETECTED
Phencyclidine (PCP) Ur S: NOT DETECTED
Tricyclic, Ur Screen: NOT DETECTED

## 2024-04-22 LAB — CBC
HCT: 27.7 % — ABNORMAL LOW (ref 36.0–46.0)
Hemoglobin: 9 g/dL — ABNORMAL LOW (ref 12.0–15.0)
MCH: 24.3 pg — ABNORMAL LOW (ref 26.0–34.0)
MCHC: 32.5 g/dL (ref 30.0–36.0)
MCV: 74.7 fL — ABNORMAL LOW (ref 80.0–100.0)
Platelets: 234 10*3/uL (ref 150–400)
RBC: 3.71 MIL/uL — ABNORMAL LOW (ref 3.87–5.11)
RDW: 13.6 % (ref 11.5–15.5)
WBC: 8.7 10*3/uL (ref 4.0–10.5)
nRBC: 0 % (ref 0.0–0.2)

## 2024-04-22 LAB — TYPE AND SCREEN
ABO/RH(D): A POS
Antibody Screen: NEGATIVE

## 2024-04-22 SURGERY — Surgical Case
Anesthesia: Spinal

## 2024-04-22 MED ORDER — MORPHINE SULFATE (PF) 0.5 MG/ML IJ SOLN
INTRAMUSCULAR | Status: DC | PRN
Start: 2024-04-22 — End: 2024-04-22
  Administered 2024-04-22: .1 mg via INTRATHECAL

## 2024-04-22 MED ORDER — OXYCODONE HCL 5 MG PO TABS
5.0000 mg | ORAL_TABLET | Freq: Four times a day (QID) | ORAL | Status: DC | PRN
  Administered 2024-04-22 (×2): 5 mg via ORAL
  Filled 2024-04-22 (×2): qty 1

## 2024-04-22 MED ORDER — PHENYLEPHRINE HCL-NACL 20-0.9 MG/250ML-% IV SOLN
INTRAVENOUS | Status: AC
  Filled 2024-04-22: qty 250

## 2024-04-22 MED ORDER — DIPHENHYDRAMINE HCL 25 MG PO CAPS: 25.0000 mg | ORAL_CAPSULE | ORAL | Status: DC | PRN

## 2024-04-22 MED ORDER — ONDANSETRON HCL 4 MG/2ML IJ SOLN
INTRAMUSCULAR | Status: DC | PRN
  Administered 2024-04-22: 4 mg via INTRAVENOUS

## 2024-04-22 MED ORDER — SENNOSIDES-DOCUSATE SODIUM 8.6-50 MG PO TABS
2.0000 | ORAL_TABLET | Freq: Every day | ORAL | Status: DC
  Administered 2024-04-23 – 2024-04-24 (×2): 2 via ORAL
  Filled 2024-04-22 (×2): qty 2

## 2024-04-22 MED ORDER — LACTATED RINGERS IV SOLN: INTRAVENOUS | Status: DC

## 2024-04-22 MED ORDER — DIPHENHYDRAMINE HCL 50 MG/ML IJ SOLN: 12.5000 mg | INTRAMUSCULAR | Status: DC | PRN

## 2024-04-22 MED ORDER — MIDAZOLAM HCL 2 MG/2ML IJ SOLN
INTRAMUSCULAR | Status: AC
  Filled 2024-04-22: qty 2

## 2024-04-22 MED ORDER — CALCIUM CARBONATE ANTACID 500 MG PO CHEW: 2.0000 | CHEWABLE_TABLET | Freq: Three times a day (TID) | ORAL | Status: DC | PRN

## 2024-04-22 MED ORDER — MENTHOL 3 MG MT LOZG: 1.0000 | LOZENGE | OROMUCOSAL | Status: DC | PRN

## 2024-04-22 MED ORDER — OXYTOCIN-SODIUM CHLORIDE 30-0.9 UT/500ML-% IV SOLN
INTRAVENOUS | Status: DC | PRN
  Administered 2024-04-22: 30 [IU] via INTRAVENOUS

## 2024-04-22 MED ORDER — FENTANYL CITRATE (PF) 100 MCG/2ML IJ SOLN
INTRAMUSCULAR | Status: AC
  Filled 2024-04-22: qty 2

## 2024-04-22 MED ORDER — NALOXONE HCL 0.4 MG/ML IJ SOLN: 0.4000 mg | INTRAMUSCULAR | Status: DC | PRN

## 2024-04-22 MED ORDER — IBUPROFEN 600 MG PO TABS
600.0000 mg | ORAL_TABLET | Freq: Four times a day (QID) | ORAL | Status: DC
  Administered 2024-04-23 – 2024-04-24 (×4): 600 mg via ORAL
  Filled 2024-04-22 (×4): qty 1

## 2024-04-22 MED ORDER — FENTANYL CITRATE (PF) 100 MCG/2ML IJ SOLN: 25.0000 ug | INTRAMUSCULAR | Status: DC | PRN

## 2024-04-22 MED ORDER — FENTANYL CITRATE (PF) 100 MCG/2ML IJ SOLN
INTRAMUSCULAR | Status: DC | PRN
Start: 2024-04-22 — End: 2024-04-22
  Administered 2024-04-22: 50 ug via INTRAVENOUS
  Administered 2024-04-22: 35 ug via INTRAVENOUS

## 2024-04-22 MED ORDER — SOD CITRATE-CITRIC ACID 500-334 MG/5ML PO SOLN
ORAL | Status: AC
  Filled 2024-04-22: qty 15

## 2024-04-22 MED ORDER — SODIUM CHLORIDE 0.9 % IV SOLN: 12.5000 mg | INTRAVENOUS | Status: DC | PRN

## 2024-04-22 MED ORDER — GABAPENTIN 100 MG PO CAPS
200.0000 mg | ORAL_CAPSULE | Freq: Every day | ORAL | Status: DC
  Administered 2024-04-22 – 2024-04-23 (×2): 200 mg via ORAL
  Filled 2024-04-22 (×2): qty 2

## 2024-04-22 MED ORDER — SOD CITRATE-CITRIC ACID 500-334 MG/5ML PO SOLN
30.0000 mL | ORAL | Status: AC
  Administered 2024-04-22: 30 mL via ORAL

## 2024-04-22 MED ORDER — GENTAMICIN SULFATE 40 MG/ML IJ SOLN
5.0000 mg/kg | INTRAVENOUS | Status: AC
  Administered 2024-04-22: 437.6 mg via INTRAVENOUS
  Filled 2024-04-22: qty 11

## 2024-04-22 MED ORDER — FENTANYL CITRATE (PF) 100 MCG/2ML IJ SOLN
INTRAMUSCULAR | Status: DC | PRN
  Administered 2024-04-22: 15 ug via INTRATHECAL

## 2024-04-22 MED ORDER — NIFEDIPINE ER OSMOTIC RELEASE 30 MG PO TB24
30.0000 mg | ORAL_TABLET | Freq: Every day | ORAL | Status: DC
Start: 2024-04-22 — End: 2024-04-23
  Administered 2024-04-23: 30 mg via ORAL
  Filled 2024-04-22: qty 1

## 2024-04-22 MED ORDER — OXYTOCIN-SODIUM CHLORIDE 30-0.9 UT/500ML-% IV SOLN
INTRAVENOUS | Status: AC
  Filled 2024-04-22: qty 500

## 2024-04-22 MED ORDER — COCONUT OIL OIL: 1.0000 | TOPICAL_OIL | Status: DC | PRN

## 2024-04-22 MED ORDER — KETOROLAC TROMETHAMINE 30 MG/ML IJ SOLN: 30.0000 mg | Freq: Four times a day (QID) | INTRAMUSCULAR | Status: DC | PRN

## 2024-04-22 MED ORDER — SODIUM CHLORIDE 0.9% FLUSH: 3.0000 mL | INTRAVENOUS | Status: DC | PRN

## 2024-04-22 MED ORDER — SIMETHICONE 80 MG PO CHEW: 80.0000 mg | CHEWABLE_TABLET | ORAL | Status: DC | PRN

## 2024-04-22 MED ORDER — OXYCODONE HCL 5 MG PO TABS
5.0000 mg | ORAL_TABLET | ORAL | Status: DC | PRN
  Administered 2024-04-23 – 2024-04-24 (×9): 10 mg via ORAL
  Filled 2024-04-22 (×9): qty 2

## 2024-04-22 MED ORDER — SIMETHICONE 80 MG PO CHEW
80.0000 mg | CHEWABLE_TABLET | Freq: Three times a day (TID) | ORAL | Status: DC
  Administered 2024-04-22 – 2024-04-24 (×6): 80 mg via ORAL
  Filled 2024-04-22 (×6): qty 1

## 2024-04-22 MED ORDER — KETOROLAC TROMETHAMINE 30 MG/ML IJ SOLN
30.0000 mg | Freq: Four times a day (QID) | INTRAMUSCULAR | Status: AC
  Administered 2024-04-22 – 2024-04-23 (×4): 30 mg via INTRAVENOUS
  Filled 2024-04-22 (×4): qty 1

## 2024-04-22 MED ORDER — ONDANSETRON HCL 4 MG/2ML IJ SOLN: 4.0000 mg | Freq: Three times a day (TID) | INTRAMUSCULAR | Status: DC | PRN

## 2024-04-22 MED ORDER — PRENATAL MULTIVITAMIN CH
1.0000 | ORAL_TABLET | Freq: Every day | ORAL | Status: DC
  Administered 2024-04-22 – 2024-04-24 (×3): 1 via ORAL
  Filled 2024-04-22 (×3): qty 1

## 2024-04-22 MED ORDER — OXYTOCIN-SODIUM CHLORIDE 30-0.9 UT/500ML-% IV SOLN
2.5000 [IU]/h | INTRAVENOUS | Status: DC
  Administered 2024-04-22: 2.5 [IU]/h via INTRAVENOUS

## 2024-04-22 MED ORDER — DIBUCAINE (PERIANAL) 1 % EX OINT: 1.0000 | TOPICAL_OINTMENT | CUTANEOUS | Status: DC | PRN

## 2024-04-22 MED ORDER — AMPHETAMINE-DEXTROAMPHETAMINE 10 MG PO TABS
30.0000 mg | ORAL_TABLET | Freq: Every day | ORAL | Status: DC
Start: 2024-04-22 — End: 2024-04-24
  Administered 2024-04-22 – 2024-04-24 (×3): 30 mg via ORAL
  Filled 2024-04-22 (×8): qty 3

## 2024-04-22 MED ORDER — ACETAMINOPHEN 500 MG PO TABS
1000.0000 mg | ORAL_TABLET | ORAL | Status: AC
  Administered 2024-04-22: 1000 mg via ORAL
  Filled 2024-04-22: qty 2

## 2024-04-22 MED ORDER — MIDAZOLAM HCL 2 MG/2ML IJ SOLN
INTRAMUSCULAR | Status: DC | PRN
  Administered 2024-04-22: 2 mg via INTRAVENOUS

## 2024-04-22 MED ORDER — POVIDONE-IODINE 10 % EX SWAB
2.0000 | Freq: Once | CUTANEOUS | Status: AC
  Administered 2024-04-22: 2 via TOPICAL

## 2024-04-22 MED ORDER — CEFAZOLIN SODIUM-DEXTROSE 2-4 GM/100ML-% IV SOLN
INTRAVENOUS | Status: AC
Start: 2024-04-22 — End: 2024-04-22
  Filled 2024-04-22: qty 100

## 2024-04-22 MED ORDER — HYDROMORPHONE HCL 2 MG PO TABS: 2.0000 mg | ORAL_TABLET | Freq: Four times a day (QID) | ORAL | Status: DC | PRN

## 2024-04-22 MED ORDER — DIPHENHYDRAMINE HCL 25 MG PO CAPS: 25.0000 mg | ORAL_CAPSULE | Freq: Four times a day (QID) | ORAL | Status: DC | PRN

## 2024-04-22 MED ORDER — ACETAMINOPHEN 500 MG PO TABS
1000.0000 mg | ORAL_TABLET | Freq: Four times a day (QID) | ORAL | Status: DC
  Administered 2024-04-22 – 2024-04-24 (×8): 1000 mg via ORAL
  Filled 2024-04-22 (×8): qty 2

## 2024-04-22 MED ORDER — LIDOCAINE 5 % EX PTCH
MEDICATED_PATCH | CUTANEOUS | Status: AC
  Filled 2024-04-22: qty 1

## 2024-04-22 MED ORDER — MEPERIDINE HCL 25 MG/ML IJ SOLN: 6.2500 mg | INTRAMUSCULAR | Status: DC | PRN

## 2024-04-22 MED ORDER — PHENYLEPHRINE HCL-NACL 20-0.9 MG/250ML-% IV SOLN
INTRAVENOUS | Status: DC | PRN
  Administered 2024-04-22: 30 ug/min via INTRAVENOUS

## 2024-04-22 MED ORDER — NALOXONE HCL 4 MG/10ML IJ SOLN: 1.0000 ug/kg/h | INTRAVENOUS | Status: DC | PRN

## 2024-04-22 MED ORDER — WITCH HAZEL-GLYCERIN EX PADS: 1.0000 | MEDICATED_PAD | CUTANEOUS | Status: DC | PRN

## 2024-04-22 MED ORDER — KETOROLAC TROMETHAMINE 30 MG/ML IJ SOLN
INTRAMUSCULAR | Status: DC | PRN
  Administered 2024-04-22: 30 mg via INTRAVENOUS

## 2024-04-22 MED ORDER — ALPRAZOLAM 1 MG PO TABS
2.0000 mg | ORAL_TABLET | Freq: Two times a day (BID) | ORAL | Status: DC | PRN
  Administered 2024-04-22 – 2024-04-23 (×2): 2 mg via ORAL
  Filled 2024-04-22 (×2): qty 2

## 2024-04-22 MED ORDER — CLINDAMYCIN PHOSPHATE 900 MG/50ML IV SOLN
900.0000 mg | INTRAVENOUS | Status: AC
  Administered 2024-04-22: 900 mg via INTRAVENOUS
  Filled 2024-04-22: qty 50

## 2024-04-22 MED ORDER — ONDANSETRON HCL 4 MG/2ML IJ SOLN
INTRAMUSCULAR | Status: AC
  Filled 2024-04-22: qty 2

## 2024-04-22 MED ORDER — MORPHINE SULFATE (PF) 0.5 MG/ML IJ SOLN
INTRAMUSCULAR | Status: DC | PRN
  Administered 2024-04-22 (×2): 2 mg via INTRAVENOUS
  Administered 2024-04-22: .9 mg via INTRAVENOUS

## 2024-04-22 MED ORDER — LIDOCAINE 5 % EX PTCH
MEDICATED_PATCH | CUTANEOUS | Status: DC | PRN
  Administered 2024-04-22: 1 via TRANSDERMAL

## 2024-04-22 MED ORDER — MORPHINE SULFATE (PF) 0.5 MG/ML IJ SOLN
INTRAMUSCULAR | Status: AC
  Filled 2024-04-22: qty 10

## 2024-04-22 MED ORDER — BUPIVACAINE IN DEXTROSE 0.75-8.25 % IT SOLN
INTRATHECAL | Status: DC | PRN
  Administered 2024-04-22: 1.5 mL via INTRATHECAL

## 2024-04-22 SURGICAL SUPPLY — 27 items
BAG COUNTER SPONGE SURGICOUNT (BAG) ×1 IMPLANT
BENZOIN TINCTURE PRP APPL 2/3 (GAUZE/BANDAGES/DRESSINGS) IMPLANT
CHLORAPREP W/TINT 26 (MISCELLANEOUS) ×2 IMPLANT
DRSG OPSITE POSTOP 4X12 (GAUZE/BANDAGES/DRESSINGS) IMPLANT
DRSG TELFA 3X8 NADH STRL (GAUZE/BANDAGES/DRESSINGS) ×1 IMPLANT
ELECTRODE REM PT RTRN 9FT ADLT (ELECTROSURGICAL) ×1 IMPLANT
GAUZE PAD ABD 8X10 STRL (GAUZE/BANDAGES/DRESSINGS) IMPLANT
GAUZE SPONGE 4X4 12PLY STRL (GAUZE/BANDAGES/DRESSINGS) ×1 IMPLANT
GLOVE BIO SURGEON STRL SZ 6 (GLOVE) ×1 IMPLANT
GLOVE BIOGEL PI IND STRL 6 (GLOVE) ×1 IMPLANT
GOWN STRL REUS W/ TWL LRG LVL3 (GOWN DISPOSABLE) ×2 IMPLANT
KIT TURNOVER KIT A (KITS) ×1 IMPLANT
MANIFOLD NEPTUNE II (INSTRUMENTS) ×1 IMPLANT
MAT PREVALON FULL STRYKER (MISCELLANEOUS) ×1 IMPLANT
NS IRRIG 1000ML POUR BTL (IV SOLUTION) ×1 IMPLANT
PACK C SECTION AR (MISCELLANEOUS) ×1 IMPLANT
PAD OB MATERNITY 11 LF (PERSONAL CARE ITEMS) ×1 IMPLANT
PAD PREP OB/GYN DISP 24X41 (PERSONAL CARE ITEMS) ×1 IMPLANT
SCRUB CHG 4% DYNA-HEX 4OZ (MISCELLANEOUS) ×1 IMPLANT
STRIP CLOSURE SKIN 1/2X4 (GAUZE/BANDAGES/DRESSINGS) IMPLANT
SUT MNCRL AB 4-0 PS2 18 (SUTURE) ×1 IMPLANT
SUT MON AB 3-0 SH 27 (SUTURE) IMPLANT
SUT VIC AB 0 CT1 36 (SUTURE) ×4 IMPLANT
SUT VIC AB 3-0 SH 27X BRD (SUTURE) ×1 IMPLANT
TAPE TRANSPORE STRL 2 31045 (GAUZE/BANDAGES/DRESSINGS) IMPLANT
TRAP FLUID SMOKE EVACUATOR (MISCELLANEOUS) ×1 IMPLANT
WATER STERILE IRR 500ML POUR (IV SOLUTION) ×1 IMPLANT

## 2024-04-22 NOTE — Anesthesia Procedure Notes (Signed)
 Spinal  Patient location during procedure: OR Start time: 04/22/2024 8:53 AM End time: 04/22/2024 8:57 AM Reason for block: surgical anesthesia Staffing Performed: resident/CRNA  Anesthesiologist: Vanice Genre, MD Resident/CRNA: Orin Birk, CRNA Performed by: Orin Birk, CRNA Authorized by: Morna Arabian, MD   Preanesthetic Checklist Completed: patient identified, IV checked, site marked, risks and benefits discussed, surgical consent, monitors and equipment checked, pre-op evaluation and timeout performed Spinal Block Patient position: sitting Prep: ChloraPrep Patient monitoring: heart rate, continuous pulse ox, blood pressure and cardiac monitor Approach: midline Location: L3-4 Injection technique: single-shot Needle Needle type: Introducer and Pencan  Needle gauge: 24 G Needle length: 9 cm Assessment Sensory level: T10 Events: CSF return Additional Notes Sterile aseptic technique used throughout the procedure.  Negative paresthesia. Negative blood return. Positive free-flowing CSF. Expiration date of kit checked and confirmed. Patient tolerated procedure well, without complications.

## 2024-04-22 NOTE — Anesthesia Preprocedure Evaluation (Signed)
 Anesthesia Evaluation  Patient identified by MRN, date of birth, ID band Patient awake    Reviewed: Allergy & Precautions, NPO status , Patient's Chart, lab work & pertinent test results  History of Anesthesia Complications Negative for: history of anesthetic complications  Airway Mallampati: II  TM Distance: >3 FB Neck ROM: full    Dental  (+) Chipped, Dental Advidsory Given   Pulmonary neg pulmonary ROS, neg shortness of breath, neg recent URI, former smoker   Pulmonary exam normal        Cardiovascular Exercise Tolerance: Good hypertension, (-) angina (-) Past MI Normal cardiovascular exam(-) dysrhythmias (-) Valvular Problems/Murmurs     Neuro/Psych neg Headaches PSYCHIATRIC DISORDERS Anxiety        GI/Hepatic Neg liver ROS,GERD  Controlled,,  Endo/Other  negative endocrine ROS    Renal/GU Renal disease     Musculoskeletal   Abdominal   Peds  Hematology  (+) Blood dyscrasia, anemia   Anesthesia Other Findings Past Medical History: No date: Acute gastritis without hemorrhage No date: ADHD No date: Anemia No date: Anxiety No date: Chronic kidney disease     Comment:  kidney reflux, problem more as child/ recuurent               interstitial cystitis No date: Essential hypertension No date: Family history of breast cancer     Comment:  7/21 cancer genetic testing letter sent No date: GERD (gastroesophageal reflux disease) No date: Headache     Comment:  daily No date: IC (interstitial cystitis) No date: Infection     Comment:  UTI 03/29/2023: Labor and delivery, indication for care No date: Thyroid dysfunction No date: Thyroid nodule No date: Tonsillitis  Past Surgical History: 2016: BREAST ENHANCEMENT SURGERY; Bilateral 2012: CESAREAN SECTION 07/07/2018: ESOPHAGOGASTRODUODENOSCOPY (EGD) WITH PROPOFOL ; N/A     Comment:  Procedure: ESOPHAGOGASTRODUODENOSCOPY (EGD) WITH               PROPOFOL ;   Surgeon: Marnee Sink, MD;  Location: [facility];  Service: Endoscopy;  Laterality: N/A; 01/29/2018: TONSILLECTOMY; N/A     Comment:  Procedure: TONSILLECTOMY;  Surgeon: Rogers Clayman,               MD;  Location: [facility];  Service: ENT;                Laterality: N/A;  UPREG  BMI    Body Mass Index: 32.95 kg/m      Reproductive/Obstetrics negative OB ROS                             Anesthesia Physical Anesthesia Plan  ASA: 3  Anesthesia Plan: Spinal   Post-op Pain Management:    Induction:   PONV Risk Score and Plan:   Airway Management Planned: Natural Airway and Nasal Cannula  Additional Equipment:   Intra-op Plan:   Post-operative Plan:   Informed Consent: I have reviewed the patients History and Physical, chart, labs and discussed the procedure including the risks, benefits and alternatives for the proposed anesthesia with the patient or authorized representative who has indicated his/her understanding and acceptance.     Dental Advisory Given  Plan Discussed with: Anesthesiologist, CRNA and Surgeon  Anesthesia Plan Comments: (Patient reports no bleeding problems and no anticoagulant use.  Plan for spinal with backup GA  Patient consented for risks of anesthesia  including but not limited to:  - adverse reactions to medications - damage to eyes, teeth, lips or other oral mucosa - nerve damage due to positioning  - risk of bleeding, infection and or nerve damage from spinal that could lead to paralysis - risk of headache or failed spinal - damage to teeth, lips or other oral mucosa - sore throat or hoarseness - damage to heart, brain, nerves, lungs, other parts of body or loss of life  Patient voiced understanding.)       Anesthesia Quick Evaluation

## 2024-04-22 NOTE — Op Note (Signed)
 CESAREAN SECTION OPERATIVE REPORT   DATE OF SURGERY: 04/22/24  SURGEON: Sofia Dunn, DO ASSISTANT:  Aureliano Blow ANESTHESIA: Spinal   PROCEDURE: Repeat low transverse cesarean section  PREOPERATIVE DIAGNOSES: 1. Intrauterine pregnancy at [redacted]w[redacted]d 2. History of previous cesarean section x 2 3. No prenatal care 4. Chronic hypertension 5. 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 Same, s/p rLTCS  QBL: 650cc DRAINS: foley catheter to gravity drainage, 175 ml of clear urine at end of the procedure TOTAL IV FLUIDS: SPECIMENS: Cord segment COMPLICATIONS:  None  FINDINGS:  Viable female infant in cephalic presentation; APGARs 9/9; weight 3220 grams (7lbs, 1.6oz) Clear fluid at amniotomy Intact placenta with 3 vessel cord Uterus, tubes, and ovaries appeared normal  INDICATION and CONSENT: Lydia Kennedy is a 33 y.o. [id number] with IUP at [redacted]w[redacted]d presenting for scheduled repeat cesarean for history of prior x 2. The patient understood that the risks of cesarean section include, but are not limited to, visceral or vascular injury, infection, blood loss and need for transfusion, prolonged hospitalization, and reoperation.  The patient stated understanding and desired to proceed.  All questions were answered.  PROCEDURE:  After verbal and written informed consent was obtained, the patient was taken to the operating room where spinal anesthesia was found to be adequate.  SCDs were applied to the lower extremities and a Foley catheter was placed in the bladder under sterile technique.  The patient was placed in dorsal supine position with a leftward tilt, prepped and draped in a sterile fashion.  Clindamycin  900mg  and Gentamicin  440mg  were given for infection prophylaxis.  Level of anesthesia was confirmed to be adequate with Allis clamps.  A Pfannenstiel skin incision was made with the scalpel and carried down to the underlying layer of rectus fascia.  The fascia was nicked  bilaterally in the midline with the scalpel and the fascial incision was extended laterally using the Bovie and Mayo scissors.  The superior aspect of the fascia was grasped with Kocher clamps and the underlying rectus muscles were dissected off sharply with the Bovie and bluntly.  In a similar fashion, the inferior aspect of fascia was grasped with Kocher clamps and the underlying rectus and pyramidalis were dissected off sharply and bluntly.  The rectus muscles were separated in the midline sharply.  The peritoneum was found to be free of adherent bowel and entered bluntly.  The peritoneal incision was extended bluntly to the bladder reflection with good visualization of the bladder.  The Alexis retractor as inserted. Intraabdomnial survey revealed scant, clear peritoneal fluid and a thinned-out lower uterine segment.  The vesicouterine peritoneum was opened with Metzenbaum scissors and the bladder flap was developed.  The Trula Gable was repositioned to keep the bladder out of the operative field.  The lower uterine segment was incised transversely with the scalpel.  The amniotic sac was ruptured with an Allis clamp and clear fluid noted.  The uterine incision was extended bluntly in a cranial-caudal fashion.  The fetus was in cephalic presentation.  The head was flexed and elevated to the level of the uterine incision.  Gentle fundal pressure was applied by the assistant and the infant's head was delivered without difficulty.  There was a loose nuchal cord x 1, easily reduced, then followed by the rest of body easily. The nose and mouth were suctioned with a bulb. The cord was doubly clamped and cut, with a cord segment obtained.  The infant was handed to the awaiting NICU team.  The  placenta delivered intact & spontaneously with manual massage of the uterine fundus. The uterus was left in situ.  The inside of the uterus was gently wiped with lap sponges x 2 ensuring complete removal of placental membranes.  The  uterine incision was repaired with a double layer closure of 0-Vicryl first in a locking fashion, followed by 0-Vicryl in an imbricating stitch. Two additional figure of eights were placed in the right corner of the hysterotomy, with excellent hemostasis achieved.  The ovaries and tubes were found to be grossly normal.  Blood clots, debris and fluid were cleaned from the abdomen, gutters, and pelvis with moist laparotomy sponges.  The uterine incision was reinspected and was hemostatic.  The superior and inferior fascia were grasped with Kocher clamps and the rectus muscles were examined and found to be hemostatic, ensured with Bovie electrocautery.  The peritoneum was closed with 3-0 Monocryl in a continuous, running fashion, and the rectus muscles were not reapproximated with suture.  The fascial layer was closed with 0-Vicryl in a running fashion.  The subcutaneous tissue was irrigated, made hemostatic with Bovie electrocautery, then reapproximated with a running layer of 3-0 Plain. The skin was closed subcuticularly with 4-0 Vicryl on a keith and steristrips and a sterile pressure dressing  was placed.  All sponge, lap and instrument counts were correct x 2. The patient tolerated the procedure well and was taken to the recovery room in stable condition.  An experienced assistant was required given the standard of surgical care given the complexity of the case.  This assistant was needed for exposure, dissection, suctioning, retraction, instrument exchange, and for overall help during the procedure.   Sofia Dunn, DO [facility]

## 2024-04-22 NOTE — H&P (Signed)
 Obstetric Preoperative History and Physical  Lydia Kennedy is a 33 y.o. [id number] with IUP at [redacted]w[redacted]d presenting for scheduled cesarean section.  Reports good fetal movement, no bleeding, no contractions, no leaking of fluid.  Her pregnancy is notable for chronic hyptertension, taking Nifedipine , no prenatal care, short interpregnancy interval, anxiety taking Xanax , ADHD taking Adderall, and prior cesarean x 2 desiring repeat.   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 04/20/2024   History of cesarean delivery x 2, currently pregnant 04/20/2024   Short interval between pregnancies complicating pregnancy in third trimester, antepartum 04/20/2024   Supervision of other normal pregnancy, antepartum 12/05/2023   HTN in pregnancy, chronic 03/06/2023   History of kidney problems 11/23/2022   Essential hypertension 10/24/2021   ADHD 10/24/2021   Thyroid nodule 09/08/2021   Other somatoform disorders    Gastroesophageal reflux disease without esophagitis    Acute gastritis without hemorrhage    Generalized anxiety disorder 05/04/2016   She plans to bottle feed She desires  IUD  for postpartum contraception, declines postplacental at time of cesarean.   Prenatal labs and studies: ABO, Rh: A POS (04/28 1520) Antibody: NEGATIVE (04/28 1520) Rubella: 6.12 (04/28 1520) RPR: Non Reactive (04/28 1520)  HBsAg: Negative (04/28 1520)  HIV: Non Reactive (04/28 1520)  YQI:HKVQQVZDGLO NEGATIVE/-- (03/13 1707) 1 hr Glucola: no prior this pregnancy Genetic screening  not done Anatomy US  abnormal with pyelectasis and triangular anechoic area in falx, confirmed by MFM to be normal  variant  Past Medical History:  Diagnosis Date   Acute gastritis without hemorrhage    ADHD    Anemia    Anxiety    Chronic kidney disease    kidney reflux, problem more as child/ recuurent interstitial cystitis   Essential hypertension    Family history of breast cancer    7/21 cancer genetic testing letter sent   GERD (gastroesophageal reflux disease)    Headache    daily   History of cesarean delivery affecting pregnancy 11/04/2022   IC (interstitial cystitis)    Infection    UTI   Labor and delivery, indication for care 03/29/2023   Maternal chronic hypertension, third trimester 05/01/2023   Pregnancy 05/13/2023   Supervision of high-risk pregnancy 10/12/2022              Clinical Staff    Provider      Office Location     [facility]    Dating     Not found.      Language     english    Anatomy US             Flu Vaccine     Declined    Genetic Screen     NIPS: Negative/Female: 10/19/22      TDaP vaccine      Declined       Hgb A1C or   GTT    Early :  Third trimester : 121 (03/06/2023)      Covid    Declined          LAB RESULTS       Rhogam     A/P   Thyroid dysfunction  Thyroid nodule    Tonsillitis     Past Surgical History:  Procedure Laterality Date   BREAST ENHANCEMENT SURGERY Bilateral 2016   CESAREAN SECTION  2012   CESAREAN SECTION N/A 05/13/2023   Procedure: REPEAT CESAREAN SECTION;  Surgeon: Zenobia Hila, MD;  Location: [facility];  Service: Obstetrics;  Laterality: N/A;   ESOPHAGOGASTRODUODENOSCOPY (EGD) WITH PROPOFOL  N/A 07/07/2018   Procedure: ESOPHAGOGASTRODUODENOSCOPY (EGD) WITH PROPOFOL ;  Surgeon: Marnee Sink, MD;  Location: [facility];  Service: Endoscopy;  Laterality: N/A;   TONSILLECTOMY N/A 01/29/2018   Procedure: TONSILLECTOMY;  Surgeon: Rogers Clayman, MD;  Location: [facility];  Service: ENT;  Laterality: N/A;  UPREG   WISDOM TOOTH EXTRACTION     four; 2018    OB History  Gravida Para Term Preterm AB Living  3  2 2   2   SAB IAB Ectopic Multiple Live Births     0 2    # Outcome Date GA Lbr Len/2nd Weight Sex Type Anes PTL Lv  3 Current           2 Term 05/13/23 [redacted]w[redacted]d  3150 g F CS-LTranv Spinal  LIV  1 Term 07/12/11 [redacted]w[redacted]d  3600 g F CS-LTranv   LIV     Complications: Failure to Progress in Second Stage    Social History   Socioeconomic History   Marital status: Married    Spouse name: Merchant navy officer   Number of children: 2   Years of education: 14   Highest education level: Associate degree: academic program  Occupational History   Occupation: Investment banker, corporate    Comment: [employer]  Tobacco Use   Smoking status: Former    Current packs/day: 0.00    Types: Cigarettes    Quit date: 05/13/2023    Years since quitting: 0.9   Smokeless tobacco: Never  Vaping Use   Vaping status: Some Days   Substances: Flavoring  Substance and Sexual Activity   Alcohol use: Not Currently    Alcohol/week: 3.0 standard drinks of alcohol    Types: 3 Standard drinks or equivalent per week    Comment: occ   Drug use: No   Sexual activity: Yes    Partners: Male    Birth control/protection: None  Other Topics Concern   Not on file  Social History Narrative   Not on file   Social Drivers of Health   Financial Resource Strain: Low Risk  (12/05/2023)   Overall Financial Resource Strain (CARDIA)    Difficulty of Paying Living Expenses: Not very hard  Food Insecurity: No Food Insecurity (04/22/2024)   Hunger Vital Sign    Worried About Running Out of Food in the Last Year: Never true    Ran Out of Food in the Last Year: Never true  Transportation Needs: No Transportation Needs (04/22/2024)   PRAPARE - Administrator, Civil Service (Medical): No    Lack of Transportation (Non-Medical): No  Physical Activity: Insufficiently Active (12/05/2023)   Exercise Vital Sign    Days of Exercise per Week: 2 days    Minutes of Exercise per Session: 30 min  Stress: No Stress Concern Present  (12/05/2023)   [employer] of Occupational Health - Occupational Stress Questionnaire    Feeling of Stress : Not at all  Social Connections: Moderately Integrated (04/22/2024)   Social Connection and Isolation Panel [NHANES]    Frequency of Communication with Friends and Family: More than three times a week    Frequency of  Social Gatherings with Friends and Family: Twice a week    Attends Religious Services: More than 4 times per year    Active Member of [employer] or Organizations: No    Attends Engineer, structural: Never    Marital Status: Married    Family History  Problem Relation Age of Onset   Breast cancer Mother 26   Hypertension Father    Healthy Brother    Pancreatic cancer Maternal Grandmother 35   Healthy Maternal Grandfather    Healthy Paternal Grandmother    Heart attack Paternal Grandfather     Medications Prior to Admission  Medication Sig Dispense Refill Last Dose/Taking   acetaminophen  (TYLENOL ) 325 MG tablet Take 2 tablets (650 mg total) by mouth every 4 (four) hours as needed for mild pain (temperature > 101.5.).   Taking As Needed   alprazolam  (XANAX ) 2 MG tablet Take 2 mg by mouth 2 (two) times daily.   Taking   amphetamine -dextroamphetamine (ADDERALL) 30 MG tablet Take 30 mg by mouth daily.   Taking   NIFEdipine  (PROCARDIA -XL/NIFEDICAL-XL) 30 MG 24 hr tablet Take 1 tablet (30 mg total) by mouth at bedtime. Can increase to twice a day as needed for symptomatic contractions 30 tablet 6 Taking    Allergies  Allergen Reactions   Cephalexin  Anaphylaxis   Elemental Sulfur Shortness Of Breath    Other reaction(s): Muscle Pain   Penicillins Anaphylaxis and Shortness Of Breath    Throat swelling   Sulfa  Antibiotics Anaphylaxis   Chlorhexidine  Gluconate Rash    Review of Systems: Pertinent items noted in HPI and remainder of comprehensive ROS otherwise negative.  Physical Exam: BP 123/76   Pulse 88   LMP  (LMP Unknown)  FHR by Doppler: 130s  bpm CONSTITUTIONAL: Well-developed, well-nourished female in no acute distress.  HENT:  Normocephalic, atraumatic, External right and left ear normal. Oropharynx is clear and moist EYES: Conjunctivae and EOM are normal. Pupils are equal, round, and reactive to light. No scleral icterus.  NECK: Normal range of motion, supple, no masses SKIN: Skin is warm and dry. No rash noted. Not diaphoretic. No erythema. No pallor. NEUROLOGIC: Alert and oriented to person, place, and time. Normal reflexes, muscle tone coordination. No cranial nerve deficit noted. PSYCHIATRIC: Normal mood and affect. Normal behavior. Normal judgment and thought content. CARDIOVASCULAR: Normal heart rate noted, regular rhythm RESPIRATORY: Effort and breath sounds normal, no problems with respiration noted ABDOMEN: Soft, nontender, nondistended, gravid. Well-healed Pfannenstiel incision. PELVIC: Deferred MUSCULOSKELETAL: Normal range of motion. No edema and no tenderness. 2+ distal pulses.  Pertinent Labs/Studies:   Results for orders placed or performed during the hospital encounter of 04/22/24 (from the past 72 hours)  Type and screen [facility]     Status: None (Preliminary result)   Collection Time: 04/22/24  6:45 AM  Result Value Ref Range   ABO/RH(D) PENDING    Antibody Screen PENDING    Sample Expiration      04/25/2024,2359 Performed at [facility], [address] [place], [place] [address]   CBC     Status: Abnormal   Collection Time: 04/22/24  6:45 AM  Result Value Ref Range   WBC 8.7 4.0 - 10.5 K/uL   RBC 3.71 (L) 3.87 - 5.11 MIL/uL   Hemoglobin 9.0 (L) 12.0 - 15.0 g/dL   HCT 60.4 (L) 54.0 - 98.1 %   MCV 74.7 (L) 80.0 - 100.0 fL   MCH 24.3 (L) 26.0 - 34.0 pg  MCHC 32.5 30.0 - 36.0 g/dL   RDW 40.9 81.1 - 91.4 %   Platelets 234 150 - 400 K/uL   nRBC 0.0 0.0 - 0.2 %    Comment: Performed at [facility], [address] [place], [place] [address]     Assessment and Plan: JOANETTE HOELZEL is a 33 y.o. [id number] at [redacted]w[redacted]d being admitted for scheduled cesarean section for hx of prior c-section x 2. The risks of surgery were discussed with the patient including but were not limited to: bleeding which may require transfusion or reoperation; infection which may require antibiotics; injury to bowel, bladder, ureters or other surrounding organs; injury to the fetus; need for additional procedures including hysterectomy in the event of a life-threatening hemorrhage; formation of adhesions; placental abnormalities wth subsequent pregnancies; incisional problems; thromboembolic phenomenon and other postoperative/anesthesia complications. The patient concurred with the proposed plan, giving informed written consent for the procedure. Patient has been NPO since last night she will remain NPO for procedure. Anesthesia and OR aware. Preoperative prophylactic antibiotics and SCDs ordered on call to the OR. To OR when ready.   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

## 2024-04-22 NOTE — Transfer of Care (Signed)
 Immediate Anesthesia Transfer of Care Note  Patient: Lydia Kennedy  Procedure(s) Performed: CESAREAN DELIVERY  Patient Location: PACU and Mother/Baby  Anesthesia Type:Spinal  Level of Consciousness: awake, alert , and oriented  Airway & Oxygen Therapy: Patient Spontanous Breathing  Post-op Assessment: Report given to RN and Post -op Vital signs reviewed and stable  Post vital signs: Reviewed and stable  Last Vitals:  Vitals Value Taken Time  BP 125/84 (97) 1021 04/22/2024  Temp    Pulse 66   Resp 18   SpO2 99     Last Pain:  Vitals:   04/22/24 0845  TempSrc:   PainSc: 0-No pain         Complications: No notable events documented.

## 2024-04-23 ENCOUNTER — Encounter: Payer: Self-pay | Admitting: Obstetrics

## 2024-04-23 DIAGNOSIS — O480 Post-term pregnancy: Secondary | ICD-10-CM | POA: Diagnosis not present

## 2024-04-23 DIAGNOSIS — O34211 Maternal care for low transverse scar from previous cesarean delivery: Secondary | ICD-10-CM | POA: Diagnosis not present

## 2024-04-23 DIAGNOSIS — O0933 Supervision of pregnancy with insufficient antenatal care, third trimester: Secondary | ICD-10-CM | POA: Diagnosis not present

## 2024-04-23 DIAGNOSIS — O1002 Pre-existing essential hypertension complicating childbirth: Secondary | ICD-10-CM | POA: Diagnosis not present

## 2024-04-23 DIAGNOSIS — D62 Acute posthemorrhagic anemia: Secondary | ICD-10-CM | POA: Insufficient documentation

## 2024-04-23 LAB — CBC
HCT: 24 % — ABNORMAL LOW (ref 36.0–46.0)
Hemoglobin: 7.7 g/dL — ABNORMAL LOW (ref 12.0–15.0)
MCH: 24.2 pg — ABNORMAL LOW (ref 26.0–34.0)
MCHC: 32.1 g/dL (ref 30.0–36.0)
MCV: 75.5 fL — ABNORMAL LOW (ref 80.0–100.0)
Platelets: 189 10*3/uL (ref 150–400)
RBC: 3.18 MIL/uL — ABNORMAL LOW (ref 3.87–5.11)
RDW: 13.8 % (ref 11.5–15.5)
WBC: 9.2 10*3/uL (ref 4.0–10.5)
nRBC: 0 % (ref 0.0–0.2)

## 2024-04-23 MED ORDER — LIDOCAINE 5 % EX PTCH
1.0000 | MEDICATED_PATCH | CUTANEOUS | Status: DC
  Administered 2024-04-23 – 2024-04-24 (×2): 1 via TRANSDERMAL
  Filled 2024-04-23 (×2): qty 1

## 2024-04-23 MED ORDER — EPINEPHRINE 0.3 MG/0.3ML IJ SOAJ: 0.3000 mg | Freq: Once | INTRAMUSCULAR | Status: DC | PRN

## 2024-04-23 MED ORDER — ALBUTEROL SULFATE (2.5 MG/3ML) 0.083% IN NEBU: 2.5000 mg | INHALATION_SOLUTION | Freq: Once | RESPIRATORY_TRACT | Status: DC | PRN

## 2024-04-23 MED ORDER — SODIUM CHLORIDE 0.9 % IV BOLUS: 500.0000 mL | Freq: Once | INTRAVENOUS | Status: DC | PRN

## 2024-04-23 MED ORDER — DIPHENHYDRAMINE HCL 50 MG/ML IJ SOLN: 25.0000 mg | Freq: Once | INTRAMUSCULAR | Status: DC | PRN

## 2024-04-23 MED ORDER — NIFEDIPINE ER OSMOTIC RELEASE 30 MG PO TB24
30.0000 mg | ORAL_TABLET | Freq: Every day | ORAL | Status: DC
  Administered 2024-04-24: 30 mg via ORAL
  Filled 2024-04-23: qty 1

## 2024-04-23 MED ORDER — METHYLPREDNISOLONE SODIUM SUCC 125 MG IJ SOLR: 125.0000 mg | Freq: Once | INTRAMUSCULAR | Status: DC | PRN

## 2024-04-23 MED ORDER — SODIUM CHLORIDE 0.9 % IV SOLN: INTRAVENOUS | Status: AC | PRN

## 2024-04-23 MED ORDER — IRON SUCROSE 500 MG IVPB - SIMPLE MED
500.0000 mg | Freq: Once | INTRAVENOUS | Status: AC
  Administered 2024-04-23: 500 mg via INTRAVENOUS
  Filled 2024-04-23: qty 500

## 2024-04-23 NOTE — Progress Notes (Signed)
 Subjective: Postpartum Day 1: Cesarean Delivery Patient reports incisional pain, tolerating PO, + flatus, and no problems voiding.    Objective: Vital signs in last 24 hours: Temp:  [97.6 F (36.4 C)-98.4 F (36.9 C)] 97.7 F (36.5 C) (05/01 0746) Pulse Rate:  [63-102] 73 (05/01 0746) Resp:  [11-20] 18 (05/01 0746) BP: (105-132)/(55-85) 118/76 (05/01 0746) SpO2:  [95 %-100 %] 99 % (05/01 0746)  Physical Exam:  General: alert, cooperative, appears stated age, and no distress Lochia: appropriate Uterine Fundus: firm Incision: pressure dressing in place, no drainage noted DVT Evaluation: No evidence of DVT seen on physical exam.  Recent Labs    04/22/24 0645 04/23/24 0601  HGB 9.0* 7.7*  HCT 27.7* 24.0*    Assessment/Plan: Status post Cesarean section. Doing well postoperatively.  Acute blood loss anemia, IV Venofer  ordered Continue current care.  Forestine Igo, CNM 04/23/2024, 9:01 AM

## 2024-04-23 NOTE — Anesthesia Post-op Follow-up Note (Signed)
  Anesthesia Pain Follow-up Note  Patient: Lydia Kennedy  Day #: 1  Date of Follow-up: 04/23/2024 Time: 9:28 AM  Last Vitals:  Vitals:   04/23/24 0358 04/23/24 0746  BP: 114/68 118/76  Pulse: 72 73  Resp: 20 18  Temp: 36.7 C 36.5 C  SpO2: 99% 99%    Level of Consciousness: alert  Pain: mild   Side Effects:None  Catheter Site Exam:clean, dry, no drainage     Plan: D/C from anesthesia care at surgeon's request  Portia Brittle Kenika Sahm

## 2024-04-23 NOTE — Anesthesia Postprocedure Evaluation (Signed)
 Anesthesia Post Note  Patient: Lydia Kennedy  Procedure(s) Performed: CESAREAN DELIVERY  Patient location during evaluation: Mother Baby Anesthesia Type: Spinal Level of consciousness: oriented and awake and alert Pain management: pain level controlled Vital Signs Assessment: post-procedure vital signs reviewed and stable Respiratory status: spontaneous breathing Cardiovascular status: blood pressure returned to baseline and stable Postop Assessment: no headache, no backache, no apparent nausea or vomiting, patient able to bend at knees and able to ambulate Anesthetic complications: no   No notable events documented.   Last Vitals:  Vitals:   04/23/24 0358 04/23/24 0746  BP: 114/68 118/76  Pulse: 72 73  Resp: 20 18  Temp: 36.7 C 36.5 C  SpO2: 99% 99%    Last Pain:  Vitals:   04/23/24 0911  TempSrc:   PainSc: 8                  Portia Brittle Ruth Kovich

## 2024-04-24 DIAGNOSIS — O0933 Supervision of pregnancy with insufficient antenatal care, third trimester: Secondary | ICD-10-CM | POA: Diagnosis not present

## 2024-04-24 DIAGNOSIS — O1002 Pre-existing essential hypertension complicating childbirth: Secondary | ICD-10-CM | POA: Diagnosis not present

## 2024-04-24 DIAGNOSIS — O480 Post-term pregnancy: Secondary | ICD-10-CM | POA: Diagnosis not present

## 2024-04-24 DIAGNOSIS — O34211 Maternal care for low transverse scar from previous cesarean delivery: Secondary | ICD-10-CM | POA: Diagnosis not present

## 2024-04-24 MED ORDER — IBUPROFEN 600 MG PO TABS: 600.0000 mg | ORAL_TABLET | Freq: Four times a day (QID) | ORAL | 0 refills | Status: DC

## 2024-04-24 MED ORDER — NORETHINDRONE 0.35 MG PO TABS
1.0000 | ORAL_TABLET | Freq: Every day | ORAL | 3 refills | Status: AC
Start: 2024-04-24 — End: 2025-04-24

## 2024-04-24 MED ORDER — FERROUS SULFATE 325 (65 FE) MG PO TBEC: 325.0000 mg | DELAYED_RELEASE_TABLET | Freq: Every day | ORAL | 3 refills | Status: DC

## 2024-04-24 MED ORDER — OXYCODONE-ACETAMINOPHEN 5-325 MG PO TABS: 1.0000 | ORAL_TABLET | ORAL | 0 refills | Status: DC | PRN

## 2024-04-24 MED ORDER — TETANUS-DIPHTH-ACELL PERTUSSIS 5-2.5-18.5 LF-MCG/0.5 IM SUSY: 0.5000 mL | PREFILLED_SYRINGE | Freq: Once | INTRAMUSCULAR | Status: DC

## 2024-04-24 NOTE — Final Progress Note (Signed)
 Subjective: Postpartum Day 2: Cesarean Delivery Lydia Kennedy is feeling well overall. She is ambulating, voiding, and tolerating POs without difficulty. Her pain is well-controlled and her bleeding is WNL. She is having some back pain. Her mood is stable. She is bottle feeding the baby. She is normotensive.  Objective: Vital signs in last 24 hours: Temp:  [97.5 F (36.4 C)-98.1 F (36.7 C)] 97.5 F (36.4 C) (05/02 0827) Pulse Rate:  [71-100] 71 (05/02 0827) Resp:  [17-18] 17 (05/02 0827) BP: (98-149)/(65-95) 111/72 (05/02 0827) SpO2:  [97 %-99 %] 97 % (05/02 0827)  Physical Exam:  General: alert and cooperative Lochia: appropriate Uterine Fundus: firm Incision: healing well, dressing C/D/I DVT Evaluation: No evidence of DVT seen on physical exam.  Recent Labs    04/22/24 0645 04/23/24 0601  HGB 9.0* 7.7*  HCT 27.7* 24.0*    Assessment/Plan: Status post Cesarean section. Doing well postoperatively.  Plan on discharge after TOC visit Continue nifedipine  daily PO iron  POPs ordered Incision/BP check in one week. Office visit in 6 weeks  Phylliss Brenner, CNM 04/24/2024, 11:06 AM

## 2024-04-24 NOTE — Discharge Summary (Signed)
 Postpartum Discharge Summary  Date of Service updated 04/24/24      Patient Name: Lydia Kennedy DOB: [birth year] MRN: [id number]  Date of admission: 04/22/2024 Delivery date:04/22/2024 Delivering provider: Sofia Dunn Date of discharge: 04/24/2024  Admitting diagnosis: [redacted] weeks gestation of pregnancy [Z3A.40] Previous cesarean delivery affecting pregnancy, antepartum [O34.219] Intrauterine pregnancy: [redacted]w[redacted]d     Secondary diagnosis:  Principal Problem:   Previous cesarean delivery affecting pregnancy, antepartum Active Problems:   Generalized anxiety disorder   HTN in pregnancy, chronic   ADHD   No prenatal care in current pregnancy in third trimester   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 yo G3P3003 at [redacted]w[redacted]d was admitted to the hospital 04/22/2024 for scheduled cesarean section with the following indication:Elective Repeat.Delivery details are as follows:  Membrane Rupture Time/Date: 9:25 AM,04/22/2024  Delivery Method:C-Section, Low Transverse Operative Delivery:N/A Details of operation can be found in separate operative note.  Patient had an uncomplicated postpartum course.  She is ambulating, tolerating a regular diet, passing flatus, and urinating well. Patient is discharged home in stable condition on  04/24/24        Newborn Data: Birth date:04/22/2024 Birth time:9:25 AM Gender:Female Living status:Living Apgars:9 ,9  Weight:3220 g    Magnesium Sulfate received: No BMZ received: No Rhophylac:N/A MMR:N/A T-DaP: order postpartum Flu: No RSV Vaccine  received: No Transfusion:No Immunizations administered: There is no immunization history for the selected administration types on file for this patient.  Physical exam  Vitals:   04/23/24 1931 04/23/24 2305 04/24/24 0327 04/24/24 0827  BP: 137/89 111/68 98/65 111/72  Pulse: 100 93 76 71  Resp: 18 18 18 17   Temp: 98.1 F (36.7 C) (!) 97.5 F (36.4 C) 97.7 F (36.5 C) (!) 97.5 F (36.4 C)  TempSrc: Oral Oral Oral Oral  SpO2: 99% 97% 98% 97%  Weight:      Height:       General: alert, cooperative, and no distress Lochia: appropriate Uterine Fundus: firm Incision: Healing well with no significant drainage, Dressing is clean, dry, and intact DVT Evaluation: No evidence of DVT seen on physical exam. Labs: Lab Results  Component Value Date   WBC 9.2 04/23/2024   HGB 7.7 (L) 04/23/2024   HCT 24.0 (L) 04/23/2024   MCV 75.5 (L) 04/23/2024   PLT 189 04/23/2024      Latest Ref Rng & Units 05/06/2023   12:41 AM  CMP  Glucose 70 - 99 mg/dL 782   BUN 6 - 20 mg/dL 13   Creatinine 9.56 - 1.00 mg/dL 2.13   Sodium 086 - 578 mmol/L 134   Potassium 3.5 - 5.1 mmol/L 3.6   Chloride 98 - 111 mmol/L 103   CO2 22 - 32 mmol/L 23   Calcium  8.9 - 10.3 mg/dL 8.6   Total Protein 6.5 - 8.1 g/dL 6.5   Total Bilirubin 0.3 - 1.2 mg/dL 0.3  Alkaline Phos 38 - 126 U/L 128   AST 15 - 41 U/L 22   ALT 0 - 44 U/L 14    Edinburgh Score:    04/23/2024   11:35 PM  Edinburgh Postnatal Depression Scale Screening Tool  I have been able to laugh and see the funny side of things. 0  I have looked forward with enjoyment to things. 0  I have blamed myself unnecessarily when things went wrong. 1  I have been anxious or worried for no good reason. 2  I have felt scared or panicky for no good reason. 2  Things have been getting on top of me. 1  I have been so unhappy that I have had difficulty sleeping. 0  I have felt sad or miserable. 0  I have been so unhappy that I have been crying. 0  The thought of  harming myself has occurred to me. 0  Edinburgh Postnatal Depression Scale Total 6      After visit meds:  Allergies as of 04/24/2024       Reactions   Cephalexin  Anaphylaxis   Elemental Sulfur Shortness Of Breath   Other reaction(s): Muscle Pain   Penicillins Anaphylaxis, Shortness Of Breath   Throat swelling   Sulfa  Antibiotics Anaphylaxis   Chlorhexidine  Gluconate Rash        Medication List     STOP taking these medications    acetaminophen  325 MG tablet Commonly known as: TYLENOL        TAKE these medications    alprazolam  2 MG tablet Commonly known as: XANAX  Take 2 mg by mouth 2 (two) times daily.   amphetamine -dextroamphetamine  30 MG tablet Commonly known as: ADDERALL Take 30 mg by mouth daily.   ibuprofen  600 MG tablet Commonly known as: ADVIL  Take 1 tablet (600 mg total) by mouth every 6 (six) hours.   NIFEdipine  30 MG 24 hr tablet Commonly known as: PROCARDIA -XL/NIFEDICAL-XL Take 1 tablet (30 mg total) by mouth at bedtime. Can increase to twice a day as needed for symptomatic contractions   norethindrone 0.35 MG tablet Commonly known as: Ortho Micronor Take 1 tablet (0.35 mg total) by mouth daily.   oxyCODONE -acetaminophen  5-325 MG tablet Commonly known as: Percocet Take 1 tablet by mouth every 4 (four) hours as needed for severe pain (pain score 7-10).           Discharge home in stable condition Infant Feeding: Bottle Infant Disposition:home with mother Discharge instruction: per After Visit Summary and Postpartum booklet. Activity: Advance as tolerated. Pelvic rest for 6 weeks.  Diet: routine diet Anticipated Birth Control: POPs Postpartum Appointment: Incision check in one week, office visit in weeks Additional Postpartum F/U:  N/A Future Appointments: Future Appointments  Date Time Provider Department Center  05/01/2024  2:55 PM Sofia Dunn, MD [facility] None   Follow up Visit:      04/24/2024 Phylliss Brenner, CNM

## 2024-04-24 NOTE — Discharge Instructions (Addendum)
 Discharge instructions:   Call office if you have any of the following:  headache, visual changes, fever >101.0 F, chills, breast concerns (engorgement, mastitis) excessive vaginal bleeding, incision drainage or problems, leg pain or redness, depression or any other concerns.   Activity: Do not lift > 10 lbs for 6 weeks.  No intercourse or tampons for 6 weeks.  No driving for 1-2 weeks or while taking pain medication. No strenuous activity or heavy lifting for 6 weeks.  No swimming pools, hot tubs or tub baths- showers only.    It is normal to bleed for up to 6 weeks. You should not soak through more than 1 pad in 1 hour.   Continue prenatal vitamin. Increase calories and fluids while breastfeeding.  Your milk will come in, in the next couple of days (right now it is colostrum).  You may have a slight fever when your milk comes in, but it should go away on its own.   If it does not, and rises above 101 F please call the doctor.  You will also feel achy and your breasts will be firm. They will also start to leak.  If you are breastfeeding, continue as you have been and you can pump/express milk for comfort.   For concerns about your baby, please call your pediatrician For breastfeeding concerns, the lactation consultant can be reached at [phone]  Postpartum blues (feelings of happy one minute and sad another minute) are normal for the first few weeks but if it gets worse let your doctor know.            Agency Name: [facility] Agency Address: [address] [place], [place] [address] Phone: [phone] Website: www.alamanceservices.org Service(s) Offered: Housing services, self-sufficiency, congregate meal program, and individual development account program.  Agency Name: [employer] [facility] [place] Address: [address], [place], [place] [address] Phone: [phone] Email: info@alliedchurches .org Website:  www.alliedchurches.org Service(s) Offered: Housing the homeless, feeding the hungry, Company secretary, job and education related services.  Agency Name: [facility] Address: [address], [place], [place] [address] Phone: [phone] Email: [email or web address] Service(s) Offered: Counseling, problem pregnancy, advocacy for Hispanics, limited emergency financial assistance.  Agency Name: Department of Social Services Address: [address] [place], [place] [address] Phone: [phone] Website: [email or web address] Service(s) Offered: Child support services; child welfare services; SNAP; Medicaid; work first family assistance; and aid with fuel,  rent, food and medicine.  Agency Name: [employer] [facility] Address: [address], [place], [place] [address] Phone: [phone] or [phone] Email: [email or web address] Service(s) Offered: Family services and transient assistance; emergency food, fuel, clothing, limited furniture, utilities; budget counseling, general counseling; give a kid a coat; thrift store; Christmas food and toys. Utility assistance, food pantry, rental  assistance, life sustaining medicine   Rent/Utility/Housing  Agency Name: [facility] Agency Address: [address] [place], [place] [address] Phone: [phone] Email: [email or web address] Website: www.alamanceservices.org Service(s) Offered: Housing services, self-sufficiency, congregate meal program, weatherization program, Field seismologist program, emergency food assistance,  housing counseling, home ownership program, wheels -towork program.  Agency Name: [facility] Address: [address], [place], [place] [address] Phone: [phone] (8a-4p) [phone] (8p- 10p) Email: [email or web address] Website: [email or web address].[email or web address].org Service(s) Offered: A program for homeless and/or needy  men that includes one-on-one counseling, life skills training and job rehabilitation.  Agency Name: [employer] [facility] [place] Address: [address] [place], [place] [address] Phone: [phone] Website: www.alliedchurches.org Service(s) Offered: Assistance to needy in emergency  with utility bills, heating fuel, and prescriptions. Shelter for homeless 7pm-7am. April 18, 2017 15  Agency Name: [facility] [place] [facility] Address: [address], [place], [place] [address] Phone: [phone] Contact Person: Genie Key Email: [email or web address] Website: LinkWedding.ca Service(s) Offered: Helps individuals with developmental disabilities move from housing that is more restrictive to homes where they  can achieve greater independence and have more  opportunities.  Agency Name: [facility] Address: [address], [place], [place] [address] Phone: [phone] Email: [email or web address] Website: www.burlingtonhousingauthority.org Service(s) Offered: Provides affordable housing for low-income families, elderly, and disabled individuals. Offer a wide range of  programs and services, from financial planning to afterschool and summer programs.  Agency Name: Department of Social Services Address: [address] [place], [place] [address] Phone: [phone] Service(s) Offered: Child support services; child welfare services; food stamps; Medicaid; work first family assistance; and aid with fuel,  rent, food and medicine.  Agency Name: [facility] [place] [employer] Address: [facility] [address], [place], [place]  [address] Phone: [phone] Website: www.familyabuseservices.org Service(s) Offered: 24 hour Crisis Line: [phone]; 24 hour Emergency Shelter; Transitional Housing; Support Groups; Scientist, physiological; [employer]; Hispanic Outreach: [phone];  Visitation Center: [phone].  Agency Name:  [facility]. Address: [address] [place], [place] [address] Phone: [phone] Service(s) Offered: CAP Services; Home and [employer]; Individual or Group Supports; Respite Care Non-Institutional Nursing;  Residential Supports; Respite Care and Personal Care Services; Transportation; Family and Friends Night; Recreational Activities; Three Nutritious Meals/Snacks; Consultation with Registered Dietician; Twenty-four hour Registered Nurse Access; Daily and [employer] and [employer]; Camp Green Leaves; Hamlet for the [employer] (During Summer Months) Bingo Night (Every  Wednesday Night); Special Populations Dance Night  (Every Tuesday Night); Professional Hair Care Services.  Agency Name: [facility] Address: [address], [place], [place] [address] Phone: [phone] Contact Person: Richardo Chandler Website: [email or web address].Physicist, medical) Offered: Residential treatment facility for women; food and  clothing, educational & employment development and  transportation to work; Counsellor of financial skills;  parenting and family reunification; emotional and spiritual  support; transitional housing for program graduates.  Agency Name: [facility] Address: [address] [place], [place] [address] Phone: [phone] Email: [email or web address] Website: TaskTown.es Service(s) Offered: Public housing units for elderly, disabled, and low income people; housing choice vouchers for income eligible  applicants; shelter plus care vouchers; and Psychologist, clinical.  Agency Name: [facility] [employer] Address: [address] [place], [place] [address] Phone: [phone] Email: habitat1@netzero .net Website: www.habitatalamance.org Service(s) Offered: Build houses for families in need of decent housing. Each adult in the family must invest 200 hours of labor on  someone  else's house, work with volunteers to build their own house, attend classes on budgeting, home maintenance, yard care, and attend homeowner association meetings.  Agency Name: [facility] Address: [address], [place], [place] [address] Phone: [phone] Website: www.rsli.org Service(s) Offered: Intermediate care facilities for intellectually delayed, Supervised Living in group homes for adults with developmental disabilities, Supervised Living for people who have dual diagnoses (MRMI), Independent Living, Supported Living, respite and a variety of CAP services, pre-vocational services, day supports, and [employer].  Agency Name: [place] [employer] Phone: [phone] Website: www.NCForeclosurePrevention.gov Service(s) Offered: Zero-interest, deferred loans to homeowners struggling to pay their mortgage. Call for more information.

## 2024-04-24 NOTE — Clinical Social Work Maternal (Signed)
  CLINICAL SOCIAL WORK MATERNAL/CHILD NOTE  Patient Details  Name: Lydia Kennedy MRN: [id number] Date of Birth: [birth year]  Date:  04/24/2024  Clinical Social Worker Initiating Note:  Warren Haber, RNCM Date/Time: Initiated:  04/24/24/1045     Child's Name:  Lydia Kennedy   Biological Parents:  Mother, Father   Need for Interpreter:  None   Reason for Referral:  Late or No Prenatal Care  , Current Substance Use/Substance Use During Pregnancy   (Has RX for alprazolam )   Address:  [address] [place] [address] [place] [address]    Phone number:  [phone] (home)     Additional phone number: none Household Members/Support Persons (HM/SP):   Household Member/Support Person 1   HM/SP Name Relationship DOB or Age  HM/SP -1 Lydia Kennedy. Spouse Has 2 daughters, age 36 months and 23 years of age.  HM/SP -2        HM/SP -3        HM/SP -4        HM/SP -5        HM/SP -6        HM/SP -7        HM/SP -8          Natural Supports (not living in the home):  Immediate Family, Spouse/significant other, Extended Family   Professional Supports: Other (Comment) (WIC)   Employment: Full-time   Type of Work: Investment banker, corporate   Education:  Some Materials engineer arranged:    Surveyor, quantity Resources:  [employer], Media planner    Other Resources:  [facility]   Cultural/Religious Considerations Which May Impact Care:  None identified  Strengths:  Merchandiser, retail, Ability to meet basic needs  , Home prepared for child     Psychotropic Medications: Alprazolam  2mg  PO BID PRN anxiety. RX visualized and written by Dr. Tommas Fragmin. She also takes Adderall for ADHD and see's a psychiatrist.         Pediatrician:    Rollen Clines  Pediatrician List:   [facility] (Dr. Hedda Liv)  [facility]      Pediatrician Fax Number:    Risk Factors/Current Problems:   Other (Comment) (She asked for resources to help pay utilities and rent. Resources uploaded into AVS.)   Cognitive State:  Alert  , Goal Oriented  , Insightful  , Poor Judgement     Mood/Affect:  Bright  , Calm  , Comfortable  , Happy  , Relaxed  , Interested     CSW Assessment: Completed  CSW Plan/Description:  Sudden Infant Death Syndrome (SIDS) Education, Perinatal Mood and Anxiety Disorder (PMADs) Education, Hospital Drug Screen Policy Information, Other Information/Referral to [employer] E Fantashia Shupert, RN 04/24/2024, 11:39 AM

## 2024-04-27 ENCOUNTER — Encounter: Payer: Self-pay | Admitting: Obstetrics

## 2024-04-27 NOTE — Telephone Encounter (Signed)
 Patient called requesting more oxycodone . Please advise

## 2024-04-27 NOTE — Telephone Encounter (Signed)
 Please advise on refill on pain meds.

## 2024-04-28 ENCOUNTER — Telehealth: Payer: Self-pay

## 2024-04-28 ENCOUNTER — Other Ambulatory Visit: Payer: Self-pay | Admitting: Obstetrics

## 2024-04-28 MED ORDER — LIDOCAINE 5 % EX PTCH: 1.0000 | MEDICATED_PATCH | CUTANEOUS | 0 refills | Status: DC

## 2024-04-28 MED ORDER — OXYCODONE HCL 5 MG PO TABS: 5.0000 mg | ORAL_TABLET | Freq: Four times a day (QID) | ORAL | 0 refills | Status: DC | PRN

## 2024-04-28 NOTE — Telephone Encounter (Signed)
 Left message to notify patient.

## 2024-04-28 NOTE — Progress Notes (Signed)
 Spoke to [place] on phone regarding pain management. She reports that her incision pain is still 7-8 on POD6. She has taken all the Percocet that was prescribed for her postpartum. She denies s/s of infection or dehiscence.  Instructed to take alternating ibuprofen  and Tylenol  on a schedule. Small rx for oxycodone  for breakthrough pain sent. Rx for lidocaine  patch sent. Incision check 05/01/24. Instructed to seek medical attention for s/s of infection or if pain is unmanageable.  Kamron Portee M Zala Degrasse, CNM

## 2024-04-28 NOTE — Telephone Encounter (Signed)
 Patient called again 3rd call. Requesting medication for pain. Please advise.

## 2024-04-28 NOTE — Telephone Encounter (Signed)
 See CNM Aisha Ali note, has been given additional Rx

## 2024-04-28 NOTE — Telephone Encounter (Signed)
 The number to initiate prior authorization is a scam. [facility] has been notified.

## 2024-04-30 NOTE — Progress Notes (Signed)
   Postoperative Cesarean Follow-up Visit Lydia Kennedy is a 33 y.o. G3P3003 s/p rLTCS at [redacted]w[redacted]d for history of previous cesarean section x 2 and no prenatal care. POD#9, here today for incision check.  Subjective: Pain is controlled with current analgesics. Medications being used: acetaminophen  and ibuprofen  (OTC). She denies fever, chills, nausea, and vomiting. Eating a regular diet without difficulty.  Is having regular bowel movements. Activity: normal activities of daily living. Bleeding is less than menses. She denies issues with her incision.  Sore on left side sharp pain.    Objective: BP 127/76   Pulse 98   Ht 5\' 5"  (1.651 m)   Wt 176 lb (79.8 kg)   Breastfeeding No   BMI 29.29 kg/m  Body mass index is 29.29 kg/m.  General:  alert and no distress  Abdomen: soft, bowel sounds active, non-tender  Incision:   healing well, no drainage, no erythema, no hernia, no seroma, no swelling, no dehiscence, incision well approximated    Assessment/Plan: [name] is a 33 y.o. G3P3003 s/p rLTCS at [redacted]w[redacted]d for history of previous cesarean section x 2 and no prenatal care. POD#9, here today for incision check. healing well. No concerns with incision today, remaining steri-strips removed and replaced.  -Discussed at-home care, healing expectations, si/sx of infection.  -Call clinic if developing redness, discharge, or increasing pain. -Avoid vigorous scrubbing/washing of incision site; hygiene reviewed. -May trim back steri-strips if they peel; should fully remove after 1 week from today. -May resume driving and light walking. Still no heavy lifting >10-12 lbs and pelvic rest advised until cleared at 6wk postpartum visit.  -If stooling regularly x 1-2 weeks, can take stool softener every other day x 1 week, taper as tolerated.  Return in about 5 weeks (around 06/05/2024) for 6w PPV.   Sofia Dunn, DO [facility]

## 2024-05-01 ENCOUNTER — Encounter: Payer: Self-pay | Admitting: Obstetrics

## 2024-05-01 ENCOUNTER — Ambulatory Visit (INDEPENDENT_AMBULATORY_CARE_PROVIDER_SITE_OTHER): Admitting: Obstetrics

## 2024-05-11 ENCOUNTER — Other Ambulatory Visit: Payer: Self-pay

## 2024-05-11 ENCOUNTER — Encounter: Payer: Self-pay | Admitting: Obstetrics

## 2024-05-11 DIAGNOSIS — M25461 Effusion, right knee: Secondary | ICD-10-CM | POA: Insufficient documentation

## 2024-05-11 DIAGNOSIS — M25562 Pain in left knee: Secondary | ICD-10-CM | POA: Diagnosis present

## 2024-05-11 DIAGNOSIS — M25462 Effusion, left knee: Secondary | ICD-10-CM | POA: Insufficient documentation

## 2024-05-11 DIAGNOSIS — Z5321 Procedure and treatment not carried out due to patient leaving prior to being seen by health care provider: Secondary | ICD-10-CM | POA: Diagnosis not present

## 2024-05-11 DIAGNOSIS — M25561 Pain in right knee: Secondary | ICD-10-CM | POA: Insufficient documentation

## 2024-05-11 DIAGNOSIS — M25551 Pain in right hip: Secondary | ICD-10-CM | POA: Insufficient documentation

## 2024-05-11 LAB — URINALYSIS, ROUTINE W REFLEX MICROSCOPIC
Bilirubin Urine: NEGATIVE
Glucose, UA: NEGATIVE mg/dL
Ketones, ur: NEGATIVE mg/dL
Nitrite: NEGATIVE
Protein, ur: NEGATIVE mg/dL
Specific Gravity, Urine: 1.014 (ref 1.005–1.030)
pH: 5 (ref 5.0–8.0)

## 2024-05-11 NOTE — ED Triage Notes (Signed)
 Pt presents via POV c/o bilateral knee swelling and pain in bilateral knees. Left > right. Also reports knees are "hot to touch". Also reports swelling in feet and pain in right hip while trying to sleep on her side.   Pt is 2 weeks post-partum.

## 2024-05-12 ENCOUNTER — Emergency Department
Admission: EM | Admit: 2024-05-12 | Discharge: 2024-05-12 | Discharge status: Against Medical Advice | Attending: Emergency Medicine | Admitting: Emergency Medicine

## 2024-05-12 NOTE — ED Notes (Signed)
 No answer when called several times from lobby

## 2024-05-12 NOTE — Telephone Encounter (Signed)
 Can she have anything for the pain?

## 2024-05-27 ENCOUNTER — Other Ambulatory Visit: Payer: Self-pay | Admitting: Physician Assistant

## 2024-05-27 DIAGNOSIS — M7989 Other specified soft tissue disorders: Secondary | ICD-10-CM

## 2024-06-03 ENCOUNTER — Telehealth: Payer: Self-pay

## 2024-06-03 NOTE — Telephone Encounter (Signed)
 error

## 2024-06-04 ENCOUNTER — Telehealth: Payer: Self-pay | Admitting: Obstetrics

## 2024-06-04 ENCOUNTER — Ambulatory Visit: Admitting: Obstetrics

## 2024-06-04 NOTE — Telephone Encounter (Signed)
 Reached out to pt to reschedule 6 week pp visit that was scheduled on 06/04/2024 at 2:35 with Dr. Dell Fennel.  Left message for pt to call back.

## 2024-06-04 NOTE — Progress Notes (Deleted)
   OBSTETRICS POSTPARTUM CLINIC PROGRESS NOTE  Subjective:     Lydia Kennedy is a 33 y.o. G74P3003 female who presents for a postpartum visit. She is 6 week postpartum following a low cervical transverse Cesarean section. I have reviewed the prenatal and intrapartum course. The delivery was at 40 gestational weeks 2 days.  Anesthesia: spinal. Postpartum course has been ***. Baby's course has been ***. Baby is feeding by {breast/bottle:69}. Bleeding: patient {HAS HAS FAO:13086} not resumed menses, with No LMP recorded.. Bowel function is {normal:32111}. Bladder function is {normal:32111}. Patient {is/is not:9024} sexually active. Contraception method desired is {contraceptive method:5051}. Postpartum depression screening: {neg default:13464::negative}.  EDPS score is ***.    The following portions of the patient's history were reviewed and updated as appropriate: allergies, current medications, past family history, past medical history, past social history, past surgical history, and problem list.  Review of Systems {ros; complete:30496}   Objective:    There were no vitals taken for this visit.  General:  alert and no distress   Breasts:  inspection negative, no nipple discharge or bleeding, no masses or nodularity palpable  Lungs: clear to auscultation bilaterally  Heart:  regular rate and rhythm, S1, S2 normal, no murmur, click, rub or gallop  Abdomen: soft, non-tender; bowel sounds normal; no masses,  no organomegaly.  ***Well healed Pfannenstiel incision   Vulva:  normal  Vagina: normal vagina, no discharge, exudate, lesion, or erythema  Cervix:  no cervical motion tenderness and no lesions  Corpus: normal size, contour, position, consistency, mobility, non-tender  Adnexa:  normal adnexa and no mass, fullness, tenderness  Rectal Exam: Not performed.         Labs:  Lab Results  Component Value Date   HGB 7.7 (L) 04/23/2024     Assessment:   No diagnosis found.    Plan:    1. Contraception: {method:5051} 2. Will check Hgb for h/o postpartum anemia of less than 10.  3. Follow up in: {1-10:13787} {time; units:19136} or as needed.    Rian Chafe, CMA [facility]

## 2024-06-05 ENCOUNTER — Encounter: Payer: Self-pay | Admitting: Obstetrics

## 2024-06-05 NOTE — Telephone Encounter (Signed)
 Reached out to pt (2x) to reschedule 6 week pp visit that was scheduled on 06/04/2024 at  2:35 with Dr. Dell Fennel.  Left message for pt to call back.  Will send a MyChart letter.

## 2024-08-25 ENCOUNTER — Telehealth: Payer: Self-pay

## 2024-08-25 NOTE — Telephone Encounter (Signed)
 I left voicemail for patient asking her to please complete her new patient information via MyChart prior to check-in for her appointment with Rollene Northern, FNP, on 08/27/2024.  I let patient know that she may pick up a packet from our office or arrive for her appointment super early if she would prefer to complete a paper copy.  I also sent a message to patient via MyChart.

## 2024-08-27 ENCOUNTER — Ambulatory Visit: Admitting: Family

## 2024-09-25 ENCOUNTER — Other Ambulatory Visit: Payer: Self-pay | Admitting: Licensed Practical Nurse

## 2024-09-25 ENCOUNTER — Other Ambulatory Visit: Payer: Self-pay

## 2024-09-25 DIAGNOSIS — I10 Essential (primary) hypertension: Secondary | ICD-10-CM

## 2024-09-25 MED ORDER — NIFEDIPINE ER OSMOTIC RELEASE 30 MG PO TB24: 30.0000 mg | ORAL_TABLET | Freq: Every day | ORAL | 1 refills | Status: AC

## 2024-09-25 NOTE — Progress Notes (Signed)
 Patient states she will be out of her blood pressure medication this weekend.  She did not attend her 6 week postpartum appointment and only had one prenatal appointment and an incision check with us  in the last year.  Advised we will not refill until she makes an appointment for an annual exam.  Appointment made for 11/02/24 with [facility].  30 day rx with one refill authorized until patient can be seen.  She will not be eligible for any future refills if she does not attend this appointment.

## 2024-11-02 ENCOUNTER — Ambulatory Visit: Admitting: Licensed Practical Nurse

## 2024-11-24 ENCOUNTER — Other Ambulatory Visit: Payer: Self-pay | Admitting: Licensed Practical Nurse

## 2024-11-24 DIAGNOSIS — I10 Essential (primary) hypertension: Secondary | ICD-10-CM

## 2024-12-06 ENCOUNTER — Other Ambulatory Visit: Payer: Self-pay | Admitting: Licensed Practical Nurse

## 2024-12-06 DIAGNOSIS — I10 Essential (primary) hypertension: Secondary | ICD-10-CM

## 2024-12-07 ENCOUNTER — Telehealth: Payer: Self-pay

## 2024-12-07 NOTE — Telephone Encounter (Signed)
 Patient requesting refill of BP meds.  Patient has not been seen since May 2025 for an incision check and attended only one prenatal appointment.  She no-showed her postpartum appointment 6/12 and was advised on 10/3 that we will be unable to refill her medications if she does not have an annual exam.  She no-showed her annual appointment 11/10.  She will need to come to an appointment to receive any further refills.

## 2024-12-11 ENCOUNTER — Telehealth: Payer: Self-pay

## 2024-12-11 NOTE — Telephone Encounter (Signed)
 Patient calling to get refills again. She has continuously no showed for appointments. She will be without medication until January 2, she has scheduled another appointment. She states that she cannot go without her medications. Please advise.

## 2024-12-11 NOTE — Telephone Encounter (Signed)
 Patient called triage stating she's out of her blood pressure medication, She's no showed her last several visits. Last visit was with Dr. Leigh. She wants to know if we can refill her blood pressure medication. Please reach out to the patient.

## 2024-12-14 ENCOUNTER — Telehealth: Admitting: Physician Assistant

## 2024-12-14 ENCOUNTER — Other Ambulatory Visit: Payer: Self-pay | Admitting: Licensed Practical Nurse

## 2024-12-14 DIAGNOSIS — J069 Acute upper respiratory infection, unspecified: Secondary | ICD-10-CM

## 2024-12-14 DIAGNOSIS — I10 Essential (primary) hypertension: Secondary | ICD-10-CM

## 2024-12-14 MED ORDER — FLUTICASONE PROPIONATE 50 MCG/ACT NA SUSP: 2.0000 | Freq: Every day | NASAL | 0 refills | Status: DC

## 2024-12-14 MED ORDER — BENZONATATE 100 MG PO CAPS: 100.0000 mg | ORAL_CAPSULE | Freq: Three times a day (TID) | ORAL | 0 refills | Status: DC | PRN

## 2024-12-14 NOTE — Progress Notes (Signed)

## 2024-12-15 NOTE — Telephone Encounter (Signed)
 SABRA

## 2024-12-22 ENCOUNTER — Telehealth

## 2024-12-22 ENCOUNTER — Ambulatory Visit (INDEPENDENT_AMBULATORY_CARE_PROVIDER_SITE_OTHER): Admitting: Family Medicine

## 2024-12-22 ENCOUNTER — Encounter: Payer: Self-pay | Admitting: Family Medicine

## 2024-12-22 VITALS — BP 115/71 | HR 85 | Temp 97.8°F | Ht 65.0 in | Wt 165.4 lb

## 2024-12-22 DIAGNOSIS — D649 Anemia, unspecified: Secondary | ICD-10-CM | POA: Diagnosis not present

## 2024-12-22 DIAGNOSIS — J301 Allergic rhinitis due to pollen: Secondary | ICD-10-CM

## 2024-12-22 DIAGNOSIS — E010 Iodine-deficiency related diffuse (endemic) goiter: Secondary | ICD-10-CM

## 2024-12-22 DIAGNOSIS — E041 Nontoxic single thyroid nodule: Secondary | ICD-10-CM

## 2024-12-22 DIAGNOSIS — Z0001 Encounter for general adult medical examination with abnormal findings: Secondary | ICD-10-CM

## 2024-12-22 DIAGNOSIS — Z136 Encounter for screening for cardiovascular disorders: Secondary | ICD-10-CM | POA: Diagnosis not present

## 2024-12-22 DIAGNOSIS — I10 Essential (primary) hypertension: Secondary | ICD-10-CM

## 2024-12-22 DIAGNOSIS — Z0000 Encounter for general adult medical examination without abnormal findings: Secondary | ICD-10-CM

## 2024-12-22 MED ORDER — LISINOPRIL 10 MG PO TABS: 10.0000 mg | ORAL_TABLET | Freq: Every day | ORAL | 3 refills | Status: AC

## 2024-12-22 MED ORDER — CETIRIZINE HCL 10 MG PO TABS: 10.0000 mg | ORAL_TABLET | Freq: Every day | ORAL | 3 refills | Status: AC

## 2024-12-22 NOTE — Progress Notes (Signed)
 "   New patient visit   Patient: Lydia Kennedy   DOB: [birth year]   33 y.o. Female  MRN: [id number] Visit Date: 12/22/2024  Today's healthcare provider: LAURAINE LOISE BUOY, DO   Chief Complaint  Patient presents with   New Patient (Initial Visit)    Patient is here to establish care with a primary provider states that she is here to get a refill on her blood pressure medication.  Took her last pill yesterday.  All vaccines declined    Cervical Cancer Screening- [facility] not sure if it was in the last year   Subjective    Lydia Kennedy is a 33 y.o. female who presents today as a new patient to establish care.  HPI HPI     New Patient (Initial Visit)    Additional comments: Patient is here to establish care with a primary provider states that she is here to get a refill on her blood pressure medication.  Took her last pill yesterday.  All vaccines declined    Cervical Cancer Screening- [facility] not sure if it was in the last year      Last edited by Toombs, Heather  L, CMA on 12/22/2024  9:56 AM.       Lydia Kennedy is a 33 year old female who presents for an establishment of care visit.  She is currently taking nifedipine  for hypertension, which was switched from lisinopril due to pregnancy. She experiences no side effects from nifedipine  but feels lisinopril was more effective. She monitors her blood pressure at home.  She has a history of a thyroid nodule, last evaluated with ultrasound approximately five years ago. The nodule was described as the size of a blueberry and was not biopsied due to its location.  She experiences reflux and has tried various medications including omeprazole and pantoprazole, with Dexilant  being the most effective but unaffordable. She reports intermittent symptoms and has had an endoscopy in the past.  She has anxiety and takes alprazolam  for it, prescribed by her doctor in [place]. She has been on Adderall for  approximately thirteen years. She reports occasional headaches and palpitations associated with anxiety.  She experienced significant blood loss during her delivery in April 2025 and received an iron  infusion afterwards. She has a history of anemia, which was managed with iron  supplements in the past.  She reports congestion and pressure, particularly during winter, and has previously taken cetirizine for allergies. She currently experiences congestion and pressure in her eyes and ears.    Past Medical History:  Diagnosis Date   Acute gastritis without hemorrhage    ADHD    Allergy    Anemia    Anxiety    Chronic kidney disease    kidney reflux, problem more as child/ recuurent interstitial cystitis   Essential hypertension    Family history of breast cancer    7/21 cancer genetic testing letter sent   GERD (gastroesophageal reflux disease)    Headache    daily   History of cesarean delivery affecting pregnancy 11/04/2022   IC (interstitial cystitis)    Infection    UTI   Labor and delivery, indication for care 03/29/2023   Maternal chronic hypertension, third trimester 05/01/2023   Pregnancy 05/13/2023   Supervision of high-risk pregnancy 10/12/2022              Clinical Staff    Provider      Office Location     [facility]  Dating     Not found.      Language     english    Anatomy US             Flu Vaccine     Declined    Genetic Screen     NIPS: Negative/Female: 10/19/22      TDaP vaccine      Declined       Hgb A1C or   GTT    Early :  Third trimester : 121 (03/06/2023)      Covid    Declined          LAB RESULTS       Rhogam     A/P   Thyroid dysfunction    Thyroid nodule    Tonsillitis    Past Surgical History:  Procedure Laterality Date   BREAST ENHANCEMENT SURGERY Bilateral 2016   CESAREAN SECTION  2012   CESAREAN SECTION N/A 05/13/2023   Procedure: REPEAT CESAREAN SECTION;  Surgeon: Janit Alm Agent, MD;  Location: [facility];  Service: Obstetrics;   Laterality: N/A;   CESAREAN SECTION N/A 04/22/2024   Procedure: CESAREAN DELIVERY;  Surgeon: Leigh Sober, MD;  Location: [facility];  Service: Obstetrics;  Laterality: N/A;  Repeat C-Section   ESOPHAGOGASTRODUODENOSCOPY (EGD) WITH PROPOFOL  N/A 07/07/2018   Procedure: ESOPHAGOGASTRODUODENOSCOPY (EGD) WITH PROPOFOL ;  Surgeon: Jinny Carmine, MD;  Location: [facility];  Service: Endoscopy;  Laterality: N/A;   TONSILLECTOMY N/A 01/29/2018   Procedure: TONSILLECTOMY;  Surgeon: Milissa Hamming, MD;  Location: [facility];  Service: ENT;  Laterality: N/A;  UPREG   WISDOM TOOTH EXTRACTION     four; 2018   Family Status  Relation Name Status   Mother  Alive   Father  Alive   Brother  Alive   MGM  Deceased   MGF  Deceased   PGM  Deceased   PGF  Deceased  No partnership data on file   Family History  Problem Relation Age of Onset   Breast cancer Mother 42   Hypertension Father    Healthy Brother    Pancreatic cancer Maternal Grandmother 71   Healthy Maternal Grandfather    Healthy Paternal Grandmother    Heart attack Paternal Grandfather    Social History   Socioeconomic History   Marital status: Married    Spouse name: Merchant Navy Officer   Number of children: 2   Years of education: 14   Highest education level: Some college, no degree  Occupational History   Occupation: Investment Banker, Corporate    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  Tobacco Use   Smoking status: Former    Types: E-cigarettes   Smokeless tobacco: Never  Advertising Account Planner   Vaping status: Some Days   Substances: Flavoring  Substance and Sexual Activity   Alcohol use: Never    Alcohol/week: 3.0 standard drinks of alcohol    Types: 3 Standard drinks or equivalent per week    Comment: occ   Drug use: Never   Sexual activity: Yes    Partners: Male    Birth control/protection: Pill, None  Other Topics Concern   Not on file  Social History Narrative   Not on file   Social Drivers of Health   Tobacco Use: Medium Risk  (12/22/2024)   Patient History    Smoking Tobacco Use: Former    Smokeless Tobacco Use: Never    Passive Exposure: Not on file  Financial Resource Strain: Medium Risk (12/21/2024)   Overall Financial Resource Strain (CARDIA)  Difficulty of Paying Living Expenses: Somewhat hard  Food Insecurity: No Food Insecurity (12/21/2024)   Epic    Worried About Programme Researcher, Broadcasting/film/video in the Last Year: Never true    Ran Out of Food in the Last Year: Never true  Transportation Needs: No Transportation Needs (12/21/2024)   Epic    Lack of Transportation (Medical): No    Lack of Transportation (Non-Medical): No  Physical Activity: Insufficiently Active (12/21/2024)   Exercise Vital Sign    Days of Exercise per Week: 3 days    Minutes of Exercise per Session: 30 min  Stress: No Stress Concern Present (12/05/2023)   [employer] of Occupational Health - Occupational Stress Questionnaire    Feeling of Stress : Not at all  Social Connections: Moderately Integrated (12/21/2024)   Social Connection and Isolation Panel    Frequency of Communication with Friends and Family: More than three times a week    Frequency of Social Gatherings with Friends and Family: Twice a week    Attends Religious Services: 1 to 4 times per year    Active Member of [employer] or Organizations: No    Attends Engineer, Structural: Not on file    Marital Status: Married  Depression (PHQ2-9): Low Risk (12/22/2024)   Depression (PHQ2-9)    PHQ-2 Score: 1  Alcohol Screen: Not on file  Housing: Low Risk (12/21/2024)   Epic    Unable to Pay for Housing in the Last Year: No    Number of Times Moved in the Last Year: 0    Homeless in the Last Year: No  Utilities: Not At Risk (04/22/2024)   [facility] Utilities    Threatened with loss of utilities: No  Health Literacy: Adequate Health Literacy (12/05/2023)   B1300 Health Literacy    Frequency of need for help with medical instructions: Never   Show/hide medication  list[1] Allergies[2]  There is no immunization history for the selected administration types on file for this patient.  Health Maintenance  Topic Date Due   Cervical Cancer Screening (HPV/Pap Cotest)  07/07/2023   COVID-19 Vaccine (1 - 2025-26 season) 01/07/2025 (Originally 08/24/2024)   Influenza Vaccine  03/23/2025 (Originally 07/24/2024)   DTaP/Tdap/Td (1 - Tdap) 12/22/2025 (Originally 02/19/2010)   HPV VACCINES (1 - 3-dose SCDM series) 12/22/2025 (Originally 02/19/2018)   Hepatitis C Screening  Completed   HIV Screening  Completed   Pneumococcal Vaccine  Aged Out   Meningococcal B Vaccine  Aged Out   Hepatitis B Vaccines 19-59 Average Risk  Discontinued    Patient Care Team: Messiah Rovira, Lauraine SAILOR, DO as PCP - General (Family Medicine)  Review of Systems  Constitutional:  Negative for chills, fatigue and fever.  HENT:  Negative for congestion, ear pain, rhinorrhea, sneezing and sore throat.   Eyes: Negative.  Negative for pain and redness.  Respiratory:  Negative for cough, shortness of breath and wheezing.   Cardiovascular:  Negative for chest pain and leg swelling.  Gastrointestinal:  Negative for abdominal pain, blood in stool, constipation, diarrhea and nausea.  Endocrine: Negative for polydipsia and polyphagia.  Genitourinary: Negative.  Negative for dysuria, flank pain, hematuria, pelvic pain, vaginal bleeding and vaginal discharge.  Musculoskeletal:  Negative for arthralgias, back pain, gait problem and joint swelling.  Skin:  Negative for rash.  Neurological: Negative.  Negative for dizziness, tremors, seizures, weakness, light-headedness, numbness and headaches.  Hematological:  Negative for adenopathy.  Psychiatric/Behavioral: Negative.  Negative for behavioral problems, confusion and dysphoric mood. The  patient is not nervous/anxious and is not hyperactive.         Objective    BP 115/71 (BP Location: Left Arm, Patient Position: Sitting, Cuff Size: Normal)   Pulse 85    Temp 97.8 F (36.6 C) (Oral)   Ht 5' 5 (1.651 m)   Wt 165 lb 6.4 oz (75 kg)   SpO2 100%   BMI 27.52 kg/m     Physical Exam Vitals and nursing note reviewed.  Constitutional:      General: She is awake.     Appearance: Normal appearance.  HENT:     Head: Normocephalic and atraumatic.     Right Ear: Ear canal and external ear normal. A middle ear effusion (Mild, clear fluid) is present.     Left Ear: Ear canal and external ear normal. A middle ear effusion (Mild, clear fluid) is present.     Nose: Nose normal.     Mouth/Throat:     Mouth: Mucous membranes are moist.     Pharynx: Oropharynx is clear. No oropharyngeal exudate or posterior oropharyngeal erythema.  Eyes:     General: No scleral icterus.    Extraocular Movements: Extraocular movements intact.     Conjunctiva/sclera: Conjunctivae normal.     Pupils: Pupils are equal, round, and reactive to light.  Neck:     Thyroid: Thyromegaly present. No thyroid tenderness.  Cardiovascular:     Rate and Rhythm: Normal rate and regular rhythm.     Pulses: Normal pulses.     Heart sounds: Normal heart sounds.  Pulmonary:     Effort: Pulmonary effort is normal. No tachypnea, bradypnea or respiratory distress.     Breath sounds: Normal breath sounds. No stridor. No wheezing, rhonchi or rales.  Abdominal:     General: Bowel sounds are normal. There is no distension.     Palpations: Abdomen is soft. There is no mass.     Tenderness: There is no abdominal tenderness. There is no guarding.     Hernia: No hernia is present.  Musculoskeletal:     Cervical back: Normal range of motion and neck supple.     Right lower leg: No edema.     Left lower leg: No edema.  Lymphadenopathy:     Cervical: No cervical adenopathy.  Skin:    General: Skin is warm and dry.  Neurological:     Mental Status: She is alert and oriented to person, place, and time. Mental status is at baseline.  Psychiatric:        Mood and Affect: Mood normal.         Behavior: Behavior normal.     Depression Screen    12/22/2024   10:04 AM 11/22/2022    3:15 PM 05/04/2016    2:23 PM  PHQ 2/9 Scores  PHQ - 2 Score 0 2 3  PHQ- 9 Score 1 6  14       Data saved with a previous flowsheet row definition   No results found for any visits on 12/22/24.  Assessment & Plan     Encounter for medical examination to establish care  Essential hypertension -     Lisinopril; Take 1 tablet (10 mg total) by mouth daily.  Dispense: 90 tablet; Refill: 3 -     Comprehensive metabolic panel with GFR  Normocytic anemia -     CBC  Thyromegaly -     TSH Rfx on Abnormal to Free T4 -     US  THYROID;  Future  Thyroid nodule -     US  THYROID; Future  Allergic rhinitis due to pollen, unspecified seasonality -     Cetirizine HCl; Take 1 tablet (10 mg total) by mouth daily.  Dispense: 90 tablet; Refill: 3  Encounter for screening for cardiovascular disorders -     Lipid panel     Encounter for medical examination to establish care Physical exam overall unremarkable except as noted above. Routine lab work ordered as noted. Routine health maintenance discussed. No current need for vaccines. Pap smear results pending. - Reviewed Pap smear results when available. - Ordered fasting blood work for general health assessment.  Essential hypertension Chronic, stable on nifedipine  due to previous pregnancy. No side effects reported. Blood pressure monitored at home.  Patient prefers to switch back to lisinopril. - Stop nifedipine   - Start lisinopril 10 mg daily - Monitor blood pressure at home regularly.  Thyromegaly; thyroid nodule Previously evaluated with ultrasound. No biopsy due to nodule location.  Thyromegaly noted on exam. - Ordered thyroid ultrasound to reassess current status of nodule.  Normocytic anemia Anemia during pregnancy as well as before pregnancy, managed with iron  supplementation.  Iron  infusion required after delivery due to significant blood  loss at the time.  Previous anemia resolved. - Ordered fasting blood work to assess current anemia status.  Gastroesophageal reflux disease Intermittent reflux symptoms. Previous treatment with Dexilant  effective but cost-prohibitive. Omeprazole provided some benefit. - Consider over-the-counter options for reflux management.  Allergic rhinitis due to pollen, unspecified seasonality Intermittent congestion and pressure likely due to allergies. Symptoms exacerbated by environmental factors. - Prescribed cetirizine for allergic rhinitis. - Advised to take cetirizine nightly for 1-2 weeks, then adjust as needed.    Return in about 6 months (around 06/22/2025) for HTN, Chronic f/u.     I discussed the assessment and treatment plan with the patient  The patient was provided an opportunity to ask questions and all were answered. The patient agreed with the plan and demonstrated an understanding of the instructions.   The patient was advised to call back or seek an in-person evaluation if the symptoms worsen or if the condition fails to improve as anticipated.    LAURAINE LOISE BUOY, DO  [facility] [phone] (phone) [phone] (fax)  [facility]    [1]  Outpatient Medications Prior to Visit  Medication Sig   alprazolam  (XANAX ) 2 MG tablet Take 2 mg by mouth 2 (two) times daily.   amphetamine -dextroamphetamine  (ADDERALL) 30 MG tablet Take 30 mg by mouth daily.   NIFEdipine  (PROCARDIA -XL/NIFEDICAL-XL) 30 MG 24 hr tablet Take 1 tablet (30 mg total) by mouth at bedtime. Can increase to twice a day as needed for symptomatic contractions   norethindrone  (ORTHO MICRONOR ) 0.35 MG tablet Take 1 tablet (0.35 mg total) by mouth daily.   [DISCONTINUED] benzonatate  (TESSALON ) 100 MG capsule Take 1-2 capsules (100-200 mg total) by mouth 3 (three) times daily as needed.   [DISCONTINUED] ferrous sulfate  325 (65 FE) MG EC tablet Take 1 tablet (325 mg total) by  mouth daily with breakfast.   [DISCONTINUED] fluticasone  (FLONASE ) 50 MCG/ACT nasal spray Place 2 sprays into both nostrils daily.   [DISCONTINUED] ibuprofen  (ADVIL ) 600 MG tablet Take 1 tablet (600 mg total) by mouth every 6 (six) hours.   [DISCONTINUED] lidocaine  (LIDODERM ) 5 % Place 1 patch onto the skin daily. Remove & Discard patch within 12 hours or as directed by MD   [DISCONTINUED] oxyCODONE  (ROXICODONE ) 5 MG  immediate release tablet Take 1 tablet (5 mg total) by mouth every 6 (six) hours as needed for severe pain (pain score 7-10). (Patient not taking: Reported on 05/01/2024)   [DISCONTINUED] oxyCODONE -acetaminophen  (PERCOCET) 5-325 MG tablet Take 1 tablet by mouth every 4 (four) hours as needed for severe pain (pain score 7-10). (Patient not taking: Reported on 05/01/2024)   No facility-administered medications prior to visit.  [2]  Allergies Allergen Reactions   Cephalexin  Anaphylaxis   Elemental Sulfur Shortness Of Breath    Other reaction(s): Muscle Pain   Penicillins Anaphylaxis and Shortness Of Breath    Throat swelling   Sulfa  Antibiotics Anaphylaxis   Chlorhexidine  Gluconate Rash   "

## 2024-12-25 ENCOUNTER — Ambulatory Visit: Admitting: Licensed Practical Nurse

## 2024-12-28 NOTE — Telephone Encounter (Signed)
 Saw PCP on 12/22/24.

## 2025-05-27 ENCOUNTER — Ambulatory Visit: Admitting: Family
